# Patient Record
Sex: Male | Born: 1935 | Race: White | Hispanic: No | Smoking: Former smoker
Health system: Southern US, Community
[De-identification: ages and names within clinical notes are randomized; demographics above are authoritative.]

## PROBLEM LIST (undated history)

## (undated) DIAGNOSIS — K5733 Diverticulitis of large intestine without perforation or abscess with bleeding: Secondary | ICD-10-CM

## (undated) DIAGNOSIS — K922 Gastrointestinal hemorrhage, unspecified: Secondary | ICD-10-CM

## (undated) DIAGNOSIS — K219 Gastro-esophageal reflux disease without esophagitis: Secondary | ICD-10-CM

## (undated) DIAGNOSIS — N189 Chronic kidney disease, unspecified: Secondary | ICD-10-CM

## (undated) DIAGNOSIS — N183 Chronic kidney disease, stage 3 unspecified: Secondary | ICD-10-CM

## (undated) DIAGNOSIS — I2510 Atherosclerotic heart disease of native coronary artery without angina pectoris: Secondary | ICD-10-CM

## (undated) DIAGNOSIS — N400 Enlarged prostate without lower urinary tract symptoms: Secondary | ICD-10-CM

## (undated) DIAGNOSIS — J690 Pneumonitis due to inhalation of food and vomit: Secondary | ICD-10-CM

## (undated) DIAGNOSIS — N184 Chronic kidney disease, stage 4 (severe): Secondary | ICD-10-CM

## (undated) DIAGNOSIS — E785 Hyperlipidemia, unspecified: Secondary | ICD-10-CM

## (undated) DIAGNOSIS — J449 Chronic obstructive pulmonary disease, unspecified: Secondary | ICD-10-CM

## (undated) DIAGNOSIS — I219 Acute myocardial infarction, unspecified: Secondary | ICD-10-CM

## (undated) DIAGNOSIS — I2699 Other pulmonary embolism without acute cor pulmonale: Secondary | ICD-10-CM

## (undated) DIAGNOSIS — J8410 Pulmonary fibrosis, unspecified: Secondary | ICD-10-CM

## (undated) DIAGNOSIS — R1312 Dysphagia, oropharyngeal phase: Secondary | ICD-10-CM

## (undated) DIAGNOSIS — Z905 Acquired absence of kidney: Secondary | ICD-10-CM

## (undated) DIAGNOSIS — N179 Acute kidney failure, unspecified: Secondary | ICD-10-CM

## (undated) DIAGNOSIS — F329 Major depressive disorder, single episode, unspecified: Secondary | ICD-10-CM

## (undated) DIAGNOSIS — F32A Depression, unspecified: Secondary | ICD-10-CM

## (undated) DIAGNOSIS — D62 Acute posthemorrhagic anemia: Secondary | ICD-10-CM

## (undated) DIAGNOSIS — M75101 Unspecified rotator cuff tear or rupture of right shoulder, not specified as traumatic: Secondary | ICD-10-CM

## (undated) HISTORY — DX: Chronic kidney disease, stage 4 (severe): N18.4

## (undated) HISTORY — PX: TONSILLECTOMY AND ADENOIDECTOMY: SHX28

## (undated) HISTORY — DX: Acquired absence of kidney: Z90.5

## (undated) HISTORY — PX: SHOULDER SURGERY: SHX246

## (undated) HISTORY — PX: APPENDECTOMY: SHX54

## (undated) HISTORY — PX: NEPHRECTOMY: SHX65

## (undated) HISTORY — PX: CIRCUMCISION: SUR203

## (undated) HISTORY — PX: KNEE SURGERY: SHX244

## (undated) HISTORY — PX: INGUINAL HERNIA REPAIR: SUR1180

## (undated) HISTORY — DX: Gastrointestinal hemorrhage, unspecified: K92.2

## (undated) HISTORY — DX: Pulmonary fibrosis, unspecified: J84.10

## (undated) HISTORY — DX: Diverticulitis of large intestine without perforation or abscess with bleeding: K57.33

## (undated) HISTORY — PX: COLONOSCOPY: SHX174

## (undated) HISTORY — DX: Dysphagia, oropharyngeal phase: R13.12

## (undated) HISTORY — DX: Acute kidney failure, unspecified: N17.9

## (undated) HISTORY — DX: Acute posthemorrhagic anemia: D62

## (undated) HISTORY — PX: OTHER SURGICAL HISTORY: SHX169

## (undated) HISTORY — DX: Unspecified rotator cuff tear or rupture of right shoulder, not specified as traumatic: M75.101

## (undated) HISTORY — PX: CHOLECYSTECTOMY: SHX55

## (undated) HISTORY — DX: Chronic kidney disease, stage 3 unspecified: N18.30

## (undated) HISTORY — DX: Acute myocardial infarction, unspecified: I21.9

## (undated) HISTORY — DX: Chronic kidney disease, stage 3 (moderate): N18.3

---

## 2015-06-10 ENCOUNTER — Inpatient Hospital Stay (HOSPITAL_COMMUNITY)
Admission: EM | Admit: 2015-06-10 | Discharge: 2015-06-12 | DRG: 392 | Disposition: A | Discharge status: Home Health | Payer: Medicare Other | Attending: Internal Medicine | Admitting: Internal Medicine

## 2015-06-10 ENCOUNTER — Encounter (HOSPITAL_COMMUNITY): Payer: Self-pay | Admitting: Emergency Medicine

## 2015-06-10 ENCOUNTER — Emergency Department (HOSPITAL_COMMUNITY): Payer: Medicare Other

## 2015-06-10 DIAGNOSIS — F329 Major depressive disorder, single episode, unspecified: Secondary | ICD-10-CM | POA: Diagnosis present

## 2015-06-10 DIAGNOSIS — N183 Chronic kidney disease, stage 3 unspecified: Secondary | ICD-10-CM | POA: Diagnosis present

## 2015-06-10 DIAGNOSIS — Z8701 Personal history of pneumonia (recurrent): Secondary | ICD-10-CM

## 2015-06-10 DIAGNOSIS — N189 Chronic kidney disease, unspecified: Secondary | ICD-10-CM

## 2015-06-10 DIAGNOSIS — I714 Abdominal aortic aneurysm, without rupture: Secondary | ICD-10-CM | POA: Diagnosis present

## 2015-06-10 DIAGNOSIS — K219 Gastro-esophageal reflux disease without esophagitis: Secondary | ICD-10-CM | POA: Diagnosis present

## 2015-06-10 DIAGNOSIS — Z86711 Personal history of pulmonary embolism: Secondary | ICD-10-CM

## 2015-06-10 DIAGNOSIS — E785 Hyperlipidemia, unspecified: Secondary | ICD-10-CM | POA: Diagnosis present

## 2015-06-10 DIAGNOSIS — N179 Acute kidney failure, unspecified: Secondary | ICD-10-CM

## 2015-06-10 DIAGNOSIS — Z7902 Long term (current) use of antithrombotics/antiplatelets: Secondary | ICD-10-CM

## 2015-06-10 DIAGNOSIS — E2740 Unspecified adrenocortical insufficiency: Secondary | ICD-10-CM | POA: Diagnosis present

## 2015-06-10 DIAGNOSIS — R109 Unspecified abdominal pain: Principal | ICD-10-CM | POA: Diagnosis present

## 2015-06-10 DIAGNOSIS — F32A Depression, unspecified: Secondary | ICD-10-CM | POA: Diagnosis present

## 2015-06-10 DIAGNOSIS — E872 Acidosis: Secondary | ICD-10-CM | POA: Diagnosis present

## 2015-06-10 DIAGNOSIS — N184 Chronic kidney disease, stage 4 (severe): Secondary | ICD-10-CM | POA: Diagnosis present

## 2015-06-10 DIAGNOSIS — R112 Nausea with vomiting, unspecified: Secondary | ICD-10-CM | POA: Diagnosis not present

## 2015-06-10 DIAGNOSIS — Z7901 Long term (current) use of anticoagulants: Secondary | ICD-10-CM

## 2015-06-10 DIAGNOSIS — N400 Enlarged prostate without lower urinary tract symptoms: Secondary | ICD-10-CM | POA: Diagnosis present

## 2015-06-10 DIAGNOSIS — J449 Chronic obstructive pulmonary disease, unspecified: Secondary | ICD-10-CM | POA: Diagnosis present

## 2015-06-10 DIAGNOSIS — I2782 Chronic pulmonary embolism: Secondary | ICD-10-CM | POA: Diagnosis present

## 2015-06-10 DIAGNOSIS — I2699 Other pulmonary embolism without acute cor pulmonale: Secondary | ICD-10-CM

## 2015-06-10 DIAGNOSIS — I951 Orthostatic hypotension: Secondary | ICD-10-CM | POA: Diagnosis present

## 2015-06-10 DIAGNOSIS — I2510 Atherosclerotic heart disease of native coronary artery without angina pectoris: Secondary | ICD-10-CM | POA: Diagnosis present

## 2015-06-10 DIAGNOSIS — Z955 Presence of coronary angioplasty implant and graft: Secondary | ICD-10-CM

## 2015-06-10 HISTORY — DX: Hyperlipidemia, unspecified: E78.5

## 2015-06-10 HISTORY — DX: Benign prostatic hyperplasia without lower urinary tract symptoms: N40.0

## 2015-06-10 HISTORY — DX: Chronic kidney disease, unspecified: N18.9

## 2015-06-10 HISTORY — DX: Pneumonitis due to inhalation of food and vomit: J69.0

## 2015-06-10 HISTORY — DX: Other pulmonary embolism without acute cor pulmonale: I26.99

## 2015-06-10 HISTORY — DX: Major depressive disorder, single episode, unspecified: F32.9

## 2015-06-10 HISTORY — DX: Chronic obstructive pulmonary disease, unspecified: J44.9

## 2015-06-10 HISTORY — DX: Atherosclerotic heart disease of native coronary artery without angina pectoris: I25.10

## 2015-06-10 HISTORY — DX: Gastro-esophageal reflux disease without esophagitis: K21.9

## 2015-06-10 HISTORY — DX: Depression, unspecified: F32.A

## 2015-06-10 MED ORDER — SODIUM CHLORIDE 0.9 % IV BOLUS (SEPSIS)
500.0000 mL | Freq: Once | INTRAVENOUS | Status: AC
  Administered 2015-06-11: 500 mL via INTRAVENOUS

## 2015-06-10 NOTE — ED Provider Notes (Addendum)
CSN: [id number]     Arrival date & time 06/10/15  2240 History  By signing my name below, I, Leonard Shepard, attest that this documentation has been prepared under the direction and in the presence of [name], MD . Electronically Signed: Rowan Shepard, Scribe. 06/10/2015. 11:28 PM.   Chief Complaint  Patient presents with  . Nausea  . Emesis   The history is provided by the patient and a relative. No language interpreter was used.   HPI Comments:  Leonard Shepard is a 82 y.o. male with PMHx of COPD, CAD and aspiration pneumonia who presents to the Emergency Department via EMS complaining of sudden onset episodes of nausea and vomiting this afternoon while watching TV. Pt reports associated mild abdominal pain with palpation by EMS, light-headedness. No chest tightness. Family reports sudden onset diaphoresis and chills. He notes multiple similar episodes in the past year in which he has aspirated vomit resulting in PNA. Family also reports one sudden onset hypotensive episode with near syncope in the past year. Pt had a stent placed 4 years ago with blockage of 30%. He currently takes Warfarin. Pt denies room-spinning dizziness, chest pain, current nausea, current abdominal pain or diarrhea.  Past Medical History  Diagnosis Date  . COPD (chronic obstructive pulmonary disease) ([place])   . Aspiration pneumonia ([place])    History reviewed. No pertinent past surgical history. No family history on file. Social History  Substance Use Topics  . Smoking status: Never Smoker   . Smokeless tobacco: None  . Alcohol Use: No    Review of Systems  Constitutional: Positive for chills and diaphoresis.  Respiratory: Negative for chest tightness.   Cardiovascular: Negative for chest pain.  Gastrointestinal: Positive for nausea and vomiting. Negative for abdominal pain and diarrhea.  Neurological: Positive for light-headedness. Negative for dizziness.  All other systems reviewed and are  negative.  Allergies  Aclidinium bromide  Home Medications   Prior to Admission medications   Medication Sig Start Date End Date Taking? Authorizing Provider  clopidogrel (PLAVIX) 75 MG tablet Take 75 mg by mouth daily.   Yes Historical Provider, MD  colesevelam (WELCHOL) 625 MG tablet Take by mouth 2 (two) times daily with a meal.   Yes Historical Provider, MD  fenofibrate 160 MG tablet Take 160 mg by mouth daily.   Yes Historical Provider, MD  finasteride (PROSCAR) 5 MG tablet Take 5 mg by mouth daily.   Yes Historical Provider, MD  fludrocortisone (FLORINEF) 0.1 MG tablet Take 0.1 mg by mouth daily.   Yes Historical Provider, MD  Fluticasone-Salmeterol (ADVAIR) 250-50 MCG/DOSE AEPB Inhale 1 puff into the lungs 2 (two) times daily.   Yes Historical Provider, MD  metoprolol tartrate (LOPRESSOR) 25 MG tablet Take 12.5 mg by mouth 2 (two) times daily.    Yes Historical Provider, MD  nizatidine (AXID) 150 MG capsule Take 300 mg by mouth at bedtime.   Yes Historical Provider, MD  pantoprazole (PROTONIX) 40 MG tablet Take 40 mg by mouth daily.   Yes Historical Provider, MD  sertraline (ZOLOFT) 50 MG tablet Take 50 mg by mouth daily.   Yes Historical Provider, MD  simvastatin (ZOCOR) 40 MG tablet Take 40 mg by mouth daily.   Yes Historical Provider, MD  warfarin (COUMADIN) 4 MG tablet Take 4 mg by mouth daily. 4 mg on Monday & Friday 2 mg on all other days   Yes Historical Provider, MD   BP 118/72 mmHg  Pulse 87  Temp(Src) 97.9 F (  36.6 C)  Resp 23  SpO2 92% Physical Exam  Constitutional: He is oriented to person, place, and time. He appears well-developed and well-nourished.  Elderly, no acute distress  HENT:  Head: Normocephalic and atraumatic.  Mouth/Throat: Oropharynx is clear and moist.  Eyes: EOM are normal. Pupils are equal, round, and reactive to light.  Cardiovascular: Normal rate, regular rhythm and normal heart sounds.   No murmur heard. Pulmonary/Chest: Effort normal and  breath sounds normal. No respiratory distress. He has no wheezes. He has no rales.  Abdominal: Soft. Bowel sounds are normal. There is no tenderness. There is no rebound and no guarding.  Musculoskeletal:  Trace bilateral lower extremity edema  Neurological: He is alert and oriented to person, place, and time.  Cranial nerves II through XII intact, 5 out of 5 strength in all 4 extremities, no dysmetria to finger-nose-finger, no drift  Skin: Skin is warm and dry.  Psychiatric: He has a normal mood and affect.  Nursing note and vitals reviewed.   ED Course  Procedures  DIAGNOSTIC STUDIES:  Oxygen Saturation is 92% on RA, adequate by my interpretation.    COORDINATION OF CARE:  11:12 PM Will administer fluids and order chest x-ray, blood work, and UA. Discussed treatment plan with pt at bedside and pt agreed to plan.  Labs Review Labs Reviewed  CBC WITH DIFFERENTIAL/PLATELET - Abnormal; Notable for the following:    WBC 15.4 (*)    RBC 4.12 (*)    Hemoglobin 11.8 (*)    HCT 35.7 (*)    RDW 16.0 (*)    Neutro Abs 13.1 (*)    All other components within normal limits  COMPREHENSIVE METABOLIC PANEL - Abnormal; Notable for the following:    Glucose, Bld 119 (*)    BUN 34 (*)    Creatinine, Ser 2.34 (*)    GFR calc non Af Amer 23 (*)    GFR calc Af Amer 26 (*)    All other components within normal limits  BRAIN NATRIURETIC PEPTIDE - Abnormal; Notable for the following:    B Natriuretic Peptide 225.1 (*)    All other components within normal limits  PROTIME-INR - Abnormal; Notable for the following:    Prothrombin Time 24.9 (*)    INR 2.37 (*)    All other components within normal limits  LIPASE, BLOOD  URINALYSIS, ROUTINE W REFLEX MICROSCOPIC (NOT AT [facility])  I-STAT TROPOININ, ED    Imaging Review Ct Abdomen Pelvis Wo Contrast  06/11/2015  CLINICAL DATA:  Abdominal pain and nausea.  Leukocytosis. EXAM: CT ABDOMEN AND PELVIS WITHOUT CONTRAST TECHNIQUE: Multidetector CT  imaging of the abdomen and pelvis was performed following the standard protocol without IV contrast. COMPARISON:  None. FINDINGS: There are unremarkable unenhanced appearances of the liver, spleen, pancreas and adrenals. There is cholecystectomy. The bile ducts are unremarkable. There is right nephrectomy. The left kidney is grossly unremarkable, with several presumed parapelvic cysts. There is no urinary calculus. Left ureter is unremarkable. Urinary bladder is remarkable only for a few small diverticula. Bowel is unremarkable.  Mild colonic diverticulosis noted. There is a 3.5 cm infrarenal abdominal aortic aneurysm. No acute inflammatory changes are evident in the abdomen or pelvis. There is no adenopathy. There is no ascites. There is no acute finding in the lower chest. There is severe fibrosis and honeycombing in the basilar periphery. No significant skeletal lesion is evident. There is moderately severe degenerative lumbar disc disease. IMPRESSION: 1. No acute findings are evident in  the abdomen or pelvis. 2. Diverticulosis 3. 3.5 cm infrarenal abdominal aortic aneurysm 4. Severe fibrosis and honeycombing in the lung bases Electronically Signed   By: Andreas Newport M.D.   On: 06/11/2015 03:14   Dg Chest 2 View  06/10/2015  CLINICAL DATA:  Nausea and vomiting.  Worsening dyspnea. EXAM: CHEST  2 VIEW COMPARISON:  None. FINDINGS: There is moderate hyperinflation. Emphysematous changes are present in the upper lobes. Fibrotic appearing interstitial coarsening is present in the bases. No alveolar consolidation. No effusions. Normal pulmonary vasculature. IMPRESSION: Emphysematous and fibrotic appearing changes. No consolidation or effusion. Electronically Signed   By: Andreas Newport M.D.   On: 06/10/2015 23:09   I have personally reviewed and evaluated these images and lab results as part of my medical decision-making.   EKG Interpretation   Date/Time:  Sunday June 10 2015 23:52:40 EDT Ventricular  Rate:  90 PR Interval:  181 QRS Duration: 100 QT Interval:  381 QTC Calculation: 466 R Axis:   -28 Text Interpretation:  Sinus rhythm Borderline left axis deviation  Confirmed by HORTON  MD, COURTNEY ([id number]) on 06/11/2015 12:09:19 AM      MDM   Final diagnoses:  Non-intractable vomiting with nausea, vomiting of unspecified type  Orthostasis    Patient presents with vomiting and dizziness. Nontoxic on exam. He has never been seen in our system before. Initial vital signs notable for blood pressure 97/64. Was orthostatic. EKG is nonischemic. Basic labwork obtained and notable for leukocytosis to 15.4 and creatinine 2.34. Unknown baseline.  Son states that they noted that his creatinine is elevated almost 3. He was given 500 mL of fluid. CT scan of the abdomen and pelvis is largely reassuring. He does have an infrarenal abdominal aortic aneurysm.  Urinalysis pending. Does not appear septic at this time; however, does have a leukocytosis that is nonspecific. While the etiology of his vomiting is unknown at this time, given his orthostasis and hypotension, will admit for observation and further evaluation.  After history, exam, and medical workup I feel the patient has been appropriately medically screened and is safe for discharge home. Pertinent diagnoses were discussed with the patient. Patient was given return precautions.  I personally performed the services described in this documentation, which was scribed in my presence. The recorded information has been reviewed and is accurate.   [name], MD 06/11/15 [place], MD 06/11/15 [phone]

## 2015-06-10 NOTE — ED Notes (Signed)
Bed: WA06 Expected date:  Expected time:  Means of arrival:  Comments: EMS 78M N/V

## 2015-06-10 NOTE — ED Notes (Signed)
Patient presents from home via EMS for N/V x2 hours. Family reports wanting patient worked up for increased SOB with exertion. History of COPD, aspiration pneumonia. A&O.  Last VS: 128/68, 90hr, 22 resp, 94%ra, cbg 139.

## 2015-06-10 NOTE — ED Notes (Signed)
NT made two unsuccessful attempts to draw blood. informed the nurse.

## 2015-06-11 ENCOUNTER — Emergency Department (HOSPITAL_COMMUNITY): Payer: Medicare Other

## 2015-06-11 ENCOUNTER — Inpatient Hospital Stay (HOSPITAL_COMMUNITY): Payer: Medicare Other

## 2015-06-11 ENCOUNTER — Encounter (HOSPITAL_COMMUNITY): Payer: Self-pay | Admitting: Internal Medicine

## 2015-06-11 DIAGNOSIS — J449 Chronic obstructive pulmonary disease, unspecified: Secondary | ICD-10-CM | POA: Diagnosis present

## 2015-06-11 DIAGNOSIS — I2510 Atherosclerotic heart disease of native coronary artery without angina pectoris: Secondary | ICD-10-CM | POA: Diagnosis present

## 2015-06-11 DIAGNOSIS — N183 Chronic kidney disease, stage 3 unspecified: Secondary | ICD-10-CM | POA: Diagnosis present

## 2015-06-11 DIAGNOSIS — F329 Major depressive disorder, single episode, unspecified: Secondary | ICD-10-CM | POA: Diagnosis present

## 2015-06-11 DIAGNOSIS — A419 Sepsis, unspecified organism: Secondary | ICD-10-CM

## 2015-06-11 DIAGNOSIS — K219 Gastro-esophageal reflux disease without esophagitis: Secondary | ICD-10-CM | POA: Diagnosis present

## 2015-06-11 DIAGNOSIS — N189 Chronic kidney disease, unspecified: Secondary | ICD-10-CM

## 2015-06-11 DIAGNOSIS — R112 Nausea with vomiting, unspecified: Secondary | ICD-10-CM | POA: Diagnosis present

## 2015-06-11 DIAGNOSIS — I2782 Chronic pulmonary embolism: Secondary | ICD-10-CM | POA: Diagnosis present

## 2015-06-11 DIAGNOSIS — R1084 Generalized abdominal pain: Secondary | ICD-10-CM | POA: Diagnosis not present

## 2015-06-11 DIAGNOSIS — Z955 Presence of coronary angioplasty implant and graft: Secondary | ICD-10-CM | POA: Diagnosis not present

## 2015-06-11 DIAGNOSIS — R109 Unspecified abdominal pain: Secondary | ICD-10-CM | POA: Diagnosis present

## 2015-06-11 DIAGNOSIS — I951 Orthostatic hypotension: Secondary | ICD-10-CM | POA: Diagnosis present

## 2015-06-11 DIAGNOSIS — Z8701 Personal history of pneumonia (recurrent): Secondary | ICD-10-CM | POA: Diagnosis not present

## 2015-06-11 DIAGNOSIS — N184 Chronic kidney disease, stage 4 (severe): Secondary | ICD-10-CM

## 2015-06-11 DIAGNOSIS — E785 Hyperlipidemia, unspecified: Secondary | ICD-10-CM | POA: Diagnosis present

## 2015-06-11 DIAGNOSIS — N400 Enlarged prostate without lower urinary tract symptoms: Secondary | ICD-10-CM

## 2015-06-11 DIAGNOSIS — Z7902 Long term (current) use of antithrombotics/antiplatelets: Secondary | ICD-10-CM | POA: Diagnosis not present

## 2015-06-11 DIAGNOSIS — Z7901 Long term (current) use of anticoagulants: Secondary | ICD-10-CM | POA: Diagnosis not present

## 2015-06-11 DIAGNOSIS — Z86711 Personal history of pulmonary embolism: Secondary | ICD-10-CM | POA: Diagnosis not present

## 2015-06-11 DIAGNOSIS — F32A Depression, unspecified: Secondary | ICD-10-CM | POA: Diagnosis present

## 2015-06-11 DIAGNOSIS — E2740 Unspecified adrenocortical insufficiency: Secondary | ICD-10-CM | POA: Diagnosis present

## 2015-06-11 DIAGNOSIS — I2699 Other pulmonary embolism without acute cor pulmonale: Secondary | ICD-10-CM

## 2015-06-11 DIAGNOSIS — E872 Acidosis: Secondary | ICD-10-CM | POA: Diagnosis present

## 2015-06-11 DIAGNOSIS — I714 Abdominal aortic aneurysm, without rupture: Secondary | ICD-10-CM | POA: Diagnosis present

## 2015-06-11 LAB — CBC WITH DIFFERENTIAL/PLATELET
BASOS PCT: 0 %
Basophils Absolute: 0 10*3/uL (ref 0.0–0.1)
EOS PCT: 0 %
Eosinophils Absolute: 0 10*3/uL (ref 0.0–0.7)
HEMATOCRIT: 35.7 % — AB (ref 39.0–52.0)
HEMOGLOBIN: 11.8 g/dL — AB (ref 13.0–17.0)
LYMPHS ABS: 1.5 10*3/uL (ref 0.7–4.0)
Lymphocytes Relative: 10 %
MCH: 28.6 pg (ref 26.0–34.0)
MCHC: 33.1 g/dL (ref 30.0–36.0)
MCV: 86.7 fL (ref 78.0–100.0)
MONO ABS: 0.8 10*3/uL (ref 0.1–1.0)
Monocytes Relative: 5 %
NEUTROS ABS: 13.1 10*3/uL — AB (ref 1.7–7.7)
Neutrophils Relative %: 85 %
Platelets: 279 10*3/uL (ref 150–400)
RBC: 4.12 MIL/uL — ABNORMAL LOW (ref 4.22–5.81)
RDW: 16 % — AB (ref 11.5–15.5)
WBC Morphology: INCREASED
WBC: 15.4 10*3/uL — ABNORMAL HIGH (ref 4.0–10.5)

## 2015-06-11 LAB — COMPREHENSIVE METABOLIC PANEL
ALBUMIN: 3.5 g/dL (ref 3.5–5.0)
ALK PHOS: 46 U/L (ref 38–126)
ALT: 16 U/L — AB (ref 17–63)
ALT: 17 U/L (ref 17–63)
ANION GAP: 10 (ref 5–15)
AST: 31 U/L (ref 15–41)
AST: 33 U/L (ref 15–41)
Albumin: 3.5 g/dL (ref 3.5–5.0)
Alkaline Phosphatase: 42 U/L (ref 38–126)
Anion gap: 9 (ref 5–15)
BILIRUBIN TOTAL: 1 mg/dL (ref 0.3–1.2)
BUN: 35 mg/dL — AB (ref 6–20)
BUN: 34 mg/dL — AB (ref 6–20)
CALCIUM: 9.2 mg/dL (ref 8.9–10.3)
CHLORIDE: 109 mmol/L (ref 101–111)
CO2: 22 mmol/L (ref 22–32)
CO2: 23 mmol/L (ref 22–32)
CREATININE: 2.4 mg/dL — AB (ref 0.61–1.24)
Calcium: 8.8 mg/dL — ABNORMAL LOW (ref 8.9–10.3)
Chloride: 111 mmol/L (ref 101–111)
Creatinine, Ser: 2.34 mg/dL — ABNORMAL HIGH (ref 0.61–1.24)
GFR calc Af Amer: 26 mL/min — ABNORMAL LOW (ref >60)
GFR calc non Af Amer: 22 mL/min — ABNORMAL LOW (ref >60)
GFR, EST AFRICAN AMERICAN: 26 mL/min — AB (ref >60)
GFR, EST NON AFRICAN AMERICAN: 23 mL/min — AB (ref >60)
GLUCOSE: 111 mg/dL — AB (ref 65–99)
Glucose, Bld: 119 mg/dL — ABNORMAL HIGH (ref 65–99)
POTASSIUM: 4.2 mmol/L (ref 3.5–5.1)
Potassium: 4.2 mmol/L (ref 3.5–5.1)
SODIUM: 141 mmol/L (ref 135–145)
Sodium: 143 mmol/L (ref 135–145)
TOTAL PROTEIN: 6.5 g/dL (ref 6.5–8.1)
Total Bilirubin: 1.2 mg/dL (ref 0.3–1.2)
Total Protein: 6.2 g/dL — ABNORMAL LOW (ref 6.5–8.1)

## 2015-06-11 LAB — CBC
HCT: 34.9 % — ABNORMAL LOW (ref 39.0–52.0)
Hemoglobin: 11.4 g/dL — ABNORMAL LOW (ref 13.0–17.0)
MCH: 29.1 pg (ref 26.0–34.0)
MCHC: 32.7 g/dL (ref 30.0–36.0)
MCV: 89 fL (ref 78.0–100.0)
PLATELETS: 285 10*3/uL (ref 150–400)
RBC: 3.92 MIL/uL — AB (ref 4.22–5.81)
RDW: 16.4 % — ABNORMAL HIGH (ref 11.5–15.5)
WBC: 13.9 10*3/uL — ABNORMAL HIGH (ref 4.0–10.5)

## 2015-06-11 LAB — SODIUM, URINE, RANDOM: SODIUM UR: 96 mmol/L

## 2015-06-11 LAB — PROCALCITONIN: PROCALCITONIN: 0.76 ng/mL

## 2015-06-11 LAB — URINALYSIS, ROUTINE W REFLEX MICROSCOPIC
BILIRUBIN URINE: NEGATIVE
Glucose, UA: NEGATIVE mg/dL
Hgb urine dipstick: NEGATIVE
Ketones, ur: NEGATIVE mg/dL
Leukocytes, UA: NEGATIVE
NITRITE: NEGATIVE
PH: 5.5 (ref 5.0–8.0)
Protein, ur: NEGATIVE mg/dL
SPECIFIC GRAVITY, URINE: 1.015 (ref 1.005–1.030)

## 2015-06-11 LAB — GLUCOSE, CAPILLARY: Glucose-Capillary: 108 mg/dL — ABNORMAL HIGH (ref 65–99)

## 2015-06-11 LAB — LIPASE, BLOOD: LIPASE: 36 U/L (ref 11–51)

## 2015-06-11 LAB — LACTIC ACID, PLASMA: Lactic Acid, Venous: 1.2 mmol/L (ref 0.5–2.0)

## 2015-06-11 LAB — I-STAT TROPONIN, ED: TROPONIN I, POC: 0.02 ng/mL (ref 0.00–0.08)

## 2015-06-11 LAB — CREATININE, URINE, RANDOM: Creatinine, Urine: 129.93 mg/dL

## 2015-06-11 LAB — CORTISOL-AM, BLOOD: CORTISOL - AM: 19.6 ug/dL (ref 6.7–22.6)

## 2015-06-11 LAB — PROTIME-INR
INR: 2.23 — ABNORMAL HIGH (ref 0.00–1.49)
INR: 2.37 — ABNORMAL HIGH (ref 0.00–1.49)
Prothrombin Time: 23.7 seconds — ABNORMAL HIGH (ref 11.6–15.2)
Prothrombin Time: 24.9 seconds — ABNORMAL HIGH (ref 11.6–15.2)

## 2015-06-11 LAB — BRAIN NATRIURETIC PEPTIDE: B NATRIURETIC PEPTIDE 5: 225.1 pg/mL — AB (ref 0.0–100.0)

## 2015-06-11 LAB — APTT: aPTT: 42 seconds — ABNORMAL HIGH (ref 24–37)

## 2015-06-11 MED ORDER — SODIUM CHLORIDE 0.9 % IV BOLUS (SEPSIS): 500.0000 mL | Freq: Once | INTRAVENOUS | Status: DC

## 2015-06-11 MED ORDER — IOHEXOL 300 MG/ML  SOLN
50.0000 mL | Freq: Once | INTRAMUSCULAR | Status: AC | PRN
  Administered 2015-06-11: 50 mL via ORAL

## 2015-06-11 MED ORDER — ONDANSETRON HCL 4 MG PO TABS: 4.0000 mg | ORAL_TABLET | Freq: Four times a day (QID) | ORAL | Status: DC | PRN

## 2015-06-11 MED ORDER — PREDNISONE 5 MG PO TABS
5.0000 mg | ORAL_TABLET | Freq: Two times a day (BID) | ORAL | Status: DC
  Administered 2015-06-11 – 2015-06-12 (×2): 5 mg via ORAL
  Filled 2015-06-11 (×2): qty 1

## 2015-06-11 MED ORDER — ZOLPIDEM TARTRATE 5 MG PO TABS
5.0000 mg | ORAL_TABLET | Freq: Once | ORAL | Status: AC
  Administered 2015-06-11: 5 mg via ORAL
  Filled 2015-06-11: qty 1

## 2015-06-11 MED ORDER — HYDROCORTISONE NA SUCCINATE PF 100 MG IJ SOLR
50.0000 mg | Freq: Three times a day (TID) | INTRAMUSCULAR | Status: DC
  Administered 2015-06-11: 50 mg via INTRAVENOUS
  Filled 2015-06-11: qty 2

## 2015-06-11 MED ORDER — SODIUM CHLORIDE 0.9 % IV BOLUS (SEPSIS): 2500.0000 mL | Freq: Once | INTRAVENOUS | Status: DC

## 2015-06-11 MED ORDER — FINASTERIDE 5 MG PO TABS
5.0000 mg | ORAL_TABLET | Freq: Every day | ORAL | Status: DC
  Administered 2015-06-11 – 2015-06-12 (×2): 5 mg via ORAL
  Filled 2015-06-11 (×2): qty 1

## 2015-06-11 MED ORDER — GUAIFENESIN ER 600 MG PO TB12: 600.0000 mg | ORAL_TABLET | Freq: Two times a day (BID) | ORAL | Status: DC | PRN

## 2015-06-11 MED ORDER — WARFARIN - PHARMACIST DOSING INPATIENT: Freq: Every day | Status: DC

## 2015-06-11 MED ORDER — CLOPIDOGREL BISULFATE 75 MG PO TABS
75.0000 mg | ORAL_TABLET | Freq: Every day | ORAL | Status: DC
  Administered 2015-06-11 – 2015-06-12 (×2): 75 mg via ORAL
  Filled 2015-06-11 (×2): qty 1

## 2015-06-11 MED ORDER — MOMETASONE FURO-FORMOTEROL FUM 200-5 MCG/ACT IN AERO
2.0000 | INHALATION_SPRAY | Freq: Two times a day (BID) | RESPIRATORY_TRACT | Status: DC
  Administered 2015-06-11 (×2): 2 via RESPIRATORY_TRACT
  Filled 2015-06-11 (×2): qty 8.8

## 2015-06-11 MED ORDER — WARFARIN SODIUM 4 MG PO TABS
4.0000 mg | ORAL_TABLET | Freq: Once | ORAL | Status: AC
  Administered 2015-06-11: 4 mg via ORAL
  Filled 2015-06-11: qty 1

## 2015-06-11 MED ORDER — SODIUM CHLORIDE 0.9% FLUSH
3.0000 mL | Freq: Two times a day (BID) | INTRAVENOUS | Status: DC
  Administered 2015-06-11: 3 mL via INTRAVENOUS

## 2015-06-11 MED ORDER — FENOFIBRATE 160 MG PO TABS
160.0000 mg | ORAL_TABLET | Freq: Every day | ORAL | Status: DC
  Administered 2015-06-11 – 2015-06-12 (×2): 160 mg via ORAL
  Filled 2015-06-11 (×2): qty 1

## 2015-06-11 MED ORDER — ONDANSETRON HCL 4 MG/2ML IJ SOLN: 4.0000 mg | Freq: Four times a day (QID) | INTRAMUSCULAR | Status: DC | PRN

## 2015-06-11 MED ORDER — SIMVASTATIN 40 MG PO TABS
40.0000 mg | ORAL_TABLET | Freq: Every day | ORAL | Status: DC
  Administered 2015-06-11 – 2015-06-12 (×2): 40 mg via ORAL
  Filled 2015-06-11 (×2): qty 1

## 2015-06-11 MED ORDER — CETYLPYRIDINIUM CHLORIDE 0.05 % MT LIQD
7.0000 mL | Freq: Two times a day (BID) | OROMUCOSAL | Status: DC
  Administered 2015-06-11 – 2015-06-12 (×3): 7 mL via OROMUCOSAL

## 2015-06-11 MED ORDER — ACETAMINOPHEN 650 MG RE SUPP: 650.0000 mg | Freq: Four times a day (QID) | RECTAL | Status: DC | PRN

## 2015-06-11 MED ORDER — SODIUM CHLORIDE 0.9 % IV SOLN
INTRAVENOUS | Status: DC
  Administered 2015-06-11: 06:00:00 via INTRAVENOUS

## 2015-06-11 MED ORDER — METOPROLOL TARTRATE 25 MG PO TABS
12.5000 mg | ORAL_TABLET | Freq: Two times a day (BID) | ORAL | Status: DC
  Administered 2015-06-11 – 2015-06-12 (×3): 12.5 mg via ORAL
  Filled 2015-06-11 (×3): qty 1

## 2015-06-11 MED ORDER — PANTOPRAZOLE SODIUM 40 MG PO TBEC
40.0000 mg | DELAYED_RELEASE_TABLET | Freq: Every day | ORAL | Status: DC
  Administered 2015-06-11 – 2015-06-12 (×2): 40 mg via ORAL
  Filled 2015-06-11 (×2): qty 1

## 2015-06-11 MED ORDER — SERTRALINE HCL 50 MG PO TABS
50.0000 mg | ORAL_TABLET | Freq: Every day | ORAL | Status: DC
  Administered 2015-06-11 – 2015-06-12 (×2): 50 mg via ORAL
  Filled 2015-06-11 (×2): qty 1

## 2015-06-11 MED ORDER — COLESEVELAM HCL 625 MG PO TABS
625.0000 mg | ORAL_TABLET | Freq: Two times a day (BID) | ORAL | Status: DC
  Administered 2015-06-11 – 2015-06-12 (×3): 625 mg via ORAL
  Filled 2015-06-11 (×5): qty 1

## 2015-06-11 MED ORDER — PREDNISONE 5 MG PO TABS: 10.0000 mg | ORAL_TABLET | Freq: Every day | ORAL | Status: DC

## 2015-06-11 MED ORDER — FAMOTIDINE 20 MG PO TABS
20.0000 mg | ORAL_TABLET | Freq: Every day | ORAL | Status: DC
Start: 2015-06-11 — End: 2015-06-12
  Administered 2015-06-11 – 2015-06-12 (×2): 20 mg via ORAL
  Filled 2015-06-11 (×2): qty 1

## 2015-06-11 MED ORDER — ACETAMINOPHEN 325 MG PO TABS
650.0000 mg | ORAL_TABLET | Freq: Four times a day (QID) | ORAL | Status: DC | PRN
  Administered 2015-06-11: 650 mg via ORAL
  Filled 2015-06-11: qty 2

## 2015-06-11 MED ORDER — MORPHINE SULFATE (PF) 2 MG/ML IV SOLN: 1.0000 mg | INTRAVENOUS | Status: DC | PRN

## 2015-06-11 MED ORDER — ALBUTEROL SULFATE (2.5 MG/3ML) 0.083% IN NEBU: 2.5000 mg | INHALATION_SOLUTION | RESPIRATORY_TRACT | Status: DC | PRN

## 2015-06-11 MED ORDER — FLUDROCORTISONE ACETATE 0.1 MG PO TABS
0.1000 mg | ORAL_TABLET | Freq: Every day | ORAL | Status: DC
  Administered 2015-06-11 – 2015-06-12 (×2): 0.1 mg via ORAL
  Filled 2015-06-11 (×2): qty 1

## 2015-06-11 NOTE — Progress Notes (Signed)
ANTICOAGULATION CONSULT NOTE - Follow Up Consult  Pharmacy Consult for coumadin Indication: hx PE  Allergies  Allergen Reactions  . Aclidinium Bromide Itching and Other (See Comments)    Throat irritation    Patient Measurements: Height: 6' (182.9 cm) Weight: 195 lb 8 oz (88.678 kg) IBW/kg (Calculated) : 77.6 Heparin Dosing Weight:   Vital Signs: Temp: 98.2 F (36.8 C) (04/10 0454) Temp Source: Oral (04/10 0454) BP: 145/77 mmHg (04/10 0454) Pulse Rate: 65 (04/10 1005)  Labs:  Recent Labs  06/10/15 2358 06/11/15 0546 06/11/15 0548  HGB 11.8* 11.4*  --   HCT 35.7* 34.9*  --   PLT 279 285  --   APTT  --   --  42*  LABPROT 24.9* 23.7*  --   INR 2.37* 2.23*  --   CREATININE 2.34* 2.40*  --     Estimated Creatinine Clearance: 22 mL/min (by C-G formula based on Cr of 2.4).   Medications:  Home coumadin regimen: 2 mg daily except 4 mg on Mondays and Fridays  Assessment: Patient's a 82 y.o M on coumadin PTA for hx PE presented to the ED on 4/9 with c/o n/v and abdominal pain.  Abd CT with no acute findings.  INR was therapeutic on admission.  Coumadin resumed for hx PE.  Today, 06/11/2015: - INR remains therapeutic at 2.23 - cbc stable - no bleeding documented - drug-drug intxns: home welchol resumed - diet: NPO  Goal of Therapy:  INR 2-3 Monitor platelets by anticoagulation protocol: Yes   Plan:  - coumadin 4 mg PO x1 this morning given at 0930 - daily INR - monitor for s/s bleeding  Saagar Tortorella P 06/11/2015,11:29 AM

## 2015-06-11 NOTE — H&P (Addendum)
[facility] History and Physical  Leonard Shepard [id number] DOB: April 25, 1922 DOA: 06/10/2015  Referring physician: ED physician PCP: Pcp Not In System  Specialists:   Chief Complaint: Nausea, vomiting, abdominal pain  HPI: Leonard Shepard is a 82 y.o. male with PMH of COPD, hyperlipidemia, GERD, depression, COPD, aspiration pneumonia, CAD, S/P stent placement, pulmonary embolism on Coumadin, BPH, left solitary kidney, CKD (unsure which stage), possible adrenal insufficiency (patient is on fludrocortisone, but he cannot tell why), who presents with nausea, vomiting, abdominal pain.  Patient states that he has recently moved from [place] to live with his son's family here. Pt reports that he suddenly started having nausea, vomiting and abdominal pain while watching TV in this PM. His abdominal pain is mild and diffuse. Patient also has light headedness, diaphoresis and chills. Pt denies room-spinning dizziness. Patient has mild cough and mild SOB due to COPD. No fever, chills, chest pain or diarrhea. No unilateral weakness. Patient has mild urinary frequency, no dysuria or burning on urination. Per EDP, family mentioned that pt had multiple similar episodes in the past year in which he has aspirated vomitus resulting in PNA. Family also reports one sudden onset hypotensive episode with near syncope in the past year.   In ED, patient was found to have blood pressure 97/64, INR 2.37, negative urinalysis, negative troponin, BNP 225.1, WBC 15.4, temperature normal, oxygen saturation 92% on room air, mildly tachypnea, no tachycardia, creatinine 2.34, positive orthostatic vital signs per EDP. Chest x-ray showed emphysema, but no infiltration. CT-abd/pelvis showed no acute findings are evident in the abdomen or pelvis; diverticulosis; 3.5 cm infrarenal abdominal aortic aneurysm; severe fibrosis and honeycombing in the lung bases. Patient is admitted to inpatient for further interventional  treatment.  EKG: Independently reviewed. QTC 466, no ischemic change  Where does patient live?   At home  Can patient participate in ADLs? Barely  Review of Systems:   General: no fevers, chills, no changes in body weight, has poor appetite, has fatigue HEENT: no blurry vision, hearing changes or sore throat Pulm: has dyspnea, coughing, no wheezing CV: no chest pain, no palpitations Abd: has nausea, vomiting, abdominal pain, no diarrhea, constipation GU: no dysuria, burning on urination, has increased urinary frequency, no hematuria  Ext: no leg edema Neuro: no unilateral weakness, numbness, or tingling, no vision change or hearing loss Skin: no rash MSK: No muscle spasm, no deformity, no limitation of range of movement in spin Heme: No easy bruising.  Travel history: No recent long distant travel.  Allergy:  Allergies  Allergen Reactions  . Aclidinium Bromide Itching and Other (See Comments)    Throat irritation    Past Medical History  Diagnosis Date  . COPD (chronic obstructive pulmonary disease) ([place])   . Aspiration pneumonia ([place])   . HLD (hyperlipidemia)   . GERD (gastroesophageal reflux disease)   . CAD (coronary artery disease)     s/p stent  . PE (pulmonary embolism)   . BPH (benign prostatic hyperplasia)   . CKD (chronic kidney disease)   . Depression     Past Surgical History  Procedure Laterality Date  . Nephrectomy Right   . Appendectomy      Social History:  reports that he has never smoked. He does not have any smokeless tobacco history on file. He reports that he does not drink alcohol or use illicit drugs.  Family History:  Family History  Problem Relation Age of Onset  . Lung disease Father   . Leukemia Brother  Prior to Admission medications   Medication Sig Start Date End Date Taking? Authorizing Provider  clopidogrel (PLAVIX) 75 MG tablet Take 75 mg by mouth daily.   Yes Historical Provider, MD  colesevelam (WELCHOL) 625 MG tablet  Take by mouth 2 (two) times daily with a meal.   Yes Historical Provider, MD  fenofibrate 160 MG tablet Take 160 mg by mouth daily.   Yes Historical Provider, MD  finasteride (PROSCAR) 5 MG tablet Take 5 mg by mouth daily.   Yes Historical Provider, MD  fludrocortisone (FLORINEF) 0.1 MG tablet Take 0.1 mg by mouth daily.   Yes Historical Provider, MD  Fluticasone-Salmeterol (ADVAIR) 250-50 MCG/DOSE AEPB Inhale 1 puff into the lungs 2 (two) times daily.   Yes Historical Provider, MD  metoprolol tartrate (LOPRESSOR) 25 MG tablet Take 12.5 mg by mouth 2 (two) times daily.    Yes Historical Provider, MD  nizatidine (AXID) 150 MG capsule Take 300 mg by mouth at bedtime.   Yes Historical Provider, MD  pantoprazole (PROTONIX) 40 MG tablet Take 40 mg by mouth daily.   Yes Historical Provider, MD  sertraline (ZOLOFT) 50 MG tablet Take 50 mg by mouth daily.   Yes Historical Provider, MD  simvastatin (ZOCOR) 40 MG tablet Take 40 mg by mouth daily.   Yes Historical Provider, MD  warfarin (COUMADIN) 4 MG tablet Take 4 mg by mouth daily. 4 mg on Monday & Friday 2 mg on all other days   Yes Historical Provider, MD    Physical Exam: Filed Vitals:   06/11/15 0106 06/11/15 0200 06/11/15 0400 06/11/15 0454  BP: 121/64 118/72 126/74 145/77  Pulse: 84 87 77 77  Temp:    98.2 F (36.8 C)  TempSrc:    Oral  Resp: 25 23 12    Height:    6' (1.829 m)  Weight:    88.678 kg (195 lb 8 oz)  SpO2: 97% 92% 97% 98%   General: Not in acute distress HEENT:       Eyes: PERRL, EOMI, no scleral icterus.       ENT: No discharge from the ears and nose, no pharynx injection, no tonsillar enlargement.        Neck: No JVD, no bruit, no mass felt. Heme: No neck lymph node enlargement. Cardiac: S1/S2, RRR, No murmurs, No gallops or rubs. Pulm: No rales, wheezing, rhonchi or rubs. Abd: Soft, nondistended, mild tenderness diffusely, no rebound pain, no organomegaly, BS present. Ext: No pitting leg edema bilaterally. 2+DP/PT  pulse bilaterally. Musculoskeletal: No joint deformities, No joint redness or warmth, no limitation of ROM in spin. Skin: No rashes.  Neuro: Alert, oriented X3, cranial nerves II-XII grossly intact, moves all extremities normally. Psych: Patient is not psychotic, no suicidal or hemocidal ideation.  Labs on Admission:  Basic Metabolic Panel:  Recent Labs Lab 06/10/15 2358  NA 143  K 4.2  CL 111  CO2 22  GLUCOSE 119*  BUN 34*  CREATININE 2.34*  CALCIUM 9.2   Liver Function Tests:  Recent Labs Lab 06/10/15 2358  AST 33  ALT 17  ALKPHOS 46  BILITOT 1.0  PROT 6.5  ALBUMIN 3.5    Recent Labs Lab 06/10/15 2358  LIPASE 36   No results for input(s): AMMONIA in the last 168 hours. CBC:  Recent Labs Lab 06/10/15 2358  WBC 15.4*  NEUTROABS 13.1*  HGB 11.8*  HCT 35.7*  MCV 86.7  PLT 279   Cardiac Enzymes: No results for input(s): CKTOTAL, CKMB, CKMBINDEX,  TROPONINI in the last 168 hours.  BNP (last 3 results)  Recent Labs  06/10/15 2358  BNP 225.1*    ProBNP (last 3 results) No results for input(s): PROBNP in the last 8760 hours.  CBG: No results for input(s): GLUCAP in the last 168 hours.  Radiological Exams on Admission: Ct Abdomen Pelvis Wo Contrast  06/11/2015  CLINICAL DATA:  Abdominal pain and nausea.  Leukocytosis. EXAM: CT ABDOMEN AND PELVIS WITHOUT CONTRAST TECHNIQUE: Multidetector CT imaging of the abdomen and pelvis was performed following the standard protocol without IV contrast. COMPARISON:  None. FINDINGS: There are unremarkable unenhanced appearances of the liver, spleen, pancreas and adrenals. There is cholecystectomy. The bile ducts are unremarkable. There is right nephrectomy. The left kidney is grossly unremarkable, with several presumed parapelvic cysts. There is no urinary calculus. Left ureter is unremarkable. Urinary bladder is remarkable only for a few small diverticula. Bowel is unremarkable.  Mild colonic diverticulosis noted. There  is a 3.5 cm infrarenal abdominal aortic aneurysm. No acute inflammatory changes are evident in the abdomen or pelvis. There is no adenopathy. There is no ascites. There is no acute finding in the lower chest. There is severe fibrosis and honeycombing in the basilar periphery. No significant skeletal lesion is evident. There is moderately severe degenerative lumbar disc disease. IMPRESSION: 1. No acute findings are evident in the abdomen or pelvis. 2. Diverticulosis 3. 3.5 cm infrarenal abdominal aortic aneurysm 4. Severe fibrosis and honeycombing in the lung bases Electronically Signed   By: Andreas Newport M.D.   On: 06/11/2015 03:14   Dg Chest 2 View  06/10/2015  CLINICAL DATA:  Nausea and vomiting.  Worsening dyspnea. EXAM: CHEST  2 VIEW COMPARISON:  None. FINDINGS: There is moderate hyperinflation. Emphysematous changes are present in the upper lobes. Fibrotic appearing interstitial coarsening is present in the bases. No alveolar consolidation. No effusions. Normal pulmonary vasculature. IMPRESSION: Emphysematous and fibrotic appearing changes. No consolidation or effusion. Electronically Signed   By: Andreas Newport M.D.   On: 06/10/2015 23:09   US Renal  06/11/2015  CLINICAL DATA:  Acute kidney injury. History RIGHT nephrectomy, chronic kidney disease, hyperlipidemia, aspiration pneumonia. EXAM: RENAL / URINARY TRACT ULTRASOUND COMPLETE COMPARISON:  CT abdomen and pelvis June 11, 2015 at 0301 hours FINDINGS: Right Kidney: Surgically absent. Left Kidney: Length: 14.2 cm. Mildly lobulated contour without discrete mass. Pelviectasis without frank hydronephrosis. Bladder: Appears normal for degree of bladder distention. LEFT ureteral jet present. IMPRESSION: Status post RIGHT nephrectomy with compensatory LEFT nephromegaly. Mild pelviectasis without obstructive uropathy. Electronically Signed   By: Elon Alas M.D.   On: 06/11/2015 04:47    Assessment/Plan Principal Problem:   Nausea &  vomiting Active Problems:   Orthostasis   Sepsis (HCC)   HLD (hyperlipidemia)   GERD (gastroesophageal reflux disease)   CAD (coronary artery disease)   PE (pulmonary embolism)   BPH (benign prostatic hyperplasia)   CKD (chronic kidney disease)   Abdominal pain   COPD (chronic obstructive pulmonary disease) (HCC)   Depression  Nausea, vomiting, abdominal pain: Etiology is not clear. Lipase normal. CT abdomen/pelvis did not show acute findings. Differential diagnoses include viral gastritis and acute adrenal insufficiency.   -Will admit to tele bed -IVF: 3L NS and the 75 cc/h -Solu-Cortef: 50 mg 3 times a day -Check cortisol level -PRN zofran for nausea and morphine -request Medical record  Questionable sepsis: Patient has leukocytosis and tachypnea, meets criteria for sepsis, but no source of infection identified except for possible viral  gastritis. Patient does not have fever. Pending lactate level. Currently hemodynamically stable.  -Hold off antibiotics -Follow-up blood culture and a urine culture  Possible history of adrenal insufficiency: Patient is on fludrocortisone at home, but cannot tell why taking his medications. -Continue fludrocortisone -Started the stress dose of Solu-Cortef -Follow-up cortisol level as above  HLD: Last LDL was not on record -Continue home medications: Welchl, zocor and fenofibrate -Check FLP  GERD: -Protonix and Pepcid  CAD: s/p stent. No CP. Troponin negative -Continue Zocor, metoprolol, and plavix  Hx of PE (pulmonary embolism): on coumadin, INR=2.37. -Continue coumadin per pharm  BPH: stable - Continue Proscar  CKD (chronic kidney disease): unclear stage. Cre is 2.34. -f/u renal fx by BMP -check renal US (pt has left solitary kidney)  COPD: Lungs clear to auscultation -When necessary albuterol nebulizer -Dulera inhaler -When necessary Mucinex for cough -SLP given hx of aspiration pneumonia  Depression: -Zoloft   DVT  ppx: on coumadin  Code Status: Full code Family Communication: None at bed side. Disposition Plan: Admit to inpatient   Date of Service 06/11/2015    Ivor Costa [facility] Pager [phone]  If 7PM-7AM, please contact night-coverage [email or web address] Password [email or web address] 06/11/2015, 5:11 AM

## 2015-06-11 NOTE — Progress Notes (Signed)
Occupational Therapy Evaluation Patient Details Name: Zenon Ruppe MRN: [id number] DOB: 1922/07/30 Today's Date: 06/11/2015    History of Present Illness 82 y.o. male with PMH of COPD, hyperlipidemia, GERD, depression, COPD, aspiration pneumonia, CAD, S/P stent placement, pulmonary embolism on Coumadin, BPH, left solitary kidney, CKD (unsure which stage), possible adrenal insufficiency (patient is on fludrocortisone, but he cannot tell why), who presents with nausea, vomiting, abdominal pain.   Clinical Impression   Patient presents to OT with decreased ADL independence and safety due to the deficits listed below. He will benefit from skilled OT to maximize function and to facilitate a safe discharge. OT will follow.    Follow Up Recommendations  SNF;Supervision/Assistance - 24 hour    Equipment Recommendations  Other (comment) (tbd at next venue of care)    Recommendations for Other Services       Precautions / Restrictions Precautions Precautions: Fall Precaution Comments: reports a fall at home in January/February Restrictions Weight Bearing Restrictions: No      Mobility Bed Mobility Overal bed mobility: Needs Assistance Bed Mobility: Supine to Sit;Sit to Supine     Supine to sit: Min guard;Min assist Sit to supine: Min guard;Min assist   General bed mobility comments: use of bed rails, increased effort  Transfers Overall transfer level: Needs assistance Equipment used: Rolling walker (2 wheeled) Transfers: Sit to/from Stand Sit to Stand: Min assist              Balance                                            ADL Overall ADL's : Needs assistance/impaired Eating/Feeding: Independent;Sitting   Grooming: Set up;Sitting   Upper Body Bathing: Set up;Sitting   Lower Body Bathing: Moderate assistance;Sit to/from stand   Upper Body Dressing : Set up;Sitting   Lower Body Dressing: Moderate assistance;Sit to/from stand   Toilet  Transfer: Minimal assistance           Functional mobility during ADLs: Minimal assistance;Rolling walker General ADL Comments: Patient agreeable to OT/PT evaluation. See orthostatic BPs taken. Unsteady with mobility, even with RW. Patient also had decreased balance as he fatigued with walking. Patient reports ambulation to bathroom earlier today with nursing. Able to don/doff sock with min A. No nausea/vomiting during session.     Vision     Perception     Praxis      Pertinent Vitals/Pain Pain Assessment: No/denies pain     Hand Dominance Right   Extremity/Trunk Assessment Upper Extremity Assessment Upper Extremity Assessment: Overall WFL for tasks assessed   Lower Extremity Assessment Lower Extremity Assessment: Defer to PT evaluation       Communication Communication Communication: No difficulties   Cognition Arousal/Alertness: Awake/alert Behavior During Therapy: WFL for tasks assessed/performed Overall Cognitive Status: Within Functional Limits for tasks assessed                     General Comments       Exercises       Shoulder Instructions      Home Living Family/patient expects to be discharged to:: Private residence Living Arrangements: Children;Other relatives Available Help at Discharge: Family Type of Home: House Home Access: Stairs to enter [employer] of Steps: 10-garage Entrance Stairs-Rails: Left Home Layout: Two level;Able to live on main level with bedroom/bathroom     Bathroom Shower/Tub:  Walk-in shower   Bathroom Toilet: Standard     Home Equipment: Cane - single point;Shower seat - built in;Grab bars - tub/shower   Additional Comments: family stays with patient      Prior Functioning/Environment Level of Independence: Independent with assistive device(s)        Comments: uses cane    OT Diagnosis: Generalized weakness   OT Problem List: Decreased strength;Decreased activity tolerance;Impaired  balance (sitting and/or standing);Decreased safety awareness;Cardiopulmonary status limiting activity   OT Treatment/Interventions: Self-care/ADL training;DME and/or AE instruction;Therapeutic activities;Patient/family education    OT Goals(Current goals can be found in the care plan section) Acute Rehab OT Goals Patient Stated Goal: none stated OT Goal Formulation: With patient Time For Goal Achievement: 06/25/15 Potential to Achieve Goals: Good ADL Goals Pt Will Perform Upper Body Bathing: with modified independence Pt Will Perform Lower Body Bathing: with modified independence Pt Will Perform Upper Body Dressing: with modified independence Pt Will Perform Lower Body Dressing: with modified independence Pt Will Transfer to Toilet: with modified independence Pt Will Perform Toileting - Clothing Manipulation and hygiene: with modified independence  OT Frequency: Min 2X/week   Barriers to D/C:            Co-evaluation PT/OT/SLP Co-Evaluation/Treatment: Yes Reason for Co-Treatment: For patient/therapist safety PT goals addressed during session: Mobility/safety with mobility OT goals addressed during session: ADL's and self-care      End of Session Equipment Utilized During Treatment: Gait belt;Rolling walker Nurse Communication: Mobility status  Activity Tolerance: Patient tolerated treatment well Patient left: in bed;with call bell/phone within reach;with bed alarm set   Time: 1427-1450 OT Time Calculation (min): 23 min Charges:  OT General Charges $OT Visit: 1 Procedure OT Evaluation $OT Eval Moderate Complexity: 1 Procedure G-Codes:    Khamani Daniely A 06/29/2015, 3:17 PM

## 2015-06-11 NOTE — Progress Notes (Addendum)
[facility] PROGRESS NOTE    Progress Note   Haim Kilpela [id number] DOB: 1922-05-23 DOA: 06/10/2015 PCP: Pcp Not In System   Brief Narrative:   Joel Debusk is an 82 y.o. male past medical history of COPD, aspiration pneumonia pulmonary embolism on Coumadin less solitary kidney that comes in for nausea vomiting and abdominal pain.  Assessment/Plan:   Abdominal pain, Nausea & vomiting/leukocytosis/SIRS: Likely due to relative adrenal insufficiency, CT scan of the abdomen and pelvis showed no acute findings, lipase is normal as well as LFTs have come down. We'll KVO IV fluids change his steroids back to oral. Continue hold antibiotics cultures are negative till date he has remained afebrile, leukocytosis improving.  Possible history of adrenal insufficiency: Patient is on Florinef at home but he cannot tell me why he is taking this medication. Code obtain records from PCP, TC steroids IV changing to oral. Orthostatics were positive on admission, he had these episodes before he can get up in the afternoon. And usually during the morning he feels great. He relates dizziness upon standing on occasions at home, that he only happens in the afternoon. He relates that every time he was on steroids for short course he felt great, with a lot of energy and able to do multiple things. Not any fevers, chills nausea vomiting since he was started on the steroids here in the hospital. He has several boluses of ED and was started on Solu-Cortef. And was continue on Florinef.  Hyperlipidemia: No changes were made to his medication.  History of PE: INR therapeutic continue Coumadin per pharmacy.  CAD: Status post stenting patient denies any chest pain or shortness of breath continues Zocor, metoprolol and Plavix.  Chronic kidney disease stage 3-4: He has a solitary kidney, renal ultrasound showed left enlarged kidney, no obstruction.  COPD: Lungs are clear, check a swallowing  evaluation.  Depression: Continue Zoloft.  BPH: Continue Proscar.  DVT Prophylaxis - Lovenox ordered.  Family Communication: son Disposition Plan: Home in am Code Status:     Code Status Orders        Start     Ordered   06/11/15 0413  Full code   Continuous     06/11/15 0414    Code Status History    Date Active Date Inactive Code Status Order ID Comments User Context   This patient has a current code status but no historical code status.        IV Access:    Peripheral IV   Procedures and diagnostic studies:   Ct Abdomen Pelvis Wo Contrast  06/11/2015  CLINICAL DATA:  Abdominal pain and nausea.  Leukocytosis. EXAM: CT ABDOMEN AND PELVIS WITHOUT CONTRAST TECHNIQUE: Multidetector CT imaging of the abdomen and pelvis was performed following the standard protocol without IV contrast. COMPARISON:  None. FINDINGS: There are unremarkable unenhanced appearances of the liver, spleen, pancreas and adrenals. There is cholecystectomy. The bile ducts are unremarkable. There is right nephrectomy. The left kidney is grossly unremarkable, with several presumed parapelvic cysts. There is no urinary calculus. Left ureter is unremarkable. Urinary bladder is remarkable only for a few small diverticula. Bowel is unremarkable.  Mild colonic diverticulosis noted. There is a 3.5 cm infrarenal abdominal aortic aneurysm. No acute inflammatory changes are evident in the abdomen or pelvis. There is no adenopathy. There is no ascites. There is no acute finding in the lower chest. There is severe fibrosis and honeycombing in the basilar periphery. No significant skeletal lesion is evident. There is  moderately severe degenerative lumbar disc disease. IMPRESSION: 1. No acute findings are evident in the abdomen or pelvis. 2. Diverticulosis 3. 3.5 cm infrarenal abdominal aortic aneurysm 4. Severe fibrosis and honeycombing in the lung bases Electronically Signed   By: Andreas Newport M.D.   On: 06/11/2015  03:14   Dg Chest 2 View  06/10/2015  CLINICAL DATA:  Nausea and vomiting.  Worsening dyspnea. EXAM: CHEST  2 VIEW COMPARISON:  None. FINDINGS: There is moderate hyperinflation. Emphysematous changes are present in the upper lobes. Fibrotic appearing interstitial coarsening is present in the bases. No alveolar consolidation. No effusions. Normal pulmonary vasculature. IMPRESSION: Emphysematous and fibrotic appearing changes. No consolidation or effusion. Electronically Signed   By: Andreas Newport M.D.   On: 06/10/2015 23:09   US Renal  06/11/2015  CLINICAL DATA:  Acute kidney injury. History RIGHT nephrectomy, chronic kidney disease, hyperlipidemia, aspiration pneumonia. EXAM: RENAL / URINARY TRACT ULTRASOUND COMPLETE COMPARISON:  CT abdomen and pelvis June 11, 2015 at 0301 hours FINDINGS: Right Kidney: Surgically absent. Left Kidney: Length: 14.2 cm. Mildly lobulated contour without discrete mass. Pelviectasis without frank hydronephrosis. Bladder: Appears normal for degree of bladder distention. LEFT ureteral jet present. IMPRESSION: Status post RIGHT nephrectomy with compensatory LEFT nephromegaly. Mild pelviectasis without obstructive uropathy. Electronically Signed   By: Elon Alas M.D.   On: 06/11/2015 04:47     Medical Consultants:    None.  Anti-Infectives:   Anti-infectives    None      Subjective:    Kelly Splinter he relates he is hungry he is symptom free.  Objective:    Filed Vitals:   06/11/15 0200 06/11/15 0400 06/11/15 0454 06/11/15 1005  BP: 118/72 126/74 145/77   Pulse: 87 77 77 65  Temp:   98.2 F (36.8 C)   TempSrc:   Oral   Resp: 23 12  18   Height:   6' (1.829 m)   Weight:   88.678 kg (195 lb 8 oz)   SpO2: 92% 97% 98% 96%    Intake/Output Summary (Last 24 hours) at 06/11/15 1141 Last data filed at 06/11/15 0416  Gross per 24 hour  Intake      0 ml  Output    200 ml  Net   -200 ml   Filed Weights   06/11/15 0454  Weight: 88.678 kg (195  lb 8 oz)    Exam: Gen:  NAD Cardiovascular:  RRR. Chest and lungs:   CTAB Abdomen:  Abdomen soft, NT/ND, + BS Extremities:  No edema   Data Reviewed:    Labs: Basic Metabolic Panel:  Recent Labs Lab 06/10/15 2358 06/11/15 0546  NA 143 141  K 4.2 4.2  CL 111 109  CO2 22 23  GLUCOSE 119* 111*  BUN 34* 35*  CREATININE 2.34* 2.40*  CALCIUM 9.2 8.8*   GFR Estimated Creatinine Clearance: 22 mL/min (by C-G formula based on Cr of 2.4). Liver Function Tests:  Recent Labs Lab 06/10/15 2358 06/11/15 0546  AST 33 31  ALT 17 16*  ALKPHOS 46 42  BILITOT 1.0 1.2  PROT 6.5 6.2*  ALBUMIN 3.5 3.5    Recent Labs Lab 06/10/15 2358  LIPASE 36   No results for input(s): AMMONIA in the last 168 hours. Coagulation profile  Recent Labs Lab 06/10/15 2358 06/11/15 0546  INR 2.37* 2.23*    CBC:  Recent Labs Lab 06/10/15 2358 06/11/15 0546  WBC 15.4* 13.9*  NEUTROABS 13.1*  --   HGB 11.8* 11.4*  HCT 35.7* 34.9*  MCV 86.7 89.0  PLT 279 285   Cardiac Enzymes: No results for input(s): CKTOTAL, CKMB, CKMBINDEX, TROPONINI in the last 168 hours. BNP (last 3 results) No results for input(s): PROBNP in the last 8760 hours. CBG:  Recent Labs Lab 06/11/15 0741  GLUCAP 108*   D-Dimer: No results for input(s): DDIMER in the last 72 hours. Hgb A1c: No results for input(s): HGBA1C in the last 72 hours. Lipid Profile: No results for input(s): CHOL, HDL, LDLCALC, TRIG, CHOLHDL, LDLDIRECT in the last 72 hours. Thyroid function studies: No results for input(s): TSH, T4TOTAL, T3FREE, THYROIDAB in the last 72 hours.  Invalid input(s): FREET3 Anemia work up: No results for input(s): VITAMINB12, FOLATE, FERRITIN, TIBC, IRON, RETICCTPCT in the last 72 hours. Sepsis Labs:  Recent Labs Lab 06/10/15 2358 06/11/15 0546 06/11/15 0548  PROCALCITON  --   --  0.76  WBC 15.4* 13.9*  --   LATICACIDVEN  --   --  1.2   Microbiology No results found for this or any  previous visit (from the past 240 hour(s)).   Medications:   . antiseptic oral rinse  7 mL Mouth Rinse BID  . clopidogrel  75 mg Oral Q breakfast  . colesevelam  625 mg Oral BID WC  . famotidine  20 mg Oral Daily  . fenofibrate  160 mg Oral Daily  . finasteride  5 mg Oral Daily  . fludrocortisone  0.1 mg Oral Daily  . hydrocortisone sod succinate (SOLU-CORTEF) inj  50 mg Intravenous 3 times per day  . metoprolol tartrate  12.5 mg Oral BID  . mometasone-formoterol  2 puff Inhalation BID  . pantoprazole  40 mg Oral Daily  . sertraline  50 mg Oral Daily  . simvastatin  40 mg Oral Daily  . sodium chloride  2,500 mL Intravenous Once  . sodium chloride  500 mL Intravenous Once  . sodium chloride flush  3 mL Intravenous Q12H  . Warfarin - Pharmacist Dosing Inpatient   Does not apply q1800   Continuous Infusions: . sodium chloride 75 mL/hr at 06/11/15 0557    Time spent: 25 min   LOS: 0 days   Charlynne Cousins  [facility] Pager [phone]  *Please refer to [email or web address], password [email or web address] to get updated schedule on who will round on this patient, as hospitalists switch teams weekly. If 7PM-7AM, please contact night-coverage at [email or web address], password [email or web address] for any overnight needs.  06/11/2015, 11:41 AM

## 2015-06-11 NOTE — Evaluation (Signed)
Clinical/Bedside Swallow Evaluation Patient Details  Name: Leonard Shepard MRN: [id number] Date of Birth: 1923/01/09  Today's Date: 06/11/2015 Time: SLP Start Time (ACUTE ONLY): 1137 SLP Stop Time (ACUTE ONLY): 1200 SLP Time Calculation (min) (ACUTE ONLY): 23 min  Past Medical History:  Past Medical History  Diagnosis Date  . COPD (chronic obstructive pulmonary disease) ([place])   . Aspiration pneumonia ([place])   . HLD (hyperlipidemia)   . GERD (gastroesophageal reflux disease)   . CAD (coronary artery disease)     s/p stent  . PE (pulmonary embolism)   . BPH (benign prostatic hyperplasia)   . CKD (chronic kidney disease)   . Depression    Past Surgical History:  Past Surgical History  Procedure Laterality Date  . Nephrectomy Right   . Appendectomy     HPI:  82 yo male adm to [facility] with N/V.  Pt denies becoming choked on his emesis.  States he has acquired pna from aspirating emesis during previously.  Pt with PMH + for N/V, COPD, diverticulosis, Ultrasound renal - negative- right kidney absent, GERD, COPD, BPH, depression, PE on chronic medication/blood thinner.  Pt was on a PPI PTA.  Pt reports issues with n/v x 3 times within 6 months.  Pt recently relocated from [place] to [facility].  Concern for aspiration present and Md ordered swallow evaluation.  CXR   Emphysematous and fibrotic appearing changes. No consolidation.     Assessment / Plan / Recommendation Clinical Impression  Pt presents with functional oropharyngeal swallow based on clinical swallow evaluation.  Negative CN exam noted and pt's voice was clear throughout intake.  Observed pt self feeding cracker, fruit, coffee, water and applesauce.  Timely swallow noted with clear voice.  Pt reports h/o aspiration pna is due to his nausea/vomiting.    Recommend he consume a regular/thin diet with general aspiration/reflux precautions.  As he does report occasional issues with swallowing pills, reviewed alternative ways to take medications  including with solids.  No SLP follow up indicated.      Aspiration Risk  Mild aspiration risk    Diet Recommendation Regular;Thin liquid   Liquid Administration via: Cup;Straw Medication Administration: Whole meds with liquid Supervision: Patient able to self feed;Intermittent supervision to cue for compensatory strategies Compensations: Slow rate;Small sips/bites    Other  Recommendations   n/a  Follow up Recommendations  None    Frequency and Duration            Prognosis   good     Swallow Study   General Date of Onset: 06/11/15 HPI: 82 yo male adm to [facility] with N/V.  Pt denies becoming choked on his emesis.  States he has acquired pna from aspirating emesis during previously.  Pt with PMH + for N/V, COPD, diverticulosis, Ultrasound renal - negative- right kidney absent, GERD, COPD, BPH, depression, PE on chronic medication/blood thinner.  Pt was on a PPI PTA.  Pt reports issues with n/v x 3 times within 6 months.  Pt recently relocated from [place] to [facility].  Concern for aspiration present and Md ordered swallow evaluation.  CXR   Emphysematous and fibrotic appearing changes. No consolidation.   Type of Study: Bedside Swallow Evaluation Diet Prior to this Study: NPO Temperature Spikes Noted: No Respiratory Status: Room air History of Recent Intubation: No Behavior/Cognition: Alert;Cooperative;Pleasant mood Oral Care Completed by SLP: No Oral Cavity - Dentition: Adequate natural dentition Vision: Functional for self-feeding Self-Feeding Abilities: Able to feed self Patient Positioning: Upright in bed Baseline Vocal  Quality: Normal Volitional Cough: Strong Volitional Swallow: Able to elicit    Oral/Motor/Sensory Function Overall Oral Motor/Sensory Function: Generalized oral weakness (generalized weakness)   Ice Chips Ice chips: Not tested   Thin Liquid Thin Liquid: Within functional limits Presentation: Self Fed;Cup    Nectar Thick Nectar Thick Liquid: Not tested    Honey Thick Honey Thick Liquid: Not tested   Puree Puree: Within functional limits Presentation: Self Fed;Spoon   Solid   GO   Solid: Within functional limits Presentation: Self Fed;Spoon        Claudie Fisherman, [facility] SLP [phone]

## 2015-06-11 NOTE — Evaluation (Signed)
Physical Therapy Evaluation Patient Details Name: Leonard Shepard MRN: [id number] DOB: 11/18/1922 Today's Date: 06/11/2015   History of Present Illness  82 y.o. male with PMH of COPD, hyperlipidemia, GERD, depression, COPD, aspiration pneumonia, CAD, S/P stent placement, pulmonary embolism on Coumadin, BPH, left solitary kidney, CKD (unsure which stage), possible adrenal insufficiency (patient is on fludrocortisone, but he cannot tell why), who presents with nausea, vomiting, abdominal pain.  Clinical Impression  The patient is pleasant, generalized weakness and requires +1 for safe ambulation. BP's did not drop(see doc flow sheets). No family present to discuss DC needs. Pt admitted with above diagnosis. Pt currently with functional limitations due to the deficits listed below (see PT Problem List).  Pt will benefit from skilled PT to increase their independence and safety with mobility to allow discharge to the venue listed below.       Follow Up Recommendations SNF;Supervision/Assistance - 24 hour    Equipment Recommendations  None recommended by PT    Recommendations for Other Services       Precautions / Restrictions Precautions Precautions: Fall Precaution Comments: reports a fall at home in January/February Restrictions Weight Bearing Restrictions: No      Mobility  Bed Mobility Overal bed mobility: Needs Assistance Bed Mobility: Supine to Sit;Sit to Supine     Supine to sit: Min guard;Min assist Sit to supine: Min guard;Min assist   General bed mobility comments: use of bed rails, increased effort  Transfers Overall transfer level: Needs assistance Equipment used: Rolling walker (2 wheeled) Transfers: Sit to/from Stand Sit to Stand: Min assist            Ambulation/Gait Ambulation/Gait assistance: Min assist Ambulation Distance (Feet): 90 Feet Assistive device: Rolling walker (2 wheeled) Gait Pattern/deviations: Step-to pattern;Step-through  pattern;Decreased stride length;Drifts right/left   Gait velocity interpretation: Below normal speed for age/gender General Gait Details: cuesfor safety and for guiding RW  Stairs            Wheelchair Mobility    Modified Rankin (Stroke Patients Only)       Balance Overall balance assessment: History of Falls;Needs assistance         Standing balance support: During functional activity;Bilateral upper extremity supported Standing balance-Leahy Scale: Fair                               Pertinent Vitals/Pain Pain Assessment: No/denies pain    Home Living Family/patient expects to be discharged to:: Private residence Living Arrangements: Children;Other relatives Available Help at Discharge: Family Type of Home: House Home Access: Stairs to enter Entrance Stairs-Rails: Left Entrance Stairs-Number of Steps: 10-garage Home Layout: Two level;Able to live on main level with bedroom/bathroom Home Equipment: Kasandra Knudsen - single point;Shower seat - built in;Grab bars - tub/shower Additional Comments: family stays with patient    Prior Function Level of Independence: Independent with assistive device(s)         Comments: uses cane     Hand Dominance   Dominant Hand: Right    Extremity/Trunk Assessment   Upper Extremity Assessment: Defer to OT evaluation           Lower Extremity Assessment: Generalized weakness      Cervical / Trunk Assessment: Kyphotic  Communication   Communication: No difficulties  Cognition Arousal/Alertness: Awake/alert Behavior During Therapy: WFL for tasks assessed/performed Overall Cognitive Status: Within Functional Limits for tasks assessed  General Comments      Exercises        Assessment/Plan    PT Assessment Patient needs continued PT services  PT Diagnosis Difficulty walking;Generalized weakness   PT Problem List Decreased strength;Decreased activity tolerance;Decreased  mobility;Decreased knowledge of use of DME;Decreased safety awareness;Decreased knowledge of precautions  PT Treatment Interventions DME instruction;Gait training;Stair training;Functional mobility training;Therapeutic activities;Patient/family education   PT Goals (Current goals can be found in the Care Plan section) Acute Rehab PT Goals Patient Stated Goal: none stated PT Goal Formulation: With patient Time For Goal Achievement: 06/25/15 Potential to Achieve Goals: Good    Frequency Min 3X/week   Barriers to discharge Decreased caregiver support      Co-evaluation PT/OT/SLP Co-Evaluation/Treatment: Yes Reason for Co-Treatment: For patient/therapist safety PT goals addressed during session: Mobility/safety with mobility OT goals addressed during session: ADL's and self-care       End of Session Equipment Utilized During Treatment: Gait belt   Patient left: in bed;with SCD's reapplied;with bed alarm set Nurse Communication: Mobility status         Time: 1426-1450 PT Time Calculation (min) (ACUTE ONLY): 24 min   Charges:   PT Evaluation $PT Eval Low Complexity: 1 Procedure     PT G CodesClaretha Cooper 06/11/2015, 3:47 PM Tresa Endo PT [phone]

## 2015-06-11 NOTE — Progress Notes (Signed)
ANTICOAGULATION CONSULT NOTE - Initial Consult  Pharmacy Consult for Warfarin Indication: pulmonary embolus  Allergies  Allergen Reactions  . Aclidinium Bromide Itching and Other (See Comments)    Throat irritation    Patient Measurements:     Vital Signs: Temp: 97.9 F (36.6 C) (04/09 2243) BP: 126/74 mmHg (04/10 0400) Pulse Rate: 77 (04/10 0400)  Labs:  Recent Labs  06/10/15 2358  HGB 11.8*  HCT 35.7*  PLT 279  LABPROT 24.9*  INR 2.37*  CREATININE 2.34*    CrCl cannot be calculated (Unknown ideal weight.).   Medical History: Past Medical History  Diagnosis Date  . COPD (chronic obstructive pulmonary disease) ([place])   . Aspiration pneumonia ([place])   . HLD (hyperlipidemia)   . GERD (gastroesophageal reflux disease)   . CAD (coronary artery disease)     s/p stent  . PE (pulmonary embolism)   . BPH (benign prostatic hyperplasia)   . CKD (chronic kidney disease)     Medications:  Scheduled:  . clopidogrel  75 mg Oral Q breakfast  . colesevelam  625 mg Oral BID WC  . famotidine  20 mg Oral Daily  . fenofibrate  160 mg Oral Daily  . finasteride  5 mg Oral Daily  . fludrocortisone  0.1 mg Oral Daily  . hydrocortisone sod succinate (SOLU-CORTEF) inj  50 mg Intravenous 3 times per day  . metoprolol tartrate  12.5 mg Oral BID  . mometasone-formoterol  2 puff Inhalation BID  . pantoprazole  40 mg Oral Daily  . sertraline  50 mg Oral Daily  . simvastatin  40 mg Oral Daily  . sodium chloride flush  3 mL Intravenous Q12H  . Warfarin - Pharmacist Dosing Inpatient   Does not apply q1800   Infusions:  . sodium chloride    . sodium chloride    . sodium chloride      Assessment:  82 yr male with PMH including CAD and COPD presents with nausea and vomiting.  Patient on warfarin PTA for h/o PE  PTA warfarin dose = 2mg  daily except for 4mg  on Mon/Fri - last dose reported as taken on 06/09/15  Goal of Therapy:  INR 2-3    Plan: Warfarin 4mg  po x 1 dose (will  give dose this AM since did not take dose on 4/9) Check daily INR.  Evon Dejarnett, Toribio Harbour, PharmD 06/11/2015,4:25 AM

## 2015-06-12 DIAGNOSIS — I951 Orthostatic hypotension: Secondary | ICD-10-CM

## 2015-06-12 DIAGNOSIS — J449 Chronic obstructive pulmonary disease, unspecified: Secondary | ICD-10-CM

## 2015-06-12 DIAGNOSIS — R1084 Generalized abdominal pain: Secondary | ICD-10-CM

## 2015-06-12 LAB — GLUCOSE, CAPILLARY: Glucose-Capillary: 97 mg/dL (ref 65–99)

## 2015-06-12 LAB — URINE CULTURE: CULTURE: NO GROWTH

## 2015-06-12 LAB — LIPID PANEL
Cholesterol: 86 mg/dL (ref 0–200)
HDL: 31 mg/dL — AB (ref >40)
LDL CALC: 37 mg/dL (ref 0–99)
TRIGLYCERIDES: 91 mg/dL (ref <150)
Total CHOL/HDL Ratio: 2.8 RATIO
VLDL: 18 mg/dL (ref 0–40)

## 2015-06-12 LAB — PROTIME-INR
INR: 2.51 — ABNORMAL HIGH (ref 0.00–1.49)
PROTHROMBIN TIME: 26 s — AB (ref 11.6–15.2)

## 2015-06-12 MED ORDER — HYDROCORTISONE 10 MG PO TABS: ORAL_TABLET | ORAL | Status: DC

## 2015-06-12 NOTE — Discharge Summary (Signed)
Physician Discharge Summary  Leonard Shepard [id number] DOB: 07/21/1922 DOA: 06/10/2015  PCP: Pcp Not In System  Admit date: 06/10/2015 Discharge date: 06/12/2015  Time spent: 35  minutes  Recommendations for Outpatient Follow-up:  1. Follow-up with EGD as an outpatient in 2-4 weeks.   Discharge Diagnoses:  Principal Problem:   Nausea & vomiting Active Problems:   Orthostasis   HLD (hyperlipidemia)   GERD (gastroesophageal reflux disease)   CAD (coronary artery disease)   PE (pulmonary embolism)   BPH (benign prostatic hyperplasia)   CKD (chronic kidney disease)   Abdominal pain   COPD (chronic obstructive pulmonary disease) (HCC)   Depression   Discharge Condition: stable  Diet recommendation: regular  Filed Weights   06/11/15 0454  Weight: 88.678 kg (195 lb 8 oz)    History of present illness:  82 year old with past medical history of COPD aspiration pneumonia CAD on Coumadin for PE possible adrenal insufficiency on Florinef the comes in for nausea vomiting and abdominal pain and dizziness upon standing.  Hospital Course:  Abdominal pain nausea vomiting/leukocytosis and orthostatic hypotension: CT scan of the abdomen and pelvis show no acute findings, lipase was normal he was started on aggressive IV fluid hydration his lactic acidosis was resolved, and sepsis was ruled out. He was not start empiric antibiotics he remained afebrile cytosis resolved. He was started on IV steroids and his orthostasis nausea and vomiting resolved. He will follow-up with his PCP as an outpatient he was discharged on hydrocortisone as an outpatient.  Possible history of adrenal insufficiency: Patient is on Florinef at home, he was started on empiric steroids as his orthostatics were positive on admission. He related he has been having dizziness upon standing at home that only happens in the afternoon. Patient relates that every time he would get on steroids at home for his COPD he would feel  great with a lot of energy a good appetite and no symptoms. As soon as he was calm office steroids he started feeling weak especially in the afternoons.  Hyperlipidemia: Continue statins.  History of PE: INR therapeutic no changes were made.  Chronic kidney disease stage III at baseline he has a solitary kidney renal ultrasound was done that showed a left large kidney with no obstructions.  Procedures: CT abd and pelvis Abd [place]  Consultations:  none  Discharge Exam: Filed Vitals:   06/11/15 2206 06/12/15 0617  BP: 131/59 129/63  Pulse: 71 56  Temp: 98.8 F (37.1 C) 98 F (36.7 C)  Resp: 18 18    General: A&O x3 Cardiovascular: RRR Respiratory: good air movement CTA B/L  Discharge Instructions   Discharge Instructions    Diet - low sodium heart healthy    Complete by:  As directed      Increase activity slowly    Complete by:  As directed           Current Discharge Medication List    START taking these medications   Details  hydrocortisone (CORTEF) 10 MG tablet Take 1 tablet in the morning and half a tablet at night. Qty: 45 tablet, Refills: 2      CONTINUE these medications which have NOT CHANGED   Details  clopidogrel (PLAVIX) 75 MG tablet Take 75 mg by mouth daily.    colesevelam (WELCHOL) 625 MG tablet Take by mouth 2 (two) times daily with a meal.    fenofibrate 160 MG tablet Take 160 mg by mouth daily.    finasteride (PROSCAR) 5 MG tablet  Take 5 mg by mouth daily.    fludrocortisone (FLORINEF) 0.1 MG tablet Take 0.1 mg by mouth daily.    Fluticasone-Salmeterol (ADVAIR) 250-50 MCG/DOSE AEPB Inhale 1 puff into the lungs 2 (two) times daily.    metoprolol tartrate (LOPRESSOR) 25 MG tablet Take 12.5 mg by mouth 2 (two) times daily.     nizatidine (AXID) 150 MG capsule Take 300 mg by mouth at bedtime.    pantoprazole (PROTONIX) 40 MG tablet Take 40 mg by mouth daily.    sertraline (ZOLOFT) 50 MG tablet Take 50 mg by mouth daily.    simvastatin  (ZOCOR) 40 MG tablet Take 40 mg by mouth daily.    warfarin (COUMADIN) 4 MG tablet Take 2-4 mg by mouth daily. 4 mg on Monday & Friday 2 mg on all other days       Allergies  Allergen Reactions  . Aclidinium Bromide Itching and Other (See Comments)    Throat irritation      The results of significant diagnostics from this hospitalization (including imaging, microbiology, ancillary and laboratory) are listed below for reference.    Significant Diagnostic Studies: Ct Abdomen Pelvis Wo Contrast  06/11/2015  CLINICAL DATA:  Abdominal pain and nausea.  Leukocytosis. EXAM: CT ABDOMEN AND PELVIS WITHOUT CONTRAST TECHNIQUE: Multidetector CT imaging of the abdomen and pelvis was performed following the standard protocol without IV contrast. COMPARISON:  None. FINDINGS: There are unremarkable unenhanced appearances of the liver, spleen, pancreas and adrenals. There is cholecystectomy. The bile ducts are unremarkable. There is right nephrectomy. The left kidney is grossly unremarkable, with several presumed parapelvic cysts. There is no urinary calculus. Left ureter is unremarkable. Urinary bladder is remarkable only for a few small diverticula. Bowel is unremarkable.  Mild colonic diverticulosis noted. There is a 3.5 cm infrarenal abdominal aortic aneurysm. No acute inflammatory changes are evident in the abdomen or pelvis. There is no adenopathy. There is no ascites. There is no acute finding in the lower chest. There is severe fibrosis and honeycombing in the basilar periphery. No significant skeletal lesion is evident. There is moderately severe degenerative lumbar disc disease. IMPRESSION: 1. No acute findings are evident in the abdomen or pelvis. 2. Diverticulosis 3. 3.5 cm infrarenal abdominal aortic aneurysm 4. Severe fibrosis and honeycombing in the lung bases Electronically Signed   By: Andreas Newport M.D.   On: 06/11/2015 03:14   Dg Chest 2 View  06/10/2015  CLINICAL DATA:  Nausea and  vomiting.  Worsening dyspnea. EXAM: CHEST  2 VIEW COMPARISON:  None. FINDINGS: There is moderate hyperinflation. Emphysematous changes are present in the upper lobes. Fibrotic appearing interstitial coarsening is present in the bases. No alveolar consolidation. No effusions. Normal pulmonary vasculature. IMPRESSION: Emphysematous and fibrotic appearing changes. No consolidation or effusion. Electronically Signed   By: Andreas Newport M.D.   On: 06/10/2015 23:09   US Renal  06/11/2015  CLINICAL DATA:  Acute kidney injury. History RIGHT nephrectomy, chronic kidney disease, hyperlipidemia, aspiration pneumonia. EXAM: RENAL / URINARY TRACT ULTRASOUND COMPLETE COMPARISON:  CT abdomen and pelvis June 11, 2015 at 0301 hours FINDINGS: Right Kidney: Surgically absent. Left Kidney: Length: 14.2 cm. Mildly lobulated contour without discrete mass. Pelviectasis without frank hydronephrosis. Bladder: Appears normal for degree of bladder distention. LEFT ureteral jet present. IMPRESSION: Status post RIGHT nephrectomy with compensatory LEFT nephromegaly. Mild pelviectasis without obstructive uropathy. Electronically Signed   By: Elon Alas M.D.   On: 06/11/2015 04:47    Microbiology: No results found for this or  any previous visit (from the past 240 hour(s)).   Labs: Basic Metabolic Panel:  Recent Labs Lab 06/10/15 2358 06/11/15 0546  NA 143 141  K 4.2 4.2  CL 111 109  CO2 22 23  GLUCOSE 119* 111*  BUN 34* 35*  CREATININE 2.34* 2.40*  CALCIUM 9.2 8.8*   Liver Function Tests:  Recent Labs Lab 06/10/15 2358 06/11/15 0546  AST 33 31  ALT 17 16*  ALKPHOS 46 42  BILITOT 1.0 1.2  PROT 6.5 6.2*  ALBUMIN 3.5 3.5    Recent Labs Lab 06/10/15 2358  LIPASE 36   No results for input(s): AMMONIA in the last 168 hours. CBC:  Recent Labs Lab 06/10/15 2358 06/11/15 0546  WBC 15.4* 13.9*  NEUTROABS 13.1*  --   HGB 11.8* 11.4*  HCT 35.7* 34.9*  MCV 86.7 89.0  PLT 279 285   Cardiac  Enzymes: No results for input(s): CKTOTAL, CKMB, CKMBINDEX, TROPONINI in the last 168 hours. BNP: BNP (last 3 results)  Recent Labs  06/10/15 2358  BNP 225.1*    ProBNP (last 3 results) No results for input(s): PROBNP in the last 8760 hours.  CBG:  Recent Labs Lab 06/11/15 0741 06/12/15 0759  GLUCAP 108* 97       Signed:  Charlynne Cousins MD.  [facility] 06/12/2015, 8:36 AM

## 2015-06-12 NOTE — Progress Notes (Signed)
Spoke to pt and son concerning HH, [facility] was selected. Referral given to in house rep.

## 2015-06-12 NOTE — Progress Notes (Signed)
Note pt's PCP is Dr. Anastasia Pall, [address], [address]  [place], [place]. [phone].

## 2015-06-12 NOTE — Progress Notes (Signed)
ANTICOAGULATION CONSULT NOTE - Follow Up Consult  Pharmacy Consult for coumadin Indication: hx PE  Allergies  Allergen Reactions  . Aclidinium Bromide Itching and Other (See Comments)    Throat irritation    Patient Measurements: Height: 6' (182.9 cm) Weight: 195 lb 8 oz (88.678 kg) IBW/kg (Calculated) : 77.6 Heparin Dosing Weight:   Vital Signs: Temp: 98 F (36.7 C) (04/11 0617) Temp Source: Oral (04/11 0617) BP: 129/63 mmHg (04/11 0617) Pulse Rate: 56 (04/11 0617)  Labs:  Recent Labs  06/10/15 2358 06/11/15 0546 06/11/15 0548 06/12/15 0444  HGB 11.8* 11.4*  --   --   HCT 35.7* 34.9*  --   --   PLT 279 285  --   --   APTT  --   --  42*  --   LABPROT 24.9* 23.7*  --  26.0*  INR 2.37* 2.23*  --  2.51*  CREATININE 2.34* 2.40*  --   --     Estimated Creatinine Clearance: 22 mL/min (by C-G formula based on Cr of 2.4).   Medications:  Home coumadin regimen: 2 mg daily except 4 mg on Mondays and Fridays  Assessment: Patient's a 82 y.o M on coumadin PTA for hx PE presented to the ED on 4/9 with c/o n/v and abdominal pain.  Abd CT with no acute findings.  INR was therapeutic on admission.  Coumadin resumed for hx PE.  Today, 06/12/2015: - INR remains therapeutic at 2.23 - cbc stable - no bleeding documented - drug-drug intxns: home welchol resumed - diet: NPO  Goal of Therapy:  INR 2-3 Monitor platelets by anticoagulation protocol: Yes   Plan:  Discharge home today 4/11, no War[facility]in at [facility], to resume Warfarin at home, patient aware. - daily INR - monitor for s/s bleeding  Nyoka Cowden, Abrar Bilton L 06/12/2015,1:04 PM

## 2015-06-16 LAB — CULTURE, BLOOD (ROUTINE X 2)
CULTURE: NO GROWTH
Culture: NO GROWTH

## 2016-11-08 ENCOUNTER — Encounter (HOSPITAL_COMMUNITY): Payer: Self-pay

## 2016-11-08 ENCOUNTER — Inpatient Hospital Stay (HOSPITAL_COMMUNITY)
Admission: EM | Admit: 2016-11-08 | Discharge: 2016-11-12 | DRG: 871 | Disposition: A | Discharge status: Skilled Nursing Facility | Payer: Medicare Other | Attending: Internal Medicine | Admitting: Internal Medicine

## 2016-11-08 ENCOUNTER — Emergency Department (HOSPITAL_COMMUNITY): Payer: Medicare Other

## 2016-11-08 DIAGNOSIS — J90 Pleural effusion, not elsewhere classified: Secondary | ICD-10-CM | POA: Diagnosis present

## 2016-11-08 DIAGNOSIS — J181 Lobar pneumonia, unspecified organism: Secondary | ICD-10-CM

## 2016-11-08 DIAGNOSIS — E2740 Unspecified adrenocortical insufficiency: Secondary | ICD-10-CM | POA: Diagnosis present

## 2016-11-08 DIAGNOSIS — Z8701 Personal history of pneumonia (recurrent): Secondary | ICD-10-CM | POA: Diagnosis not present

## 2016-11-08 DIAGNOSIS — A419 Sepsis, unspecified organism: Secondary | ICD-10-CM | POA: Diagnosis present

## 2016-11-08 DIAGNOSIS — E039 Hypothyroidism, unspecified: Secondary | ICD-10-CM | POA: Diagnosis present

## 2016-11-08 DIAGNOSIS — R34 Anuria and oliguria: Secondary | ICD-10-CM

## 2016-11-08 DIAGNOSIS — J811 Chronic pulmonary edema: Secondary | ICD-10-CM

## 2016-11-08 DIAGNOSIS — J8410 Pulmonary fibrosis, unspecified: Secondary | ICD-10-CM | POA: Diagnosis not present

## 2016-11-08 DIAGNOSIS — I714 Abdominal aortic aneurysm, without rupture: Secondary | ICD-10-CM | POA: Diagnosis present

## 2016-11-08 DIAGNOSIS — R451 Restlessness and agitation: Secondary | ICD-10-CM | POA: Diagnosis not present

## 2016-11-08 DIAGNOSIS — I700 Atherosclerosis of aorta: Secondary | ICD-10-CM | POA: Diagnosis not present

## 2016-11-08 DIAGNOSIS — R1312 Dysphagia, oropharyngeal phase: Secondary | ICD-10-CM | POA: Diagnosis present

## 2016-11-08 DIAGNOSIS — Z8639 Personal history of other endocrine, nutritional and metabolic disease: Secondary | ICD-10-CM | POA: Diagnosis not present

## 2016-11-08 DIAGNOSIS — R159 Full incontinence of feces: Secondary | ICD-10-CM | POA: Diagnosis not present

## 2016-11-08 DIAGNOSIS — Z905 Acquired absence of kidney: Secondary | ICD-10-CM

## 2016-11-08 DIAGNOSIS — R0602 Shortness of breath: Secondary | ICD-10-CM

## 2016-11-08 DIAGNOSIS — N184 Chronic kidney disease, stage 4 (severe): Secondary | ICD-10-CM | POA: Diagnosis present

## 2016-11-08 DIAGNOSIS — E785 Hyperlipidemia, unspecified: Secondary | ICD-10-CM | POA: Diagnosis present

## 2016-11-08 DIAGNOSIS — J918 Pleural effusion in other conditions classified elsewhere: Secondary | ICD-10-CM | POA: Diagnosis not present

## 2016-11-08 DIAGNOSIS — Z79899 Other long term (current) drug therapy: Secondary | ICD-10-CM | POA: Diagnosis not present

## 2016-11-08 DIAGNOSIS — N179 Acute kidney failure, unspecified: Secondary | ICD-10-CM | POA: Diagnosis present

## 2016-11-08 DIAGNOSIS — Z86711 Personal history of pulmonary embolism: Secondary | ICD-10-CM | POA: Diagnosis not present

## 2016-11-08 DIAGNOSIS — N400 Enlarged prostate without lower urinary tract symptoms: Secondary | ICD-10-CM | POA: Diagnosis present

## 2016-11-08 DIAGNOSIS — Z7901 Long term (current) use of anticoagulants: Secondary | ICD-10-CM

## 2016-11-08 DIAGNOSIS — J690 Pneumonitis due to inhalation of food and vomit: Secondary | ICD-10-CM | POA: Diagnosis present

## 2016-11-08 DIAGNOSIS — N183 Chronic kidney disease, stage 3 (moderate): Secondary | ICD-10-CM | POA: Diagnosis not present

## 2016-11-08 DIAGNOSIS — R042 Hemoptysis: Secondary | ICD-10-CM | POA: Diagnosis present

## 2016-11-08 DIAGNOSIS — K219 Gastro-esophageal reflux disease without esophagitis: Secondary | ICD-10-CM | POA: Diagnosis present

## 2016-11-08 DIAGNOSIS — J449 Chronic obstructive pulmonary disease, unspecified: Secondary | ICD-10-CM | POA: Diagnosis present

## 2016-11-08 DIAGNOSIS — K5900 Constipation, unspecified: Secondary | ICD-10-CM | POA: Diagnosis present

## 2016-11-08 DIAGNOSIS — J189 Pneumonia, unspecified organism: Secondary | ICD-10-CM

## 2016-11-08 DIAGNOSIS — Z955 Presence of coronary angioplasty implant and graft: Secondary | ICD-10-CM

## 2016-11-08 DIAGNOSIS — I129 Hypertensive chronic kidney disease with stage 1 through stage 4 chronic kidney disease, or unspecified chronic kidney disease: Secondary | ICD-10-CM | POA: Diagnosis not present

## 2016-11-08 DIAGNOSIS — I2510 Atherosclerotic heart disease of native coronary artery without angina pectoris: Secondary | ICD-10-CM | POA: Diagnosis present

## 2016-11-08 DIAGNOSIS — R0902 Hypoxemia: Secondary | ICD-10-CM | POA: Diagnosis present

## 2016-11-08 DIAGNOSIS — F329 Major depressive disorder, single episode, unspecified: Secondary | ICD-10-CM | POA: Diagnosis present

## 2016-11-08 DIAGNOSIS — J439 Emphysema, unspecified: Secondary | ICD-10-CM | POA: Diagnosis not present

## 2016-11-08 DIAGNOSIS — Z7952 Long term (current) use of systemic steroids: Secondary | ICD-10-CM | POA: Diagnosis not present

## 2016-11-08 LAB — URINALYSIS, ROUTINE W REFLEX MICROSCOPIC
Bilirubin Urine: NEGATIVE
Glucose, UA: NEGATIVE mg/dL
Hgb urine dipstick: NEGATIVE
Ketones, ur: NEGATIVE mg/dL
Leukocytes, UA: NEGATIVE
Nitrite: NEGATIVE
Protein, ur: NEGATIVE mg/dL
Specific Gravity, Urine: 1.014 (ref 1.005–1.030)
pH: 5 (ref 5.0–8.0)

## 2016-11-08 LAB — CBC WITH DIFFERENTIAL/PLATELET
Basophils Absolute: 0 10*3/uL (ref 0.0–0.1)
Basophils Relative: 0 %
Eosinophils Absolute: 0.1 10*3/uL (ref 0.0–0.7)
Eosinophils Relative: 1 %
HCT: 39.6 % (ref 39.0–52.0)
Hemoglobin: 12.6 g/dL — ABNORMAL LOW (ref 13.0–17.0)
Lymphocytes Relative: 20 %
Lymphs Abs: 2.1 10*3/uL (ref 0.7–4.0)
MCH: 29.2 pg (ref 26.0–34.0)
MCHC: 31.8 g/dL (ref 30.0–36.0)
MCV: 91.7 fL (ref 78.0–100.0)
Monocytes Absolute: 0.2 10*3/uL (ref 0.1–1.0)
Monocytes Relative: 2 %
Neutro Abs: 8 10*3/uL — ABNORMAL HIGH (ref 1.7–7.7)
Neutrophils Relative %: 77 %
Platelets: 256 10*3/uL (ref 150–400)
RBC: 4.32 MIL/uL (ref 4.22–5.81)
RDW: 15.4 % (ref 11.5–15.5)
WBC: 10.4 10*3/uL (ref 4.0–10.5)

## 2016-11-08 LAB — COMPREHENSIVE METABOLIC PANEL
ALT: 17 U/L (ref 17–63)
AST: 35 U/L (ref 15–41)
Albumin: 3.3 g/dL — ABNORMAL LOW (ref 3.5–5.0)
Alkaline Phosphatase: 56 U/L (ref 38–126)
Anion gap: 11 (ref 5–15)
BUN: 42 mg/dL — ABNORMAL HIGH (ref 6–20)
CO2: 20 mmol/L — ABNORMAL LOW (ref 22–32)
Calcium: 8.7 mg/dL — ABNORMAL LOW (ref 8.9–10.3)
Chloride: 108 mmol/L (ref 101–111)
Creatinine, Ser: 2.6 mg/dL — ABNORMAL HIGH (ref 0.61–1.24)
GFR calc Af Amer: 23 mL/min — ABNORMAL LOW (ref >60)
GFR calc non Af Amer: 20 mL/min — ABNORMAL LOW (ref >60)
Glucose, Bld: 100 mg/dL — ABNORMAL HIGH (ref 65–99)
Potassium: 3.6 mmol/L (ref 3.5–5.1)
Sodium: 139 mmol/L (ref 135–145)
Total Bilirubin: 0.9 mg/dL (ref 0.3–1.2)
Total Protein: 6.1 g/dL — ABNORMAL LOW (ref 6.5–8.1)

## 2016-11-08 LAB — TYPE AND SCREEN
ABO/RH(D): A POS
Antibody Screen: NEGATIVE

## 2016-11-08 LAB — PROTIME-INR
INR: 1.83
Prothrombin Time: 21 seconds — ABNORMAL HIGH (ref 11.4–15.2)

## 2016-11-08 LAB — STREP PNEUMONIAE URINARY ANTIGEN: Strep Pneumo Urinary Antigen: NEGATIVE

## 2016-11-08 LAB — LACTIC ACID, PLASMA
LACTIC ACID, VENOUS: 1.9 mmol/L (ref 0.5–1.9)
LACTIC ACID, VENOUS: 1.9 mmol/L (ref 0.5–1.9)
Lactic Acid, Venous: 2.2 mmol/L (ref 0.5–1.9)
Lactic Acid, Venous: 2.1 mmol/L (ref 0.5–1.9)

## 2016-11-08 LAB — PROCALCITONIN: Procalcitonin: 13.29 ng/mL

## 2016-11-08 LAB — MRSA PCR SCREENING: MRSA by PCR: NEGATIVE

## 2016-11-08 LAB — LIPASE, BLOOD: Lipase: 56 U/L — ABNORMAL HIGH (ref 11–51)

## 2016-11-08 LAB — ABO/RH: ABO/RH(D): A POS

## 2016-11-08 MED ORDER — TECHNETIUM TC 99M DIETHYLENETRIAME-PENTAACETIC ACID
29.6000 | Freq: Once | INTRAVENOUS | Status: AC
  Administered 2016-11-08: 29.6 via RESPIRATORY_TRACT

## 2016-11-08 MED ORDER — FLUDROCORTISONE ACETATE 0.1 MG PO TABS
0.1000 mg | ORAL_TABLET | Freq: Every day | ORAL | Status: DC
  Administered 2016-11-08 – 2016-11-12 (×5): 0.1 mg via ORAL
  Filled 2016-11-08 (×6): qty 1

## 2016-11-08 MED ORDER — DEXTROSE 5 % IV SOLN
1.0000 g | INTRAVENOUS | Status: DC
  Filled 2016-11-08: qty 10

## 2016-11-08 MED ORDER — ONDANSETRON HCL 4 MG PO TABS: 4.0000 mg | ORAL_TABLET | Freq: Four times a day (QID) | ORAL | Status: DC | PRN

## 2016-11-08 MED ORDER — SODIUM CHLORIDE 0.9% FLUSH: 3.0000 mL | Freq: Two times a day (BID) | INTRAVENOUS | Status: DC

## 2016-11-08 MED ORDER — DEXTROSE 5 % IV SOLN
500.0000 mg | Freq: Once | INTRAVENOUS | Status: AC
  Administered 2016-11-08: 500 mg via INTRAVENOUS
  Filled 2016-11-08: qty 500

## 2016-11-08 MED ORDER — IOPAMIDOL (ISOVUE-300) INJECTION 61%
INTRAVENOUS | Status: AC
  Filled 2016-11-08: qty 30

## 2016-11-08 MED ORDER — SODIUM CHLORIDE 0.9 % IV SOLN
INTRAVENOUS | Status: DC
  Administered 2016-11-08 – 2016-11-09 (×2): via INTRAVENOUS

## 2016-11-08 MED ORDER — SODIUM CHLORIDE 0.9 % IV SOLN: 250.0000 mL | INTRAVENOUS | Status: DC | PRN

## 2016-11-08 MED ORDER — DEXTROSE 5 % IV SOLN: 500.0000 mg | Freq: Once | INTRAVENOUS | Status: DC

## 2016-11-08 MED ORDER — HYDROCORTISONE 5 MG PO TABS
5.0000 mg | ORAL_TABLET | Freq: Every day | ORAL | Status: DC
  Administered 2016-11-08 – 2016-11-11 (×4): 5 mg via ORAL
  Filled 2016-11-08 (×6): qty 1

## 2016-11-08 MED ORDER — ONDANSETRON HCL 4 MG/2ML IJ SOLN
4.0000 mg | Freq: Once | INTRAMUSCULAR | Status: AC
  Administered 2016-11-08: 4 mg via INTRAVENOUS
  Filled 2016-11-08: qty 2

## 2016-11-08 MED ORDER — POLYETHYLENE GLYCOL 3350 17 G PO PACK: 17.0000 g | PACK | Freq: Every day | ORAL | Status: DC | PRN

## 2016-11-08 MED ORDER — PANTOPRAZOLE SODIUM 40 MG IV SOLR
40.0000 mg | Freq: Two times a day (BID) | INTRAVENOUS | Status: DC
  Administered 2016-11-08 – 2016-11-10 (×5): 40 mg via INTRAVENOUS
  Filled 2016-11-08 (×6): qty 40

## 2016-11-08 MED ORDER — SODIUM CHLORIDE 0.9 % IV SOLN
80.0000 mg | Freq: Once | INTRAVENOUS | Status: AC
  Administered 2016-11-08: 80 mg via INTRAVENOUS
  Filled 2016-11-08: qty 80

## 2016-11-08 MED ORDER — SERTRALINE HCL 50 MG PO TABS
50.0000 mg | ORAL_TABLET | Freq: Every day | ORAL | Status: DC
  Administered 2016-11-09 – 2016-11-12 (×4): 50 mg via ORAL
  Filled 2016-11-08 (×5): qty 1

## 2016-11-08 MED ORDER — PRAVASTATIN SODIUM 20 MG PO TABS
20.0000 mg | ORAL_TABLET | Freq: Every day | ORAL | Status: DC
  Administered 2016-11-09 – 2016-11-12 (×4): 20 mg via ORAL
  Filled 2016-11-08 (×4): qty 1

## 2016-11-08 MED ORDER — ACETAMINOPHEN 325 MG PO TABS
650.0000 mg | ORAL_TABLET | Freq: Four times a day (QID) | ORAL | Status: DC | PRN
  Administered 2016-11-08: 650 mg via ORAL
  Filled 2016-11-08: qty 2

## 2016-11-08 MED ORDER — SENNA 8.6 MG PO TABS
1.0000 | ORAL_TABLET | Freq: Every day | ORAL | Status: DC
  Administered 2016-11-09 – 2016-11-11 (×3): 8.6 mg via ORAL
  Filled 2016-11-08 (×2): qty 1

## 2016-11-08 MED ORDER — POLYETHYLENE GLYCOL 3350 17 G PO PACK
17.0000 g | PACK | Freq: Every day | ORAL | Status: DC
  Administered 2016-11-09 – 2016-11-10 (×2): 17 g via ORAL
  Filled 2016-11-08 (×2): qty 1

## 2016-11-08 MED ORDER — HYDROCORTISONE 10 MG PO TABS
10.0000 mg | ORAL_TABLET | Freq: Every day | ORAL | Status: DC
  Administered 2016-11-09 – 2016-11-12 (×4): 10 mg via ORAL
  Filled 2016-11-08 (×6): qty 1

## 2016-11-08 MED ORDER — TECHNETIUM TO 99M ALBUMIN AGGREGATED
4.1000 | Freq: Once | INTRAVENOUS | Status: AC | PRN
  Administered 2016-11-08: 4.1 via INTRAVENOUS

## 2016-11-08 MED ORDER — ONDANSETRON HCL 4 MG/2ML IJ SOLN: 4.0000 mg | Freq: Four times a day (QID) | INTRAMUSCULAR | Status: DC | PRN

## 2016-11-08 MED ORDER — ACETAMINOPHEN 650 MG RE SUPP
650.0000 mg | Freq: Four times a day (QID) | RECTAL | Status: DC | PRN
  Administered 2016-11-08: 650 mg via RECTAL
  Filled 2016-11-08: qty 1

## 2016-11-08 MED ORDER — SODIUM CHLORIDE 0.9 % IV BOLUS (SEPSIS)
1000.0000 mL | Freq: Once | INTRAVENOUS | Status: AC
  Administered 2016-11-08: 1000 mL via INTRAVENOUS

## 2016-11-08 MED ORDER — DEXTROSE 5 % IV SOLN
1.0000 g | Freq: Once | INTRAVENOUS | Status: AC
  Administered 2016-11-08: 1 g via INTRAVENOUS
  Filled 2016-11-08: qty 10

## 2016-11-08 MED ORDER — DEXTROSE 5 % IV SOLN: 1.0000 g | Freq: Once | INTRAVENOUS | Status: DC

## 2016-11-08 MED ORDER — DEXTROSE 5 % IV SOLN
500.0000 mg | INTRAVENOUS | Status: DC
  Filled 2016-11-08: qty 500

## 2016-11-08 MED ORDER — LEVOTHYROXINE SODIUM 25 MCG PO TABS
25.0000 ug | ORAL_TABLET | Freq: Every day | ORAL | Status: DC
  Administered 2016-11-09 – 2016-11-12 (×4): 25 ug via ORAL
  Filled 2016-11-08 (×4): qty 1

## 2016-11-08 MED ORDER — SODIUM CHLORIDE 0.9 % IV SOLN: INTRAVENOUS | Status: DC

## 2016-11-08 MED ORDER — SODIUM CHLORIDE 0.9 % IV BOLUS (SEPSIS)
1000.0000 mL | Freq: Once | INTRAVENOUS | Status: AC
Start: 2016-11-08 — End: 2016-11-08
  Administered 2016-11-08: 1000 mL via INTRAVENOUS

## 2016-11-08 MED ORDER — FINASTERIDE 5 MG PO TABS
5.0000 mg | ORAL_TABLET | Freq: Every day | ORAL | Status: DC
  Administered 2016-11-09 – 2016-11-12 (×4): 5 mg via ORAL
  Filled 2016-11-08 (×5): qty 1

## 2016-11-08 MED ORDER — LACTATED RINGERS IV BOLUS (SEPSIS)
1000.0000 mL | Freq: Once | INTRAVENOUS | Status: AC
  Administered 2016-11-08: 1000 mL via INTRAVENOUS

## 2016-11-08 MED ORDER — SODIUM CHLORIDE 0.9% FLUSH
3.0000 mL | INTRAVENOUS | Status: DC | PRN
Start: 2016-11-08 — End: 2016-11-08

## 2016-11-08 NOTE — Progress Notes (Signed)
S: Paged by nursing at 10:09 PM that the patient was complaining of back pain, and was restless. Went to bedside to evaluate the patient and his back pain had improved with application of hot compresses.  Paged again by nursing at 11:10 PM because the patient was complaining of difficulty breathing. Went to bed side to evaluate the patient. He stated he was having difficulty breathing and wanted to drink water. Nursing also stated he hadn't made any urine since ~3PM. Bladder scan was performed while in the room and showed 120 ml of fluid. O: Vitals:   11/08/16 2000 11/08/16 2343  BP: (!) 101/54 (!) 91/48  Pulse: 88 84  Resp:  (!) 30  Temp:  98.7 F (37.1 C)  SpO2: 94% 96%   General: Laying in bed, increased work of breathing HEENT: [place]/AT, EOMI, no scleral icterus, PERRL Cardiac: RRR, No R/M/G appreciated Pulm: increased work of breathing, Bibasilar crackles bilaterally, decreased breath sounds on right Abd: soft, non tender, non distended, BS normal Ext: extremities well perfused, no peripheral edema Neuro: alert and oriented X3, cranial nerves II-XII grossly intact  A/P Vital signs unchanged, sating high 90s on 4L of nasal canula. Not acutely worsening at this time, but may have some pulmonary edema due to aggressive IVF resuscitation.  -Decrease fluid rate to 100 cc/hr -Repeat CXR -monitor vital signs closely -continue w/ ceftriaxone and azithro

## 2016-11-08 NOTE — ED Notes (Signed)
Dr. Wilson Singer made aware of BP 77/49. New order for 1L LR bolus received.

## 2016-11-08 NOTE — ED Provider Notes (Signed)
[facility] DEPT Provider Note   CSN: [id number] Arrival date & time: 11/08/16  0757     History   Chief Complaint Chief Complaint  Patient presents with  . Hematemesis    HPI Leonard Shepard is a 83 y.o. male.  HPI   89yM with respiratory distress. Began coughing in the early morning hours. I initially thought he may have aspirated but family reports he was coughing and having frankly bloody sputum prior to the vomiting. Vomitus in ED looks bilious. Son reports temperature of 101 this morning. Yesterday he seemed to be in his usual state of health other than he didn't want to go out to eat with in the evening because he felt very tired.    On warfarin for hx of PE. INR on Tuesday was 4 something. Advised to hold coumadin a day and dose adjusted. He denies acute pain. Has chills. Feels SOB when coughing. No unusual leg pain or swelling. No orthopnea.   On EMS arrival he was coughing forcefully and vomiting. Not clear to them if hemoptysis or hematemesis. He was tachypneic and diaphoretic. Tried placing him on BiPAP but had to discontinue because began retching again   Past Medical History:  Diagnosis Date  . Aspiration pneumonia ([place])   . BPH (benign prostatic hyperplasia)   . CAD (coronary artery disease)    s/p stent  . CKD (chronic kidney disease)   . COPD (chronic obstructive pulmonary disease) ([place])   . Depression   . GERD (gastroesophageal reflux disease)   . HLD (hyperlipidemia)   . PE (pulmonary embolism)     Patient Active Problem List   Diagnosis Date Noted  . Orthostasis 06/11/2015  . Sepsis ([place]) 06/11/2015  . Nausea & vomiting 06/11/2015  . Abdominal pain 06/11/2015  . HLD (hyperlipidemia)   . GERD (gastroesophageal reflux disease)   . CAD (coronary artery disease)   . PE (pulmonary embolism)   . BPH (benign prostatic hyperplasia)   . CKD (chronic kidney disease)   . COPD (chronic obstructive pulmonary disease) ([place])   . Depression     Past  Surgical History:  Procedure Laterality Date  . APPENDECTOMY    . NEPHRECTOMY Right        Home Medications    Prior to Admission medications   Medication Sig Start Date End Date Taking? Authorizing Provider  clopidogrel (PLAVIX) 75 MG tablet Take 75 mg by mouth daily.    [provider]  colesevelam (WELCHOL) 625 MG tablet Take by mouth 2 (two) times daily with a meal.    [provider]  fenofibrate 160 MG tablet Take 160 mg by mouth daily.    [provider]  finasteride (PROSCAR) 5 MG tablet Take 5 mg by mouth daily.    [provider]  fludrocortisone (FLORINEF) 0.1 MG tablet Take 0.1 mg by mouth daily.    [provider]  Fluticasone-Salmeterol (ADVAIR) 250-50 MCG/DOSE AEPB Inhale 1 puff into the lungs 2 (two) times daily.    [provider]  hydrocortisone (CORTEF) 10 MG tablet Take 1 tablet in the morning and half a tablet at night. 06/12/15   Charlynne Cousins, MD  metoprolol tartrate (LOPRESSOR) 25 MG tablet Take 12.5 mg by mouth 2 (two) times daily.     [provider]  nizatidine (AXID) 150 MG capsule Take 300 mg by mouth at bedtime.    [provider]  pantoprazole (PROTONIX) 40 MG tablet Take 40 mg by mouth daily.  [provider]  sertraline (ZOLOFT) 50 MG tablet Take 50 mg by mouth daily.    [provider]  simvastatin (ZOCOR) 40 MG tablet Take 40 mg by mouth daily.    [provider]  warfarin (COUMADIN) 4 MG tablet Take 2-4 mg by mouth daily. 4 mg on Monday & Friday 2 mg on all other days    [provider]    Family History Family History  Problem Relation Age of Onset  . Lung disease Father   . Leukemia Brother     Social History Social History  Substance Use Topics  . Smoking status: Never Smoker  . Smokeless tobacco: Not on file  . Alcohol use No     Allergies   Aclidinium bromide   Review of Systems Review of Systems  All systems  reviewed and negative, other than as noted in HPI.   Physical Exam Updated Vital Signs BP 132/65   Pulse 83   Resp 19   Ht 6' (1.829 m)   Wt 88.5 kg (195 lb)   SpO2 100%   BMI 26.45 kg/m   Physical Exam  Constitutional: He appears well-developed and well-nourished. No distress.  HENT:  Head: Normocephalic and atraumatic.  Eyes: Conjunctivae are normal. Right eye exhibits no discharge. Left eye exhibits no discharge.  Neck: Neck supple.  Cardiovascular: Normal rate, regular rhythm and normal heart sounds.  Exam reveals no gallop and no friction rub.   No murmur heard. Pulmonary/Chest: No respiratory distress.  Mild tachypnea. coarse breath sounds b/l.   Abdominal: Soft. He exhibits no distension. There is no tenderness.  Musculoskeletal: He exhibits no edema or tenderness.  Neurological: He is alert.  Skin: Skin is warm and dry.  Psychiatric: He has a normal mood and affect. His behavior is normal. Thought content normal.  Nursing note and vitals reviewed.    ED Treatments / Results  Labs (all labs ordered are listed, but only abnormal results are displayed) Labs Reviewed  PROTIME-INR - Abnormal; Notable for the following:       Result Value   Prothrombin Time 21.0 (*)    All other components within normal limits  CBC WITH DIFFERENTIAL/PLATELET - Abnormal; Notable for the following:    Hemoglobin 12.6 (*)    Neutro Abs 8.0 (*)    All other components within normal limits  COMPREHENSIVE METABOLIC PANEL  LIPASE, BLOOD  TYPE AND SCREEN    EKG  EKG Interpretation None       Radiology Dg Chest Portable 1 View  Result Date: 11/08/2016 CLINICAL DATA:  Vomiting blood and bile. History of aspiration pneumonia. EXAM: PORTABLE CHEST 1 VIEW COMPARISON:  June 10, 2015 FINDINGS: There is new infiltrate in the right mid and lower lung. Mild opacity in left base is similar in the interval. No pneumothorax. No other interval changes. IMPRESSION: 1. New infiltrate in the right  mid and lower lung could represent pneumonia or aspiration. Aspiration should be considered given history. 2. Mild opacity in left base is similar in the interval, possibly scar or atelectasis. Electronically Signed   By: Dorise Bullion III M.D   On: 11/08/2016 08:38    Procedures Procedures (including critical care time)  Medications Ordered in ED Medications  sodium chloride 0.9 % bolus 1,000 mL (not administered)  pantoprazole (PROTONIX) 80 mg in sodium chloride 0.9 % 100 mL IVPB (not administered)  sodium chloride 0.9 % bolus 1,000 mL (not administered)  cefTRIAXone (ROCEPHIN) 1 g in dextrose 5 %  50 mL IVPB (not administered)  azithromycin (ZITHROMAX) 500 mg in dextrose 5 % 250 mL IVPB (not administered)  sodium chloride 0.9 % bolus 1,000 mL (1,000 mLs Intravenous New Bag/Given 11/08/16 0805)  ondansetron (ZOFRAN) injection 4 mg (4 mg Intravenous Given 11/08/16 9753)     Initial Impression / Assessment and Plan / ED Course  I have reviewed the triage vital signs and the nursing notes.  Pertinent labs & imaging results that were available during my care of the patient were reviewed by me and considered in my medical decision making (see chart for details).     No completely clear if hemoptysis or hematemesis. Denies hx of significant GIB. Reported by family that seemed to be hemoptysis prior to vomiting and also reportedly febrile to 101. Will cover for possible CAP, particularly with hypotension. Changes on CXR could be from possible aspiration. With his history and the possible hemoptysis, this could be a PE. INR supratherapeutic earlier in week though and still 1.8. I think PE less likely, but will get a VQ scan.  BP improving with VF.  Check rectal temp.    Final Clinical Impressions(s) / ED Diagnoses   Final diagnoses:  Pneumonia  Oliguria and anuria    New Prescriptions New Prescriptions   No medications on file     Virgel Manifold, MD 11/25/16 [id number]

## 2016-11-08 NOTE — ED Notes (Signed)
ED Provider at bedside. 

## 2016-11-08 NOTE — ED Triage Notes (Addendum)
Pt called EMS for vomiting blood and bile.  On arrival pt in bed lying on side vomiting blood/bile. EMS reported rales, very diaphoretic,   Reports pt placed on cpap machine due to o2 of 88 with good wave form. O2 increased to 94 on CPAP. Upon arrival to ed Pt BP is 75/55. Pt taken off CPAP O2 < to 86 on 3L nasal canula. Grunting noted. Pt placed on 12 L non rebreather.

## 2016-11-08 NOTE — Progress Notes (Signed)
SLP Cancellation Note  Patient Details Name: Leonard Shepard MRN: [id number] DOB: 12/28/1922   Cancelled treatment:       Reason Eval/Treat Not Completed: Patient at procedure or test/unavailable; pt remains in ED; concerns for aspiration per CXR indicating new infiltrate in right mid-lower lung on 11/08/16.   Elvina Sidle, M.S., CCC-SLP 11/08/2016, 4:31 PM

## 2016-11-08 NOTE — H&P (Signed)
Date: 11/08/2016               Patient Name:  Leonard Shepard MRN: [id number]  DOB: [birth year] Age / Sex: 83 y.o., male   PCP: System, Pcp Not In         Medical Service: Internal Medicine Teaching Service         Attending Physician: Dr. Beryle Beams, Alyson Locket, MD    First Contact: Dr. Johny Chess Pager: [phone]  Second Contact: Dr. Reesa Chew Pager: [phone]       After Hours (After 5p/  First Contact Pager: [phone]  weekends / holidays): Second Contact Pager: [phone]   Chief Complaint: Fever  History of Present Illness: Leonard Shepard is a 83 yo M with a past medical history of CAD, CKD IV (hx of nephrectomy x 70 yrs), pulmonary fibrosis, PE (on warfarin), adrenal insufficiency who presented to the ED with complaints of fever, hemoptysis/hematemesis. The details of the history were provided by his son who he currently lives with.   This morning the patient woke up and felt cold with chills and was generally weak with increased work of breathing. His son took his temperature with a thermometer which was 101. He began coughing and spitting up bright red blood. He also had foul-smelling vomit, also with blood. The patient was in his usual state of health yesterday, last night noted to feel queasy and did not go out to dinner with family. No other recent complaints of increased cough, nausea vomiting or diarrhea, or other symptoms. Of note, he was admitted at an outside hospital on 3/30 for pneumonia. His son states he also had hemoptysis, fever at that time. His family denies any overt signs of aspiration in the patient.    Prior to arrival, EMS reports desaturation and hypotension. In the ED, T 100.2, HR 79, BP 94/57, RR 26, 94% on 6 L . Labs remarkable for Cr 2.6, WBC 10.4, INR 1.83. LA 2.2, blood cultures drawn. CXR revealed RML and RLL infiltrate. CT Abd/Pelvis without acute changes, large stool burden. V/Q scan negative for PE. He received a total of 4 L IVF for hypotension. He was started on  Ceftriaxone, Azithromycin, received IV PPI. He was admitted for further management.      Meds:  Current Meds  Medication Sig  . amLODipine (NORVASC) 2.5 MG tablet Take 2.5 mg by mouth daily.  . colesevelam (WELCHOL) 625 MG tablet Take 1,875 mg by mouth 2 (two) times daily with a meal.   . diphenhydramine-acetaminophen (TYLENOL PM) 25-500 MG TABS tablet Take 2 tablets by mouth at bedtime.  . fenofibrate 160 MG tablet Take 160 mg by mouth daily.  . finasteride (PROSCAR) 5 MG tablet Take 5 mg by mouth daily.  . fludrocortisone (FLORINEF) 0.1 MG tablet Take 0.1 mg by mouth daily.  . fluticasone (FLONASE) 50 MCG/ACT nasal spray Place 1 spray into both nostrils daily.  . furosemide (LASIX) 20 MG tablet Take 20 mg by mouth daily as needed for fluid.  Marland Kitchen guaifenesin (ROBITUSSIN) 100 MG/5ML syrup Take 200 mg by mouth every 6 (six) hours as needed for cough.  . hydrocortisone (CORTEF) 10 MG tablet Take 1 tablet in the morning and half a tablet at night. (Patient taking differently: Take 5-10 mg by mouth See admin instructions. Take 10 mg by mouth in the morning and take 5 mg by mouth at bedtime)  . hydrocortisone 2.5 % cream Apply 1 application topically 2 (two) times daily.  . isosorbide mononitrate (IMDUR)  60 MG 24 hr tablet Take 60 mg by mouth daily.  Marland Kitchen levothyroxine (SYNTHROID, LEVOTHROID) 25 MCG tablet Take 25 mcg by mouth daily before breakfast.  . Magnesium Cl-Calcium Carbonate (SLOW-MAG PO) Take 1 tablet by mouth daily.  . metoprolol succinate (TOPROL-XL) 25 MG 24 hr tablet Take 25 mg by mouth daily.  . nizatidine (AXID) 300 MG capsule Take 300 mg by mouth at bedtime.  . ondansetron (ZOFRAN) 4 MG tablet Take 4 mg by mouth every 8 (eight) hours as needed for nausea or vomiting.  . pantoprazole (PROTONIX) 40 MG tablet Take 40 mg by mouth daily.  . pravastatin (PRAVACHOL) 20 MG tablet Take 20 mg by mouth daily.  . sertraline (ZOLOFT) 50 MG tablet Take 50 mg by mouth daily.  Marland Kitchen warfarin (COUMADIN) 4  MG tablet Take 2-4 mg by mouth See admin instructions. Take 4 mg by mouth daily on Monday and Friday. Take 2 mg by mouth daily on all other days     Allergies: Allergies as of 11/08/2016 - Review Complete 11/08/2016  Allergen Reaction Noted  . Aclidinium bromide Itching and Other (See Comments) 06/11/2015   Past Medical History:  Diagnosis Date  . Aspiration pneumonia ([place])   . BPH (benign prostatic hyperplasia)   . CAD (coronary artery disease)    s/p stent  . CKD (chronic kidney disease)   . COPD (chronic obstructive pulmonary disease) ([place])   . Depression   . GERD (gastroesophageal reflux disease)   . HLD (hyperlipidemia)   . PE (pulmonary embolism)     Family History:  Family History  Problem Relation Age of Onset  . Lung disease Father   . Leukemia Brother      Social History:  Social History  Substance Use Topics  . Smoking status: Never Smoker  . Smokeless tobacco: Not on file  . Alcohol use No     Review of Systems: A complete ROS was negative except as per HPI.   Physical Exam: Blood pressure 123/63, pulse 90, temperature 100.2 F (37.9 C), temperature source Rectal, resp. rate (!) 31, height 6' (1.829 m), weight 195 lb (88.5 kg), SpO2 96 %. Physical Exam  Constitutional: He is oriented to person, place, and time.  Elderly, ill appearing gentleman resting in bed   HENT:  Head: Normocephalic and atraumatic.  Eyes: Pupils are equal, round, and reactive to light.  Cardiovascular: Normal rate and regular rhythm.   Pulmonary/Chest: No stridor.  Increased work of breathing, diffuse coarse breath sounds, worse on R, R sided crackles   Abdominal: Soft. He exhibits no distension. There is no tenderness.  Musculoskeletal: He exhibits no deformity.  Trace edema to LE  Neurological: He is alert and oriented to person, place, and time.  Skin: Skin is warm and dry.     EKG: personally reviewed my interpretation is sinus rhythm with identifiable P waves in  various leads and regular R-R intervals. Similar to prior.   CXR: personally reviewed my interpretation is opacities to right middle and right lower lung fields.   Assessment & Plan by Problem:  RML and RLL Pneumonia, Hemoptysis Hypoxia Pt presenting with fever, hemoptysis, increased oxygen requirement with imaging evidence of pneumonia to R lobes. Also, presenting with hypotension, increased LA which has responded to IV fluids. Sx are similar to his prior presentation of pneumonia in March in the RUL, treated with cefdinir, doxycycline x 7 days after broad spectrum antibiotics. Though family denies overt signs of aspiration during eating, the location is concerning  for aspiration as an etiology of his pneumonia. He was started on Ceftriaxone and Azithromycin.  --Monitor vital signs --Continue Ceftriaxone and Azithromycin --Sputum gram stain + culture, Urine Strep pneumo ag --Procalcitonin   --Trend Lactic Acid  --F/u Blood cultures  --NPO, SLP Swallow eval --Supplement O2 prn, wean as tolerated --CBC    Emesis, Constipation There was also reportedly vomiting, unclear if this was true hematemesis, or mixing with residual blood in oropharynx with more reliable account of hemoptysis. He received initial dose of IV Pantoprazole 80 mg. His vomiting may be related to post-tussive emesis, or nausea with constipation on imaging- large stool burden on CT Abd. His hemoglobin is stable at 12.6.    --Continue IV PPI 40 mg BID  --Zofran 4 mg q6hr prn   Acute on Chronic Kidney Injury (CKD IV) Pt is s/p remote R nephrectomy (d/t to trauma), baseline Cr appears to be 1.9-2.2. Cr on admission elevated above baseline to 2.6, likely d/t to hypotension.  --BMP, Avoid nephrotoxic agents   Adrenal Insufficiency Pt with reported adrenal insufficiency on home hydrocortisone and fludrocortisone. Unable to find supporting lab data or more detailed history on initial chart review.  --Continue home cortef,  florinef    History of PE Pt has a history of PE, currently on warfarin. V/Q scan in the ED negative for acute PE. Pt recently had supra-therapeutic INR on 9/5 at 4.9, doses held and adjusted. On presentation INR 1.83, will hold anti-coagulation in the setting of hemoptysis. --Hold anti-coagulation    Hypothyroidism History of hypothyroidism, last TSH checked in March wnl.  --Continue home synthroid   Abdominal Aortic Aneurysm Imaged on CT Abd/P, 3.8 cm, slightly increased from prior. Recommended follow up ultrasound in 2 years per ACR guidelines. No further workup as inpatient.    Dispo: Admit patient to Inpatient with expected length of stay greater than 2 midnights.  Signed: Tawny Asal, MD 11/08/2016, 1:20 PM  Pager: [phone]

## 2016-11-08 NOTE — ED Notes (Addendum)
Admitting MD at bedside.

## 2016-11-09 ENCOUNTER — Inpatient Hospital Stay (HOSPITAL_COMMUNITY): Payer: Medicare Other

## 2016-11-09 DIAGNOSIS — N183 Chronic kidney disease, stage 3 (moderate): Secondary | ICD-10-CM

## 2016-11-09 DIAGNOSIS — J181 Lobar pneumonia, unspecified organism: Secondary | ICD-10-CM

## 2016-11-09 DIAGNOSIS — J439 Emphysema, unspecified: Secondary | ICD-10-CM

## 2016-11-09 DIAGNOSIS — J8410 Pulmonary fibrosis, unspecified: Secondary | ICD-10-CM

## 2016-11-09 DIAGNOSIS — Z86711 Personal history of pulmonary embolism: Secondary | ICD-10-CM

## 2016-11-09 DIAGNOSIS — Z7901 Long term (current) use of anticoagulants: Secondary | ICD-10-CM

## 2016-11-09 DIAGNOSIS — R042 Hemoptysis: Secondary | ICD-10-CM | POA: Diagnosis present

## 2016-11-09 DIAGNOSIS — E039 Hypothyroidism, unspecified: Secondary | ICD-10-CM | POA: Insufficient documentation

## 2016-11-09 DIAGNOSIS — Z955 Presence of coronary angioplasty implant and graft: Secondary | ICD-10-CM | POA: Insufficient documentation

## 2016-11-09 DIAGNOSIS — Z905 Acquired absence of kidney: Secondary | ICD-10-CM

## 2016-11-09 DIAGNOSIS — Z8639 Personal history of other endocrine, nutritional and metabolic disease: Secondary | ICD-10-CM

## 2016-11-09 LAB — BASIC METABOLIC PANEL
Anion gap: 9 (ref 5–15)
BUN: 43 mg/dL — ABNORMAL HIGH (ref 6–20)
CO2: 18 mmol/L — ABNORMAL LOW (ref 22–32)
Calcium: 7.8 mg/dL — ABNORMAL LOW (ref 8.9–10.3)
Chloride: 115 mmol/L — ABNORMAL HIGH (ref 101–111)
Creatinine, Ser: 3.13 mg/dL — ABNORMAL HIGH (ref 0.61–1.24)
GFR calc Af Amer: 18 mL/min — ABNORMAL LOW (ref >60)
GFR calc non Af Amer: 16 mL/min — ABNORMAL LOW (ref >60)
Glucose, Bld: 64 mg/dL — ABNORMAL LOW (ref 65–99)
Potassium: 4.4 mmol/L (ref 3.5–5.1)
Sodium: 142 mmol/L (ref 135–145)

## 2016-11-09 LAB — CBC
HCT: 32.3 % — ABNORMAL LOW (ref 39.0–52.0)
Hemoglobin: 10.3 g/dL — ABNORMAL LOW (ref 13.0–17.0)
MCH: 29.3 pg (ref 26.0–34.0)
MCHC: 31.9 g/dL (ref 30.0–36.0)
MCV: 92 fL (ref 78.0–100.0)
Platelets: 180 10*3/uL (ref 150–400)
RBC: 3.51 MIL/uL — ABNORMAL LOW (ref 4.22–5.81)
RDW: 16.3 % — ABNORMAL HIGH (ref 11.5–15.5)
WBC: 13.1 10*3/uL — ABNORMAL HIGH (ref 4.0–10.5)

## 2016-11-09 MED ORDER — ORAL CARE MOUTH RINSE
15.0000 mL | Freq: Two times a day (BID) | OROMUCOSAL | Status: DC
  Administered 2016-11-10 – 2016-11-11 (×2): 15 mL via OROMUCOSAL

## 2016-11-09 MED ORDER — RESOURCE THICKENUP CLEAR PO POWD
ORAL | Status: DC | PRN
  Filled 2016-11-09 (×2): qty 125

## 2016-11-09 MED ORDER — RAMELTEON 8 MG PO TABS
8.0000 mg | ORAL_TABLET | Freq: Every day | ORAL | Status: DC
  Administered 2016-11-09 – 2016-11-11 (×3): 8 mg via ORAL
  Filled 2016-11-09 (×4): qty 1

## 2016-11-09 MED ORDER — SODIUM CHLORIDE 0.9 % IV SOLN
3.0000 g | Freq: Two times a day (BID) | INTRAVENOUS | Status: DC
  Administered 2016-11-09 – 2016-11-10 (×3): 3 g via INTRAVENOUS
  Filled 2016-11-09 (×4): qty 3

## 2016-11-09 MED ORDER — METHYLPREDNISOLONE SODIUM SUCC 125 MG IJ SOLR
80.0000 mg | Freq: Every day | INTRAMUSCULAR | Status: AC
  Administered 2016-11-09 – 2016-11-10 (×2): 80 mg via INTRAVENOUS
  Filled 2016-11-09 (×2): qty 2

## 2016-11-09 NOTE — Evaluation (Signed)
Clinical/Bedside Swallow Evaluation Patient Details  Name: Leonard Shepard MRN: [id number] Date of Birth: Oct 24, 1922  Today's Date: 11/09/2016 Time: SLP Start Time (ACUTE ONLY): 0370 SLP Stop Time (ACUTE ONLY): 0844 SLP Time Calculation (min) (ACUTE ONLY): 18 min  Past Medical History:  Past Medical History:  Diagnosis Date  . Aspiration pneumonia ([place])   . BPH (benign prostatic hyperplasia)   . CAD (coronary artery disease)    s/p stent  . CKD (chronic kidney disease)   . COPD (chronic obstructive pulmonary disease) ([place])   . Depression   . GERD (gastroesophageal reflux disease)   . HLD (hyperlipidemia)   . PE (pulmonary embolism)    Past Surgical History:  Past Surgical History:  Procedure Laterality Date  . APPENDECTOMY    . NEPHRECTOMY Right    HPI:  Leonard Shepard is a 83 yo M with a past medical history of CAD, CKD IV (hx of nephrectomy x 70 yrs), pulmonary fibrosis, PE (on warfarin), adrenal insufficiency who presented to the ED with complaints of fever, hemoptysis/hematemesis. The details of the history were provided by his son who he currently lives with.   This morning the patient woke up and felt cold with chills and was generally weak with increased work of breathing. His son took his temperature with a thermometer which was 101. He began coughing and spitting up bright red blood. He also had foul-smelling vomit, also with blood. The patient was in his usual state of health yesterday, last night noted to feel queasy and did not go out to dinner with family. No other recent complaints of increased cough, nausea vomiting or diarrhea, or other symptoms. Of note, he was admitted at an outside hospital on 3/30 for pneumonia. His son states he also had hemoptysis, fever at that time. His family denies any overt signs of aspiration in the patient.    Assessment / Plan / Recommendation Clinical Impression  Clinical swallowing evaluation was completed using thin liquids, pureed material  and dry solids in setting of PNA.  Most recent chest xray is showing RIGHT mid lung and LLL consolidation concerning for PNA.  Patient reported a that he had a test that sounds like a FEES while in another state that showed some issues a few years ago.  He was unable to elaborate on those issues.  He does report issues with solids not wanting to go down and intermittent coughing with liquids.  Oral mechanism exam was completed and unremarkable.  Given PO's the patient was noted to have a possible pharyngeal dysphagia.  Swallow trigger appeared to be timely and hyo-laryngeal excursion was appreciated to palpation.  However, delayed and immediate cough was noted given thin liquids.  Given current concern for PNA and clinical presentation recommend that the patient remain NPO except meds in pureed material and MBS be completed to determine current swallowing physiology and least restrictive diet.     SLP Visit Diagnosis: Dysphagia, pharyngeal phase (R13.13)    Aspiration Risk    Moderate   Diet Recommendation   NPO except meds in purees pending MBS results.    Medication Administration: Whole meds with puree    Other  Recommendations Oral Care Recommendations: Oral care QID   Follow up Recommendations Other (comment) (TBD)             Prognosis Prognosis for Safe Diet Advancement: Good      Swallow Study   General Date of Onset: 11/08/16 HPI: Leonard Shepard is a 83 yo M with a  past medical history of CAD, CKD IV (hx of nephrectomy x 70 yrs), pulmonary fibrosis, PE (on warfarin), adrenal insufficiency who presented to the ED with complaints of fever, hemoptysis/hematemesis. The details of the history were provided by his son who he currently lives with.   This morning the patient woke up and felt cold with chills and was generally weak with increased work of breathing. His son took his temperature with a thermometer which was 101. He began coughing and spitting up bright red blood. He also had  foul-smelling vomit, also with blood. The patient was in his usual state of health yesterday, last night noted to feel queasy and did not go out to dinner with family. No other recent complaints of increased cough, nausea vomiting or diarrhea, or other symptoms. Of note, he was admitted at an outside hospital on 3/30 for pneumonia. His son states he also had hemoptysis, fever at that time. His family denies any overt signs of aspiration in the patient.  Type of Study: Bedside Swallow Evaluation Previous Swallow Assessment: 06/11/2015 with recommendation for a regular diet with thin liquids.  ST follow up was not recommended.   Diet Prior to this Study: NPO Temperature Spikes Noted: Yes Respiratory Status: Nasal cannula History of Recent Intubation: No Behavior/Cognition: Alert;Cooperative;Pleasant mood Oral Cavity Assessment: Dry Oral Care Completed by SLP: No Oral Cavity - Dentition: Adequate natural dentition Vision: Functional for self-feeding Self-Feeding Abilities: Able to feed self Patient Positioning: Upright in bed Baseline Vocal Quality: Hoarse Volitional Cough: Strong Volitional Swallow: Able to elicit    Oral/Motor/Sensory Function Overall Oral Motor/Sensory Function: Within functional limits   Ice Chips Ice chips: Not tested   Thin Liquid Thin Liquid: Impaired Presentation: Cup;Self Fed;Spoon;Straw Pharyngeal  Phase Impairments: Cough - Delayed;Cough - Immediate    Nectar Thick Nectar Thick Liquid: Not tested   Honey Thick Honey Thick Liquid: Not tested   Puree Puree: Within functional limits Presentation: Spoon   Solid   GO   Solid: Within functional limits Presentation: [place], [place], [place] [facility] SLP [phone] Lamar Sprinkles 11/09/2016,8:54 AM

## 2016-11-09 NOTE — Progress Notes (Signed)
  Speech Language Pathology Treatment: Dysphagia  Patient Details Name: Leonard Shepard MRN: [id number] DOB: 25-Jan-1923 Today's Date: 11/09/2016 Time: 8416-6063 SLP Time Calculation (min) (ACUTE ONLY): 23 min  Assessment / Plan / Recommendation Clinical Impression  ST follow up to review results of MBS with the patient and son, provide education and to ensure that he was able to perform chin tuck while in bed.  The patient's positioning in bed was poor despite attempts to reposition.  Using recommended strategies in a partially reclined position the patient was noted to cough with both thin and nectar thick liquids from a cup using a chin tuck.  The patient was repositioned into his bedside chair and was more upright.  He continued to demonstrate coughing with thin liquids with a chin tuck.  The cough was eliminated with nectar thick liquids from a cup using a chin tuck.  Recommend downgrade diet to Regular with nectar thick liquids using a chin tuck for the liquids.  The patient and son were extensively educated regarding the safe swallow strategies, results of MBS and on the use of aggressive oral care to mitigate the risks of aspiration.  ST will continue to follow during acute stay.    HPI HPI: Leonard Shepard is a 83 yo M with a past medical history of CAD, CKD IV (hx of nephrectomy x 70 yrs), pulmonary fibrosis, PE (on warfarin), adrenal insufficiency who presented to the ED with complaints of fever, hemoptysis/hematemesis. The details of the history were provided by his son who he currently lives with.   This morning the patient woke up and felt cold with chills and was generally weak with increased work of breathing. His son took his temperature with a thermometer which was 101. He began coughing and spitting up bright red blood. He also had foul-smelling vomit, also with blood. The patient was in his usual state of health yesterday, last night noted to feel queasy and did not go out to dinner with  family. No other recent complaints of increased cough, nausea vomiting or diarrhea, or other symptoms. Of note, he was admitted at an outside hospital on 3/30 for pneumonia. His son states he also had hemoptysis, fever at that time. His family denies any overt signs of aspiration in the patient.       SLP Plan  Continue with current plan of care       Recommendations  Diet recommendations: Regular;Nectar-thick liquid Liquids provided via: Cup;No straw Medication Administration: Crushed with puree Supervision: Full supervision/cueing for compensatory strategies Compensations: Slow rate;Small sips/bites;Chin tuck Postural Changes and/or Swallow Maneuvers: Out of bed for meals;Seated upright 90 degrees;Upright 30-60 min after meal;Chin tuck                Oral Care Recommendations: Oral care before and after PO;Staff/trained caregiver to provide oral care Follow up Recommendations: Home health SLP SLP Visit Diagnosis: Dysphagia, oropharyngeal phase (R13.12) Plan: Continue with current plan of care       Leonard Shepard, Leonard Shepard, Leonard Shepard Acute Rehab SLP [phone] Leonard Shepard 11/09/2016, 12:12 PM

## 2016-11-09 NOTE — Progress Notes (Signed)
Paged by nursing that the patient was continuing to complain of difficulty breathing. Went to bedside and patent was sleeping comfortably. He stated he had more difficulty breathing when he was lying flat. He was able to finish sentences without getting dyspneic. Continues to sat high 90s on room air. Portable CXR showed new left lower lung zone consolidation. PE unchanged from prior.   Plan: -Changed the patients abx from ctx and azithro to Unasyn for concern of aspiration pneumonia -stopped fluids, will monitor BP  Arvil Chaco, MD Internal Medicine PGY1 Pager # [phone]

## 2016-11-09 NOTE — Plan of Care (Signed)
Problem: Physical Regulation: Goal: Ability to maintain clinical measurements within normal limits will improve Outcome: Progressing Pt VSS and within normal limits. Pts work of breathing has improved from last night and oxygen saturations are maintained above 92% on 4LNC

## 2016-11-09 NOTE — Progress Notes (Signed)
   Subjective: Pt was evaluated overnight for increased work of breathing, maintained his saturations on 4 L [employer], his fluids were discontinued to avoid volume overload, repeat CXR similar to initial. There was also concern for decreased urine output, bladder scan showed 120 ml. His antibiotics were also broadened overnight.    This morning he reports a difficult night with discomfort with his overall status. Cough has continued without significant change, has not noticed further hemoptysis. He notes several small bowel movements, denies further emesis.   Objective:  Vital signs in last 24 hours: Vitals:   11/08/16 2000 11/08/16 2343 11/09/16 0403 11/09/16 0743  BP: (!) 101/54 (!) 91/48 128/61 (!) 151/68  Pulse: 88 84 80 87  Resp:  (!) 30 (!) 22 (!) 27  Temp:  98.7 F (37.1 C) 98.3 F (36.8 C) 99.7 F (37.6 C)  TempSrc:  Oral Oral Oral  SpO2: 94% 96% 100% 96%  Weight:      Height:       Physical Exam  Constitutional: He is oriented to person, place, and time.  Ill appearing gentleman resting in bed on [place]   Cardiovascular: Normal rate, regular rhythm and normal heart sounds.   Pulmonary/Chest:  Increased work of breathing, continued coarse breath sounds, worse on R. Saturating well on 4 L [place] and able to speak in full sentences    Abdominal: Soft. He exhibits no distension. There is no tenderness.  Neurological: He is alert and oriented to person, place, and time.  Skin: Capillary refill takes 2 to 3 seconds.     Assessment/Plan:  RML and RLL Pneumonia, Hemoptysis Hypoxia Pt presented with fever, hemoptysis, increased oxygen requirement with imaging evidence of pneumonia to R lobes and sepsis with hypotension, increased LA. Sx are similar to his prior presentation of pneumonia in March in the RUL. Procalcitonin elevated, Strep pneumo ag negative, blood cultures negative after 24 hrs. Though family denies overt signs of aspiration during eating, the location is concerning for  aspiration as an etiology of his pneumonia. He was started on Ceftriaxone and Azithromycin which was transitioned to Unasyn for anaerobic coverage.  --Monitor vital signs  --Continue Unasyn per pharmacy --Chest PT, Incentive spirometry   --NPO, SLP recommended MBS for better evaluation --Supplemental O2 prn  --CBC  Acute on Chronic Kidney Injury (CKD IV)  Pt is s/p remote R nephrectomy (d/t to trauma), baseline appears to 1.9-2.2. Cr on admission elevated to 2.6, has increased to 3.13. Concern for oliguria or retention overnight, renal US with no hydronephrosis. His AKI is likely related to hypotension, will continue to support BP and monitor.  --BMP, avoid nephrotoxic agents --Place Foley for I/O monitoring    Adrenal Insufficiency Pt is unable to provide full details of previous workup in [place], however states he was told his body did not have enough steroid and was placed on cortef, florinef ~18 months ago.  --Stress dose steroids- Solumedrol 80 mg x2  --Continue home cortef, florinef  Emesis, Constipation Imaging evidence of constipation, large stool burden, likely contributing to emesis. Less likely to be true hematemesis, can likely discontinue PPI if his emesis continues to improve. He is on a scheduled bowel regimen with small BMs.  --Continue bowel regimen  --Continue PPI for now --Zofran 4 mg q6hr prn        Dispo: Anticipated discharge in approximately 4-5 day(s).   Tawny Asal, MD 11/09/2016, 11:01 AM Pager: [phone]

## 2016-11-09 NOTE — Progress Notes (Signed)
Pharmacy Antibiotic Note  Reinhard Schack is a 83 y.o. male admitted on 11/08/2016 with pneumonia, now w/ concern for possible post-obstructive PNA/aspiration.  Pharmacy has been consulted to change Rocephin and azithro to Unasyn.  Plan: Unasyn 3g IV Q12H.  Height: 6' (182.9 cm) Weight: 195 lb (88.5 kg) IBW/kg (Calculated) : 77.6  Temp (24hrs), Avg:99.9 F (37.7 C), Min:98.3 F (36.8 C), Max:101.4 F (38.6 C)   Recent Labs Lab 11/08/16 0815 11/08/16 1035 11/08/16 1643 11/08/16 1938 11/08/16 2140 11/09/16 0205  WBC 10.4  --   --   --   --  13.1*  CREATININE 2.60*  --   --   --   --  3.13*  LATICACIDVEN  --  2.2* 2.1* 1.9 1.9  --     Estimated Creatinine Clearance: 16.5 mL/min (A) (by C-G formula based on SCr of 3.13 mg/dL (H)).    Allergies  Allergen Reactions  . Aclidinium Bromide Itching and Other (See Comments)    Throat irritation    Antimicrobials this admission: azithro 9/8 >> 9/9 CTX 9/8 >> 9/9 Unasyn 9/9 >>    Thank you for allowing pharmacy to be a part of this patient's care.  Wynona Neat, PharmD, BCPS  11/09/2016 4:26 AM

## 2016-11-10 ENCOUNTER — Inpatient Hospital Stay (HOSPITAL_COMMUNITY): Payer: Medicare Other

## 2016-11-10 ENCOUNTER — Encounter (HOSPITAL_COMMUNITY): Payer: Self-pay | Admitting: *Deleted

## 2016-11-10 DIAGNOSIS — E2740 Unspecified adrenocortical insufficiency: Secondary | ICD-10-CM

## 2016-11-10 DIAGNOSIS — J690 Pneumonitis due to inhalation of food and vomit: Secondary | ICD-10-CM

## 2016-11-10 DIAGNOSIS — N179 Acute kidney failure, unspecified: Secondary | ICD-10-CM

## 2016-11-10 DIAGNOSIS — R1312 Dysphagia, oropharyngeal phase: Secondary | ICD-10-CM | POA: Diagnosis present

## 2016-11-10 DIAGNOSIS — Z7952 Long term (current) use of systemic steroids: Secondary | ICD-10-CM

## 2016-11-10 DIAGNOSIS — N184 Chronic kidney disease, stage 4 (severe): Secondary | ICD-10-CM

## 2016-11-10 DIAGNOSIS — I700 Atherosclerosis of aorta: Secondary | ICD-10-CM

## 2016-11-10 DIAGNOSIS — J918 Pleural effusion in other conditions classified elsewhere: Secondary | ICD-10-CM

## 2016-11-10 HISTORY — DX: Dysphagia, oropharyngeal phase: R13.12

## 2016-11-10 LAB — GLUCOSE, CAPILLARY: Glucose-Capillary: 107 mg/dL — ABNORMAL HIGH (ref 65–99)

## 2016-11-10 LAB — CBC
HCT: 33.5 % — ABNORMAL LOW (ref 39.0–52.0)
Hemoglobin: 10.6 g/dL — ABNORMAL LOW (ref 13.0–17.0)
MCH: 29 pg (ref 26.0–34.0)
MCHC: 31.6 g/dL (ref 30.0–36.0)
MCV: 91.5 fL (ref 78.0–100.0)
PLATELETS: 214 10*3/uL (ref 150–400)
RBC: 3.66 MIL/uL — ABNORMAL LOW (ref 4.22–5.81)
RDW: 16.4 % — AB (ref 11.5–15.5)
WBC: 16.1 10*3/uL — ABNORMAL HIGH (ref 4.0–10.5)

## 2016-11-10 LAB — BASIC METABOLIC PANEL
Anion gap: 6 (ref 5–15)
BUN: 45 mg/dL — AB (ref 6–20)
CALCIUM: 8.7 mg/dL — AB (ref 8.9–10.3)
CHLORIDE: 114 mmol/L — AB (ref 101–111)
CO2: 21 mmol/L — AB (ref 22–32)
CREATININE: 2.87 mg/dL — AB (ref 0.61–1.24)
GFR calc Af Amer: 20 mL/min — ABNORMAL LOW (ref >60)
GFR calc non Af Amer: 18 mL/min — ABNORMAL LOW (ref >60)
Glucose, Bld: 118 mg/dL — ABNORMAL HIGH (ref 65–99)
Potassium: 4.4 mmol/L (ref 3.5–5.1)
SODIUM: 141 mmol/L (ref 135–145)

## 2016-11-10 LAB — PROTIME-INR
INR: 1.66
PROTHROMBIN TIME: 19.5 s — AB (ref 11.4–15.2)

## 2016-11-10 LAB — EXPECTORATED SPUTUM ASSESSMENT W GRAM STAIN, RFLX TO RESP C

## 2016-11-10 LAB — EXPECTORATED SPUTUM ASSESSMENT W REFEX TO RESP CULTURE

## 2016-11-10 MED ORDER — AMLODIPINE BESYLATE 2.5 MG PO TABS
2.5000 mg | ORAL_TABLET | Freq: Every day | ORAL | Status: DC
  Administered 2016-11-10 – 2016-11-12 (×3): 2.5 mg via ORAL
  Filled 2016-11-10 (×3): qty 1

## 2016-11-10 MED ORDER — METOPROLOL SUCCINATE ER 25 MG PO TB24
25.0000 mg | ORAL_TABLET | Freq: Every day | ORAL | Status: DC
  Administered 2016-11-10 – 2016-11-12 (×2): 25 mg via ORAL
  Filled 2016-11-10 (×3): qty 1

## 2016-11-10 MED ORDER — RESOURCE THICKENUP CLEAR PO POWD: ORAL | Status: DC | PRN

## 2016-11-10 MED ORDER — ISOSORBIDE MONONITRATE ER 60 MG PO TB24
60.0000 mg | ORAL_TABLET | Freq: Every day | ORAL | Status: DC
  Administered 2016-11-10 – 2016-11-12 (×3): 60 mg via ORAL
  Filled 2016-11-10 (×3): qty 1

## 2016-11-10 NOTE — Evaluation (Signed)
Physical Therapy Evaluation Patient Details Name: Leonard Shepard MRN: [id number] DOB: [birth year] Today's Date: 11/10/2016   History of Present Illness  Leonard Shepard is a 83 yo with a past medical history of CAD, CKD IV (hx of nephrectomy x 70 yrs), pulmonary fibrosis, PE (on warfarin), adrenal insufficiency who presented to the ED with complaints of fever, hemoptysis/hematemesiis, dx pneumonia.   Clinical Impression  Pt admitted with above diagnosis. Pt currently with functional limitations due to the deficits listed below (see PT Problem List). Pt is currently min guard for bed mobility, minAx1 for sit>stand and modAx2 for ambulation of 3 feet with HHA.  Pt will benefit from skilled PT to increase their independence and safety with mobility to allow discharge to the venue listed below.       Follow Up Recommendations SNF    Equipment Recommendations   (to be determined at next venue)    Recommendations for Other Services       Precautions / Restrictions Precautions Precautions: Fall Restrictions Weight Bearing Restrictions: No      Mobility  Bed Mobility Overal bed mobility: Needs Assistance Bed Mobility: Supine to Sit     Supine to sit: Min guard     General bed mobility comments: min guard for safety  Transfers Overall transfer level: Needs assistance Equipment used: 2 person hand held assist Transfers: Sit to/from Stand Sit to Stand: Min assist         General transfer comment: minA for powerup to RW, vc for anterior pelvic tilt to come all the way into standing  Ambulation/Gait Ambulation/Gait assistance: Mod assist;+2 physical assistance Ambulation Distance (Feet): 3 Feet Assistive device: 2 person hand held assist Gait Pattern/deviations: Decreased step length - right;Decreased step length - left;Shuffle;Step-to pattern;Narrow base of support;Trunk flexed Gait velocity: slowed Gait velocity interpretation: Below normal speed for age/gender General Gait  Details: modA for steadying, vc for anterior pelvic tilt and standing up tall, pt with difficulty keeping CoM in BoS       Balance Overall balance assessment: Needs assistance Sitting-balance support: No upper extremity supported;Feet supported Sitting balance-Leahy Scale: Fair     Standing balance support: Bilateral upper extremity supported Standing balance-Leahy Scale: Poor Standing balance comment: pt requires minA to maintain static standing                             Pertinent Vitals/Pain Pain Assessment: No/denies pain    Home Living Family/patient expects to be discharged to:: Private residence Living Arrangements: Children Available Help at Discharge: Family;Available 24 hours/day Type of Home: House Home Access: Stairs to enter   [employer] of Steps: 6 Home Layout: Two level;Able to live on main level with bedroom/bathroom Home Equipment: Gilford Rile - 2 wheels;Cane - single point      Prior Function Level of Independence: Independent with assistive device(s);Needs assistance   Gait / Transfers Assistance Needed: ambulates household distances with cane   ADL's / Homemaking Assistance Needed: requires assist with lower body dressing due to rotator cuff injury        Hand Dominance   Dominant Hand: Right    Extremity/Trunk Assessment   Upper Extremity Assessment Upper Extremity Assessment: Defer to OT evaluation    Lower Extremity Assessment Lower Extremity Assessment: Generalized weakness    Cervical / Trunk Assessment Cervical / Trunk Assessment: Kyphotic  Communication   Communication: No difficulties  Cognition Arousal/Alertness: Awake/alert Behavior During Therapy: WFL for tasks assessed/performed Overall Cognitive Status: Within  Functional Limits for tasks assessed                                        General Comments General comments (skin integrity, edema, etc.): Pt on 4 L O2 via nasal cannula, SaO2  >93%O2 throughout session.        Assessment/Plan    PT Assessment Patient needs continued PT services  PT Problem List Decreased strength;Decreased range of motion;Decreased balance;Decreased mobility;Cardiopulmonary status limiting activity       PT Treatment Interventions DME instruction;Gait training;Functional mobility training;Therapeutic activities;Therapeutic exercise;Balance training;Patient/family education    PT Goals (Current goals can be found in the Care Plan section)  Acute Rehab PT Goals Patient Stated Goal: go home PT Goal Formulation: With patient Time For Goal Achievement: 11/17/16 Potential to Achieve Goals: Fair    Frequency Min 3X/week        Co-evaluation PT/OT/SLP Co-Evaluation/Treatment: Yes Reason for Co-Treatment: Complexity of the patient's impairments (multi-system involvement) PT goals addressed during session: Mobility/safety with mobility;Balance         AM-PAC PT "6 Clicks" Daily Activity  Outcome Measure Difficulty turning over in bed (including adjusting bedclothes, sheets and blankets)?: A Little Difficulty moving from lying on back to sitting on the side of the bed? : A Little Difficulty sitting down on and standing up from a chair with arms (e.g., wheelchair, bedside commode, etc,.)?: Unable Help needed moving to and from a bed to chair (including a wheelchair)?: A Lot Help needed walking in hospital room?: A Lot Help needed climbing 3-5 steps with a railing? : Total 6 Click Score: 12    End of Session Equipment Utilized During Treatment: Gait belt;Oxygen Activity Tolerance: Patient tolerated treatment well Patient left: in chair;with call bell/phone within reach;with chair alarm set Nurse Communication: Mobility status PT Visit Diagnosis: Unsteadiness on feet (R26.81);Other abnormalities of gait and mobility (R26.89);Muscle weakness (generalized) (M62.81);Difficulty in walking, not elsewhere classified (R26.2)    Time:  0272-5366 PT Time Calculation (min) (ACUTE ONLY): 33 min   Charges:   PT Evaluation $PT Eval Moderate Complexity: 1 Mod     PT G Codes:        Leonard Shepard B. Leonard Shepard PT, DPT Acute Rehabilitation  [phone] Pager [phone]    Leonard Shepard 11/10/2016, 1:57 PM

## 2016-11-10 NOTE — Progress Notes (Signed)
Received transfer patient from [facility]. A & O settled into the room and call bell and phone within reach. Skin intact.

## 2016-11-10 NOTE — Evaluation (Signed)
Occupational Therapy Evaluation Patient Details Name: Leonard Shepard MRN: [id number] DOB: 1922/08/19 Today's Date: 11/10/2016    History of Present Illness Leonard Shepard is a 83 yo with a past medical history of CAD, CKD IV (hx of nephrectomy x 70 yrs), pulmonary fibrosis, PE (on warfarin), adrenal insufficiency who presented to the ED with complaints of fever, hemoptysis/hematemesiis, dx pneumonia.    Clinical Impression   PTA, pt was living with his son and daughter-in-law. He was ambulating with a cane and no assistance for household distances and utilizing RW for community mobility and required assistance only for LB dressing due to B shoulder pain per pt report. Pt currently requires mod assist +2 for toilet transfers taking 5-6 pivotal steps, max assist for LB ADL, and min assist for UB ADL. Pt presents with decreased activity tolerance for ADL and generalized weaknessimpacting his ability to participate in ADL safely at [facility]. He would benefit from continued OT services while admitted to improve independence and safety with ADL and functional mobility. Recommend SNF level rehabilitation post-acute D/C to maximize return to PLOF. Will continue to follow while admitted.      Follow Up Recommendations  SNF;Supervision/Assistance - 24 hour    Equipment Recommendations  Other (comment) (TBD at next venue of care)    Recommendations for Other Services       Precautions / Restrictions Precautions Precautions: Fall Restrictions Weight Bearing Restrictions: No      Mobility Bed Mobility Overal bed mobility: Needs Assistance Bed Mobility: Supine to Sit     Supine to sit: Min guard     General bed mobility comments: min guard for safety  Transfers Overall transfer level: Needs assistance Equipment used: 2 person hand held assist Transfers: Sit to/from Stand Sit to Stand: Min assist         General transfer comment: Min assist to power up. Once standing requiring mod assist +2   without RW (unavailable on unit at this time) for stability during mobility.     Balance Overall balance assessment: Needs assistance Sitting-balance support: No upper extremity supported;Feet supported Sitting balance-Leahy Scale: Fair     Standing balance support: Bilateral upper extremity supported Standing balance-Leahy Scale: Poor Standing balance comment: Reliant on external assistance.                            ADL either performed or assessed with clinical judgement   ADL Overall ADL's : Needs assistance/impaired Eating/Feeding: Supervision/ safety;Set up;Sitting Eating/Feeding Details (indicate cue type and reason): Supervision to adhere to safe swallow precautions.  Grooming: Supervision/safety;Set up;Sitting   Upper Body Bathing: Minimal assistance;Sitting   Lower Body Bathing: Maximal assistance;Sit to/from stand   Upper Body Dressing : Minimal assistance;Sitting   Lower Body Dressing: Maximal assistance;Sit to/from stand   Toilet Transfer: Moderate assistance;+2 for physical assistance;Ambulation Toilet Transfer Details (indicate cue type and reason): 2 person handheld assistance. Taking approximately 6 steps.  Toileting- Clothing Manipulation and Hygiene: +2 for physical assistance;Sit to/from stand;Moderate assistance       Functional mobility during ADLs: Moderate assistance;+2 for physical assistance General ADL Comments: Pt with significantly diminished activity tolerance for ADL impacting safety and independence with tasks. Very willing to participate and fatigues easily.      Vision Patient Visual Report: No change from baseline Vision Assessment?: No apparent visual deficits     Perception     Praxis      Pertinent Vitals/Pain Pain Assessment: Faces Faces Pain Scale:  Hurts little more Pain Location: B shoulders with AROM Pain Descriptors / Indicators: Grimacing Pain Intervention(s): Monitored during session     Hand Dominance  Right   Extremity/Trunk Assessment Upper Extremity Assessment Upper Extremity Assessment: RUE deficits/detail;LUE deficits/detail RUE Deficits / Details: Rotator cuff pathology at baseline per pt report. Diminished shoulder forward flexion AROM available with pt able to complete only ~0-5 degrees.  LUE Deficits / Details: History of shoulder operation. ~0-5 degrees shoulder forward flexion AROM available.    Lower Extremity Assessment Lower Extremity Assessment: Generalized weakness   Cervical / Trunk Assessment Cervical / Trunk Assessment: Kyphotic   Communication Communication Communication: No difficulties   Cognition Arousal/Alertness: Awake/alert Behavior During Therapy: WFL for tasks assessed/performed Overall Cognitive Status: Within Functional Limits for tasks assessed                                     General Comments  Pt on 4L O2 via [place] throughout session with SpO2 ranging from 88-96% throughout session.     Exercises     Shoulder Instructions      Home Living Family/patient expects to be discharged to:: Private residence Living Arrangements: Children Available Help at Discharge: Family;Available 24 hours/day Type of Home: House Home Access: Stairs to enter [employer] of Steps: 6   Home Layout: Two level;Able to live on main level with bedroom/bathroom     Bathroom Shower/Tub: Tub/shower unit     Bathroom Accessibility: Yes   Home Equipment: Walker - 2 wheels;Cane - single point          Prior Functioning/Environment Level of Independence: Independent with assistive device(s);Needs assistance  Gait / Transfers Assistance Needed: ambulates household distances with cane  ADL's / Homemaking Assistance Needed: requires assist with lower body dressing due to rotator cuff injury Communication / Swallowing Assistance Needed: currently on nectar thick liquids          OT Problem List: Decreased strength;Decreased range of  motion;Decreased activity tolerance;Impaired balance (sitting and/or standing);Decreased safety awareness;Decreased knowledge of use of DME or AE;Decreased knowledge of precautions;Pain      OT Treatment/Interventions: Self-care/ADL training;Therapeutic exercise;Neuromuscular education;Patient/family education;Visual/perceptual remediation/compensation;Therapeutic activities    OT Goals(Current goals can be found in the care plan section) Acute Rehab OT Goals Patient Stated Goal: go home OT Goal Formulation: With patient Time For Goal Achievement: 11/24/16 Potential to Achieve Goals: Good  OT Frequency: Min 2X/week   Barriers to D/C:            Co-evaluation PT/OT/SLP Co-Evaluation/Treatment: Yes Reason for Co-Treatment: Complexity of the patient's impairments (multi-system involvement) PT goals addressed during session: Mobility/safety with mobility;Balance OT goals addressed during session: ADL's and self-care      AM-PAC PT "6 Clicks" Daily Activity     Outcome Measure Help from another person eating meals?: A Little Help from another person taking care of personal grooming?: A Little Help from another person toileting, which includes using toliet, bedpan, or urinal?: A Lot Help from another person bathing (including washing, rinsing, drying)?: A Lot Help from another person to put on and taking off regular upper body clothing?: A Little Help from another person to put on and taking off regular lower body clothing?: A Lot 6 Click Score: 15   End of Session Equipment Utilized During Treatment: Gait belt;Oxygen (4L O2) Nurse Communication: Mobility status  Activity Tolerance: Patient tolerated treatment well Patient left: in chair;with call bell/phone within reach;with  chair alarm set  OT Visit Diagnosis: Muscle weakness (generalized) (M62.81);Unsteadiness on feet (R26.81);Pain Pain - Right/Left:  (bilateral) Pain - part of body: Shoulder                Time:  9753-0051 OT Time Calculation (min): 37 min Charges:  OT General Charges $OT Visit: 1 Visit OT Evaluation $OT Eval Moderate Complexity: 1 Mod G-Codes:     Norman Herrlich, MS OTR/L  Pager: [place] A Caron Tard[place] 11/10/2016, 3:52 PM

## 2016-11-10 NOTE — Progress Notes (Signed)
Internal Medicine Attending:   I saw and examined the patient. I reviewed the resident's note and I agree with the resident's findings and plan as documented in the resident's note. Patient feeling much better today, SOB improved, cough improved, no further hematemesis.  Main complaint is patient does not enjoy liquid thickener.  Son at beside, also feels father doing better. On exam: No distress, 98 o2 sat on 2L via [place], right base crackles otherwise clear, heart RRR, abd soft nontender, trace bialteral edema.  A/P   Aspiration pneumonia ([place]) - Agree with Unasyn, if continues to do well with change to Augmentin and plan for SNF discharge in 1-2 days, may transfer to [facility] and d/c tele.   Oropharyngeal dysphagia -appreciate SLP evaluation.    Abdominal aortic atherosclerosis ([place]) - Noted on CT

## 2016-11-10 NOTE — Progress Notes (Signed)
   Subjective:  Patient was feeling much better than seen this morning. He remained afebrile. His work of breathing has been improved. Oxygen requirement is decreasing. He did not had any more hemoptysis. He did not like the liquid thickener which was given to him because of oropharyngeal dysphagia and a concern for aspiration.  Objective:  Vital signs in last 24 hours: Vitals:   11/10/16 0859 11/10/16 1300 11/10/16 1428 11/10/16 1553  BP: (!) 164/87  (!) 168/82 (!) 170/76  Pulse: 69   65  Resp:   17 18  Temp: 97.6 F (36.4 C) 97.6 F (36.4 C)  97.6 F (36.4 C)  TempSrc: Oral Oral  Oral  SpO2:    98%  Weight:    217 lb 11.2 oz (98.7 kg)  Height:    6' (1.829 m)   Gen.well-developed, well-nourished elderly man, in no acute distress. Chest. Few rhonchi at right base. CV. Regular rate and rhythm. Abdomen.soft, non tender, bowel sounds positive. Extremities.trace pedal edema bilaterally.  Assessment/Plan:  RML and RLL aspiration Pneumonia. Patient seems improving. He was saturating in the high 90s on 2 L via nasal cannula. It seems like he might be having chronic aspiration causing recurrent right sided pneumonia. Repeat chest x-ray shows prominent interstitial markings with a small left-sided pleural effusion along with right middle and lower lobe opacities. Patient remained afebrile,there is an increase in his leukocytosis most likely because of stress dose of steroid. -transfer to MedSurg. -Continue Unasyn- will be discharged on Augmentin. -Continue chest PT and incentive spirometry.  Acute on Chronic Kidney Injury (CKD IV). There was mild improvement in his kidney functions. Creatinine was 2.87 today which increased his GFR from 16-18 today. -keep monitoring renal function.   Oropharyngeal dysphagia.most likely the cause of his recurrent pneumonia. We appreciate SLP recommendations.  Adrenal Insufficiency. Unable to obtain any documentation that patient was on Cortef and  Florinef for about 18 months. He already got 2 doses of Solu-Medrol 80 mg each. -Continue home dose of Cortef and Florinef.   Dispo: Anticipated discharge in approximately 1-2 day(s).   Lorella Nimrod, MD 11/10/2016, 4:50 PM Pager: [phone]

## 2016-11-10 NOTE — Progress Notes (Signed)
  Speech Language Pathology Treatment: Dysphagia  Patient Details Name: Quin Mathenia MRN: [id number] DOB: 1922-05-25 Today's Date: 11/10/2016 Time: 9924-2683 SLP Time Calculation (min) (ACUTE ONLY): 24 min  Assessment / Plan / Recommendation Clinical Impression  Pt recalled chin tuck strategy but required moderate multimodal assist to perform fading to mild-mod. Pt typically drinks a lot of water due to having one kidney. Educated pt/son/RN on water protocol where pt may have sips thin water only in between meals, not with meds and following oral care. Immediate cough following water and chin tuck as suspected; delayed throat clear intermittently with nectar and tuck. Appetite small and suspect will improve. No oral deficits. Will continue to follow.    HPI HPI: Mr. Sharps is a 83 yo M with a past medical history of CAD, CKD IV (hx of nephrectomy x 70 yrs), pulmonary fibrosis, PE (on warfarin), adrenal insufficiency who presented to the ED with complaints of fever, hemoptysis/hematemesis. The details of the history were provided by his son who he currently lives with.   This morning the patient woke up and felt cold with chills and was generally weak with increased work of breathing. His son took his temperature with a thermometer which was 101. He began coughing and spitting up bright red blood. He also had foul-smelling vomit, also with blood. The patient was in his usual state of health yesterday, last night noted to feel queasy and did not go out to dinner with family. No other recent complaints of increased cough, nausea vomiting or diarrhea, or other symptoms. Of note, he was admitted at an outside hospital on 3/30 for pneumonia. His son states he also had hemoptysis, fever at that time. His family denies any overt signs of aspiration in the patient.       SLP Plan  Continue with current plan of care       Recommendations  Diet recommendations: Regular;Nectar-thick liquid Liquids  provided via: Cup;No straw Medication Administration: Whole meds with puree Supervision: Full supervision/cueing for compensatory strategies Compensations: Slow rate;Small sips/bites;Chin tuck Postural Changes and/or Swallow Maneuvers: Seated upright 90 degrees;Upright 30-60 min after meal;Chin tuck                Oral Care Recommendations: Oral care before and after PO;Oral care BID Follow up Recommendations: Home health SLP SLP Visit Diagnosis: Dysphagia, oropharyngeal phase (R13.12) Plan: Continue with current plan of care       GO                Houston Siren 11/10/2016, 8:47 AM   Orbie Pyo Colvin Caroli.Ed [employer] [phone]

## 2016-11-11 ENCOUNTER — Encounter (HOSPITAL_COMMUNITY): Payer: Self-pay | Admitting: *Deleted

## 2016-11-11 DIAGNOSIS — K5900 Constipation, unspecified: Secondary | ICD-10-CM

## 2016-11-11 LAB — BASIC METABOLIC PANEL
ANION GAP: 7 (ref 5–15)
BUN: 52 mg/dL — ABNORMAL HIGH (ref 6–20)
CALCIUM: 8.9 mg/dL (ref 8.9–10.3)
CO2: 23 mmol/L (ref 22–32)
CREATININE: 2.53 mg/dL — AB (ref 0.61–1.24)
Chloride: 110 mmol/L (ref 101–111)
GFR, EST AFRICAN AMERICAN: 24 mL/min — AB (ref >60)
GFR, EST NON AFRICAN AMERICAN: 21 mL/min — AB (ref >60)
GLUCOSE: 120 mg/dL — AB (ref 65–99)
Potassium: 4.1 mmol/L (ref 3.5–5.1)
Sodium: 140 mmol/L (ref 135–145)

## 2016-11-11 LAB — CBC
HEMATOCRIT: 33.7 % — AB (ref 39.0–52.0)
Hemoglobin: 10.7 g/dL — ABNORMAL LOW (ref 13.0–17.0)
MCH: 28.9 pg (ref 26.0–34.0)
MCHC: 31.8 g/dL (ref 30.0–36.0)
MCV: 91.1 fL (ref 78.0–100.0)
PLATELETS: 230 10*3/uL (ref 150–400)
RBC: 3.7 MIL/uL — ABNORMAL LOW (ref 4.22–5.81)
RDW: 16.1 % — AB (ref 11.5–15.5)
WBC: 11.3 10*3/uL — AB (ref 4.0–10.5)

## 2016-11-11 LAB — PROTIME-INR
INR: 1.23
Prothrombin Time: 15.4 seconds — ABNORMAL HIGH (ref 11.4–15.2)

## 2016-11-11 MED ORDER — AMOXICILLIN-POT CLAVULANATE 500-125 MG PO TABS
1.0000 | ORAL_TABLET | Freq: Two times a day (BID) | ORAL | Status: DC
  Administered 2016-11-11 – 2016-11-12 (×2): 500 mg via ORAL
  Filled 2016-11-11 (×3): qty 1

## 2016-11-11 MED ORDER — WARFARIN SODIUM 4 MG PO TABS
4.0000 mg | ORAL_TABLET | Freq: Once | ORAL | Status: AC
  Filled 2016-11-11: qty 1

## 2016-11-11 MED ORDER — WARFARIN - PHARMACIST DOSING INPATIENT: Freq: Every day | Status: DC

## 2016-11-11 MED ORDER — AMPICILLIN-SULBACTAM SODIUM 3 (2-1) G IJ SOLR
3.0000 g | Freq: Once | INTRAMUSCULAR | Status: AC
  Administered 2016-11-11: 3 g via INTRAVENOUS
  Filled 2016-11-11: qty 3

## 2016-11-11 NOTE — Clinical Social Work Note (Signed)
Clinical Social Work Assessment  Patient Details  Name: Leonard Shepard MRN: [id number] Date of Birth: [birth year]  Date of referral:  11/11/16               Reason for consult:  Facility Placement                Permission sought to share information with:  Facility Sport and exercise psychologist, Family Supports Permission granted to share information::  Yes, Verbal Permission Granted  Name::     Venia Minks::  SNFs  Relationship::  Son  Contact Information:  [phone]  Housing/Transportation Living arrangements for the past 2 months:  [place] of Information:  Patient, Adult Children Patient Interpreter Needed:  None Criminal Activity/Legal Involvement Pertinent to Current Situation/Hospitalization:  No - Comment as needed Significant Relationships:  Adult Children Lives with:  Adult Children Do you feel safe going back to the place where you live?  No Need for family participation in patient care:  Yes (Comment)  Care giving concerns:  CSW received consult for possible SNF placement at time of discharge. CSW spoke with patient regarding PT recommendation of SNF placement at time of discharge. Patient reported that patient's son is currently unable to care for patient at their home given patient's current physical needs and fall risk. Patient expressed understanding of PT recommendation and is agreeable to SNF placement at time of discharge. He asked CSW to speak with his son regarding the facility. CSW to continue to follow and assist with discharge planning needs.   Social Worker assessment / plan:  CSW spoke with patient and his son concerning possibility of rehab at [facility] before returning home.  Employment status:  Retired Forensic scientist:  Medicare PT Recommendations:  [place] / Referral to community resources:  [place]  Patient/Family's Response to care:  Patient recognizes need for rehab before returning home  and is agreeable to a SNF in [place]. CSW provided SNF list to son.   Patient/Family's Understanding of and Emotional Response to Diagnosis, Current Treatment, and Prognosis:  Patient/family is realistic regarding therapy needs and expressed being hopeful for SNF placement. Patient expressed understanding of CSW role and discharge process as well as medical condition. Patient's son is appreciative of CSW help since he has never had experience with SNFs before. Patient lives with his son and their house is set up for him to be able to move around with ease but son would like patient to be able to get out of the bed on his own. No questions/concerns about plan or treatment.    Emotional Assessment Appearance:  Appears stated age Attitude/Demeanor/Rapport:  Other (Appropriate) Affect (typically observed):  Accepting, Appropriate Orientation:  Oriented to Self, Oriented to Place, Oriented to Situation, Oriented to  Time Alcohol / Substance use:  Not Applicable Psych involvement (Current and /or in the community):  No (Comment)  Discharge Needs  Concerns to be addressed:  Care Coordination Readmission within the last 30 days:  No Current discharge risk:  None Barriers to Discharge:  Continued Medical Work up   [employer], [place] 11/11/2016, 11:49 AM

## 2016-11-11 NOTE — Progress Notes (Signed)
Internal Medicine Attending:   I saw and examined the patient. I reviewed the resident's note and I agree with the resident's findings and plan as documented in the resident's note. Patient continues to have some generalized weakness as well as productive cough but improving.   On exam he remains on 4L of supplemental o2 via [place], pulm: right mid and base crackles, left base crackles aslo present, heart is RRR, trace lower extremity edema.  A/P Aspiration PNA - Improving will transition from Unasyn to Augmentin would plan for total course of 10 days. -Continue chest PT, add mucinex -Wean O2 as tolerated  Hx of VTE - Hemoptysis has resolved, Ok to resume warfarin, dosing per pharmacy  Dispo: d/c to SNF in 1 to 2 days, would like to wean oxygen more prior to discharge given he was not previously on oxygen.

## 2016-11-11 NOTE — Progress Notes (Signed)
   Subjective: No acute events overnight, transferred to floor from step down unit. He reports overall improvement but feels generally weak, still having productive cough with improved hemoptysis.    Objective:  Vital signs in last 24 hours: Vitals:   11/10/16 1428 11/10/16 1553 11/10/16 2144 11/10/16 2145  BP: (!) 168/82 (!) 170/76 135/73   Pulse:  65 72 71  Resp: 17 18 18 18   Temp:  97.6 F (36.4 C) 97.7 F (36.5 C)   TempSrc:  Oral Oral   SpO2:  98% 96% 96%  Weight:  217 lb 11.2 oz (98.7 kg)    Height:  6' (1.829 m)     Physical Exam  Constitutional: He is oriented to person, place, and time.  Elderly gentleman resting in bed comfortably on [place], no acute distress   HENT:  Head: Normocephalic and atraumatic.  Cardiovascular: Normal rate, regular rhythm and normal heart sounds.   Pulmonary/Chest:  R sided crackles, interval improvement in work of breathing, sating well on 4 L [place]   Abdominal: Soft. He exhibits no distension. There is no tenderness.  Neurological: He is alert and oriented to person, place, and time.    Assessment/Plan:  RML and RLL Pneumonia, Hypoxia, Oropharyngeal dysphagia Pt presented with fever, hemoptysis, increased oxygen requirement with imaging evidence of pneumonia to R lobes and sepsis with hypotension, increased LA. He has been treated with Unasyn and has clinically improved, afebrile. His pna location is concerning for aspiration, subsequent evaluation revealed oropharyngeal dysphagia, appreciate SLP recommendations. PT/OT recommend SNF placement   --Monitor vital signs  --Transition to PO Augmentin  --Chest PT, Incentive spirometry   --Diet per SLP  --Supplemental O2 prn, wean as tolerated   --CBC --SW  Acute on Chronic Kidney Injury (CKD IV)  Pt is s/p remote R nephrectomy (d/t to trauma), baseline appears to 1.9-2.2. Cr peaked at 3.13, AKI likely related to hypotension in the setting of sepsis as above. His Cr is now down-trending, currently  2.53.   --BMP, avoid nephrotoxic agents  Adrenal Insufficiency Pt is unable to provide full details of previous workup in [place], however states he was told his body did not have enough steroid and was placed on cortef, florinef ~18 months ago. He is s/p stress dose steroids x2 doses.  --Continue home cortef, florinef  Constipation Imaging evidence of constipation, large stool burden, likely contributed to emesis which has now resolved. Less likely to be true hematemesis, will DC PPI. He is on a scheduled bowel regimen, BM recorded yesterday.  --Continue bowel regimen  --Discontinue PPI --Zofran 4 mg q6hr prn        Dispo: Anticipated discharge in approximately 1-2 day(s).   Tawny Asal, MD 11/11/2016, 6:49 AM Pager: [phone]

## 2016-11-11 NOTE — Progress Notes (Signed)
ANTICOAGULATION CONSULT NOTE - Initial Consult  Pharmacy Consult for warfarin restart Indication: Hx of pulmonary embolus  Allergies  Allergen Reactions  . Aclidinium Bromide Itching and Other (See Comments)    Throat irritation    Patient Measurements: Height: 6' (182.9 cm) Weight: 217 lb 11.2 oz (98.7 kg) IBW/kg (Calculated) : 77.6  Vital Signs: BP: 145/74 (09/11 1110) Pulse Rate: 55 (09/11 1110)  Labs:  Recent Labs  11/09/16 0205 11/10/16 0305 11/11/16 0724  HGB 10.3* 10.6* 10.7*  HCT 32.3* 33.5* 33.7*  PLT 180 214 230  LABPROT  --  19.5* 15.4*  INR  --  1.66 1.23  CREATININE 3.13* 2.87* 2.53*    Estimated Creatinine Clearance: 22.7 mL/min (A) (by C-G formula based on SCr of 2.53 mg/dL (H)).   Medical History: Past Medical History:  Diagnosis Date  . Aspiration pneumonia ([place])   . BPH (benign prostatic hyperplasia)   . CAD (coronary artery disease)    s/p stent  . CKD (chronic kidney disease)   . COPD (chronic obstructive pulmonary disease) ([place])   . Depression   . GERD (gastroesophageal reflux disease)   . HLD (hyperlipidemia)   . PE (pulmonary embolism)     Medications:  Prescriptions Prior to Admission  Medication Sig Dispense Refill Last Dose  . amLODipine (NORVASC) 2.5 MG tablet Take 2.5 mg by mouth daily.   11/07/2016 at Unknown time  . colesevelam (WELCHOL) 625 MG tablet Take 1,875 mg by mouth 2 (two) times daily with a meal.    11/07/2016 at Unknown time  . diphenhydramine-acetaminophen (TYLENOL PM) 25-500 MG TABS tablet Take 2 tablets by mouth at bedtime.   11/07/2016 at Unknown time  . fenofibrate 160 MG tablet Take 160 mg by mouth daily.   11/07/2016 at Unknown time  . finasteride (PROSCAR) 5 MG tablet Take 5 mg by mouth daily.   11/07/2016 at Unknown time  . fludrocortisone (FLORINEF) 0.1 MG tablet Take 0.1 mg by mouth daily.   11/07/2016 at Unknown time  . fluticasone (FLONASE) 50 MCG/ACT nasal spray Place 1 spray into both nostrils daily.   11/07/2016  at Unknown time  . furosemide (LASIX) 20 MG tablet Take 20 mg by mouth daily as needed for fluid.   11/07/2016 at Unknown time  . guaifenesin (ROBITUSSIN) 100 MG/5ML syrup Take 200 mg by mouth every 6 (six) hours as needed for cough.   PRN  . hydrocortisone (CORTEF) 10 MG tablet Take 1 tablet in the morning and half a tablet at night. (Patient taking differently: Take 5-10 mg by mouth See admin instructions. Take 10 mg by mouth in the morning and take 5 mg by mouth at bedtime) 45 tablet 2 11/07/2016 at Unknown time  . hydrocortisone 2.5 % cream Apply 1 application topically 2 (two) times daily.   11/07/2016 at Unknown time  . isosorbide mononitrate (IMDUR) 60 MG 24 hr tablet Take 60 mg by mouth daily.   11/07/2016 at Unknown time  . levothyroxine (SYNTHROID, LEVOTHROID) 25 MCG tablet Take 25 mcg by mouth daily before breakfast.   11/07/2016 at Unknown time  . Magnesium Cl-Calcium Carbonate (SLOW-MAG PO) Take 1 tablet by mouth daily.   11/07/2016 at Unknown time  . metoprolol succinate (TOPROL-XL) 25 MG 24 hr tablet Take 25 mg by mouth daily.   11/07/2016 at ?  . nizatidine (AXID) 300 MG capsule Take 300 mg by mouth at bedtime.   11/07/2016 at Unknown time  . ondansetron (ZOFRAN) 4 MG tablet Take 4 mg by  mouth every 8 (eight) hours as needed for nausea or vomiting.   PRN  . pantoprazole (PROTONIX) 40 MG tablet Take 40 mg by mouth daily.   11/07/2016 at Unknown time  . pravastatin (PRAVACHOL) 20 MG tablet Take 20 mg by mouth daily.   11/07/2016 at Unknown time  . sertraline (ZOLOFT) 50 MG tablet Take 50 mg by mouth daily.   11/07/2016 at Unknown time  . warfarin (COUMADIN) 4 MG tablet Take 2-4 mg by mouth See admin instructions. Take 4 mg by mouth daily on Monday and Friday. Take 2 mg by mouth daily on all other days   Past Week at Unknown time    Assessment: 89 yoM on warfarin prior to admission for history of PE. Was being held for hemoptysis and has not had a dose since at least 9/8 but maybe missed doses prior to  admission. Pharmacy has been consulted to restart warfarin. INR on admit was 1.83, today it is down to 1.23. CBC stable, on SCDs  PTA dose: Warfarin 4 mg PO daily on Monday and Friday and 2 mg on all other days  Goal of Therapy:  INR 2-3 Monitor platelets by anticoagulation protocol: Yes   Plan:  Give warfarin 4 mg po now Monitor daily INR, CBC, clinical course, s/sx of bleed, PO intake, DDI   Thank you for allowing [place] to participate in this patients care.  Jens Som, PharmD Clinical phone for 11/11/2016 from 7a-3:30p: [phone] If after 3:30p, please call main pharmacy at: [phone] 11/11/2016 1:52 PM

## 2016-11-11 NOTE — NC FL2 (Signed)
[place] LEVEL OF CARE SCREENING TOOL     IDENTIFICATION  Patient Name: Leonard Shepard Birthdate: 1922/12/25 Sex: male Admission Date (Current Location): 11/08/2016  [facility] and [place] Number:  Herbalist and Address:  [facility] [place]. [facility], [place] [address], [place], [place] [address]      Provider Number: [id number]  Attending Physician Name and Address:  Lucious Groves, DO  Relative Name and Phone Number:  [place], son ,[phone]    Current Level of Care: Hospital Recommended Level of Care: [place] Prior Approval Number:    Date Approved/Denied:   PASRR Number: [id number]  Discharge Plan: SNF    Current Diagnoses: Patient Active Problem List   Diagnosis Date Noted  . Abdominal aortic atherosclerosis ([place]) 11/10/2016  . Oropharyngeal dysphagia 11/10/2016  . Hemoptysis   . Chronic anticoagulation   . S/P coronary artery stent placement   . Acquired hypothyroidism   . History of adrenal insufficiency   . Postinflammatory pulmonary fibrosis ([place])   . H/O unilateral nephrectomy   . Hx pulmonary embolism   . Aspiration pneumonia ([place]) 11/08/2016  . Orthostasis 06/11/2015  . Sepsis ([place]) 06/11/2015  . Nausea & vomiting 06/11/2015  . Abdominal pain 06/11/2015  . HLD (hyperlipidemia)   . GERD (gastroesophageal reflux disease)   . CAD (coronary artery disease)   . PE (pulmonary embolism)   . BPH (benign prostatic hyperplasia)   . Chronic renal insufficiency, stage III (moderate)   . COPD (chronic obstructive pulmonary disease) ([place])   . Depression     Orientation RESPIRATION BLADDER Height & Weight     Self, Time, Situation, Place  O2 (Nasal cannula 4L) Continent Weight: 98.7 kg (217 lb 11.2 oz) Height:  6' (182.9 cm)  BEHAVIORAL SYMPTOMS/MOOD NEUROLOGICAL BOWEL NUTRITION STATUS      Continent Diet (Please see DC Summary)  AMBULATORY STATUS COMMUNICATION OF NEEDS Skin   Extensive Assist Verbally Normal                       Personal Care Assistance Level of Assistance  Bathing, Feeding, Dressing Bathing Assistance: Maximum assistance Feeding assistance: Limited assistance Dressing Assistance: Limited assistance     Functional Limitations Info  Sight, Hearing Sight Info: Impaired Hearing Info: Impaired      SPECIAL CARE FACTORS FREQUENCY  PT (By licensed PT)     PT Frequency: 5x/week              Contractures      Additional Factors Info  Code Status, Allergies, Psychotropic Code Status Info: Full Allergies Info: Aclidinium Bromide Psychotropic Info: Zoloft         Current Medications (11/11/2016):  This is the current hospital active medication list Current Facility-Administered Medications  Medication Dose Route Frequency Provider Last Rate Last Dose  . acetaminophen (TYLENOL) tablet 650 mg  650 mg Oral Q6H PRN Lorella Nimrod, MD   650 mg at 11/08/16 2232   Or  . acetaminophen (TYLENOL) suppository 650 mg  650 mg Rectal Q6H PRN Lorella Nimrod, MD   650 mg at 11/08/16 1514  . amLODipine (NORVASC) tablet 2.5 mg  2.5 mg Oral Daily Lorella Nimrod, MD   2.5 mg at 11/11/16 1111  . amoxicillin-clavulanate (AUGMENTIN) 500-125 MG per tablet 500 mg  1 tablet Oral Q12H Tawny Asal, MD      . Ampicillin-Sulbactam (UNASYN) 3 g in sodium chloride 0.9 % 100 mL IVPB  3 g Intravenous Once Johny Chess,  [place], MD      . finasteride (PROSCAR) tablet 5 mg  5 mg Oral Daily Lorella Nimrod, MD   5 mg at 11/11/16 1111  . fludrocortisone (FLORINEF) tablet 0.1 mg  0.1 mg Oral Daily Lorella Nimrod, MD   0.1 mg at 11/11/16 1119  . hydrocortisone (CORTEF) tablet 10 mg  10 mg Oral Daily Annia Belt, MD   10 mg at 11/11/16 1119  . hydrocortisone (CORTEF) tablet 5 mg  5 mg Oral QHS Lorella Nimrod, MD   5 mg at 11/10/16 2134  . isosorbide mononitrate (IMDUR) 24 hr tablet 60 mg  60 mg Oral Daily Lorella Nimrod, MD   60 mg at 11/11/16 1112  . levothyroxine (SYNTHROID, LEVOTHROID) tablet 25 mcg   25 mcg Oral QAC breakfast Lorella Nimrod, MD   25 mcg at 11/11/16 1110  . MEDLINE mouth rinse  15 mL Mouth Rinse BID Annia Belt, MD   15 mL at 11/10/16 2141  . metoprolol succinate (TOPROL-XL) 24 hr tablet 25 mg  25 mg Oral Daily Lorella Nimrod, MD   25 mg at 11/10/16 1655  . ondansetron (ZOFRAN) tablet 4 mg  4 mg Oral Q6H PRN Lorella Nimrod, MD       Or  . ondansetron (ZOFRAN) injection 4 mg  4 mg Intravenous Q6H PRN Lorella Nimrod, MD      . polyethylene glycol (MIRALAX / GLYCOLAX) packet 17 g  17 g Oral Daily Tawny Asal, MD   17 g at 11/10/16 0846  . pravastatin (PRAVACHOL) tablet 20 mg  20 mg Oral Daily Lorella Nimrod, MD   20 mg at 11/10/16 0847  . ramelteon (ROZEREM) tablet 8 mg  8 mg Oral QHS Chundi, Vahini, MD   8 mg at 11/10/16 2134  . [place]   Oral PRN Annia Belt, MD      . senna [place]) tablet 8.6 mg  1 tablet Oral Daily Tawny Asal, MD   8.6 mg at 11/11/16 1111  . sertraline (ZOLOFT) tablet 50 mg  50 mg Oral Daily Lorella Nimrod, MD   50 mg at 11/11/16 1111     Discharge Medications: Please see discharge summary for a list of discharge medications.  Relevant Imaging Results:  Relevant Lab Results:   Additional Information SSN: [employer], [place]

## 2016-11-12 ENCOUNTER — Encounter (HOSPITAL_COMMUNITY): Payer: Self-pay

## 2016-11-12 DIAGNOSIS — R159 Full incontinence of feces: Secondary | ICD-10-CM

## 2016-11-12 DIAGNOSIS — I129 Hypertensive chronic kidney disease with stage 1 through stage 4 chronic kidney disease, or unspecified chronic kidney disease: Secondary | ICD-10-CM

## 2016-11-12 LAB — BASIC METABOLIC PANEL
Anion gap: 7 (ref 5–15)
BUN: 46 mg/dL — AB (ref 6–20)
CHLORIDE: 109 mmol/L (ref 101–111)
CO2: 24 mmol/L (ref 22–32)
Calcium: 8.7 mg/dL — ABNORMAL LOW (ref 8.9–10.3)
Creatinine, Ser: 2.26 mg/dL — ABNORMAL HIGH (ref 0.61–1.24)
GFR calc Af Amer: 27 mL/min — ABNORMAL LOW (ref >60)
GFR calc non Af Amer: 24 mL/min — ABNORMAL LOW (ref >60)
GLUCOSE: 89 mg/dL (ref 65–99)
POTASSIUM: 3.6 mmol/L (ref 3.5–5.1)
Sodium: 140 mmol/L (ref 135–145)

## 2016-11-12 LAB — PROTIME-INR
INR: 1.2
Prothrombin Time: 15.1 seconds (ref 11.4–15.2)

## 2016-11-12 LAB — CBC
HEMATOCRIT: 32.1 % — AB (ref 39.0–52.0)
Hemoglobin: 10.5 g/dL — ABNORMAL LOW (ref 13.0–17.0)
MCH: 29.5 pg (ref 26.0–34.0)
MCHC: 32.7 g/dL (ref 30.0–36.0)
MCV: 90.2 fL (ref 78.0–100.0)
Platelets: 209 10*3/uL (ref 150–400)
RBC: 3.56 MIL/uL — ABNORMAL LOW (ref 4.22–5.81)
RDW: 16 % — AB (ref 11.5–15.5)
WBC: 10.7 10*3/uL — ABNORMAL HIGH (ref 4.0–10.5)

## 2016-11-12 MED ORDER — AMOXICILLIN-POT CLAVULANATE 500-125 MG PO TABS: 1.0000 | ORAL_TABLET | Freq: Two times a day (BID) | ORAL | 0 refills | Status: AC

## 2016-11-12 MED ORDER — POLYETHYLENE GLYCOL 3350 17 G PO PACK: 17.0000 g | PACK | Freq: Every day | ORAL | Status: DC | PRN

## 2016-11-12 MED ORDER — WARFARIN SODIUM 5 MG PO TABS
5.0000 mg | ORAL_TABLET | Freq: Once | ORAL | Status: AC
  Administered 2016-11-12: 5 mg via ORAL
  Filled 2016-11-12: qty 1

## 2016-11-12 MED ORDER — SENNA 8.6 MG PO TABS: 1.0000 | ORAL_TABLET | Freq: Every day | ORAL | Status: DC | PRN

## 2016-11-12 NOTE — Progress Notes (Signed)
ANTICOAGULATION CONSULT NOTE - Follow up [place] for warfarin restart Indication: Hx of pulmonary embolus  Allergies  Allergen Reactions  . Aclidinium Bromide Itching and Other (See Comments)    Throat irritation    Patient Measurements: Height: 6' (182.9 cm) Weight: 217 lb 11.2 oz (98.7 kg) IBW/kg (Calculated) : 77.6  Vital Signs: Temp: 97.8 F (36.6 C) (09/12 0610) Temp Source: Oral (09/12 0610) BP: 161/76 (09/12 0610) Pulse Rate: 64 (09/12 0610)  Labs:  Recent Labs  11/10/16 0305 11/11/16 0724 11/12/16 0624  HGB 10.6* 10.7* 10.5*  HCT 33.5* 33.7* 32.1*  PLT 214 230 209  LABPROT 19.5* 15.4* 15.1  INR 1.66 1.23 1.20  CREATININE 2.87* 2.53*  --     Estimated Creatinine Clearance: 22.7 mL/min (A) (by C-G formula based on SCr of 2.53 mg/dL (H)).   Medical History: Past Medical History:  Diagnosis Date  . Aspiration pneumonia ([place])   . BPH (benign prostatic hyperplasia)   . CAD (coronary artery disease)    s/p stent  . CKD (chronic kidney disease)   . COPD (chronic obstructive pulmonary disease) ([place])   . Depression   . GERD (gastroesophageal reflux disease)   . HLD (hyperlipidemia)   . PE (pulmonary embolism)     Assessment: 51 yoM on warfarin prior to admission for history of PE. Was being held for hemoptysis and has not had a dose since at least 9/8 but maybe missed doses prior to admission. Pharmacy has been consulted to restart warfarin. INR on admit was 1.83, today it is down to 1.2. CBC stable, SCDs on. Patient was supposed to restart warfarin yesterday, however he was not given the ordered dose. I have ordered a dose to give now and communicated this with the RN.  PTA dose: Warfarin 4 mg PO daily on Monday and Friday and 2 mg on all other days  Goal of Therapy:  INR 2-3 Monitor platelets by anticoagulation protocol: Yes   Plan:  Give warfarin 5 mg po now Monitor daily INR, CBC, clinical course, s/sx of bleed, PO intake,  DDI   Thank you for allowing [place] to participate in this patients care.  Jens Som, PharmD Clinical phone for 11/12/2016 from 7a-3:30p: x [phone] If after 3:30p, please call main pharmacy at: [phone] 11/12/2016 8:44 AM

## 2016-11-12 NOTE — Progress Notes (Signed)
PT Cancellation Note  Patient Details Name: Abyan Cadman MRN: [id number] DOB: 12-02-1922   Cancelled Treatment:    Reason Eval/Treat Not Completed: Patient declined, no reason specified.  Pt reported having problems with stool and didn't want to get OOB. 11/12/2016  Donnella Sham, PT [phone]  (pager)   Tessie Fass Delisa Finck 11/12/2016, 12:45 PM

## 2016-11-12 NOTE — Progress Notes (Signed)
  Speech Language Pathology Treatment: Dysphagia  Patient Details Name: Leonard Shepard MRN: [id number] DOB: [birth year] Today's Date: 11/12/2016 Time: 1040-1100 SLP Time Calculation (min) (ACUTE ONLY): 20 min  Assessment / Plan / Recommendation Clinical Impression  Pt seen at bedside fir assessment of diet tolerance and education. Pt reports occasional cough, and admits to not completing chin tuck consistently. Reminder to tuck chin with liquids was posted in front of patient so he can read it. Pt accepted trials of water and completed chin tuck with each swallow. Pt reports he does not feel that he needs thickener in either water or coffee. Rationale for thickened liquids was explained, and pt was educated on purpose and procedure with the water protocol. Pt exhibited penetration to the vocal folds on both thin and nectar per MBS.   Safe swallow precautions were reviewed, and pt was encouraged to adhere to them. ST will continue to follow for assessment of diet tolerance and education. Pt reports DC to rehab is pending in the next few days.   HPI HPI: Leonard Shepard is a 83 yo M with a past medical history of CAD, CKD IV (hx of nephrectomy x 70 yrs), pulmonary fibrosis, PE (on warfarin), adrenal insufficiency who presented to the ED with complaints of fever, hemoptysis/hematemesis. The details of the history were provided by his son who he currently lives with.   This morning the patient woke up and felt cold with chills and was generally weak with increased work of breathing. His son took his temperature with a thermometer which was 101. He began coughing and spitting up bright red blood. He also had foul-smelling vomit, also with blood. The patient was in his usual state of health yesterday, last night noted to feel queasy and did not go out to dinner with family. No other recent complaints of increased cough, nausea vomiting or diarrhea, or other symptoms. Of note, he was admitted at an outside hospital  on 3/30 for pneumonia. His son states he also had hemoptysis, fever at that time. His family denies any overt signs of aspiration in the patient.       SLP Plan  Continue with current plan of care       Recommendations  Diet recommendations: Regular;Nectar-thick liquid (water between meals) Liquids provided via: Cup;No straw Medication Administration: Whole meds with liquid Supervision: Full supervision/cueing for compensatory strategies;Patient able to self feed Compensations: Slow rate;Small sips/bites;Chin tuck;Minimize environmental distractions Postural Changes and/or Swallow Maneuvers: Seated upright 90 degrees;Upright 30-60 min after meal;Chin tuck                Oral Care Recommendations: Oral care before and after PO;Oral care BID Follow up Recommendations: Home health SLP SLP Visit Diagnosis: Dysphagia, oropharyngeal phase (R13.12) Plan: Continue with current plan of care       GO              Leonard Shepard [facility], CCC-SLP Speech Language Pathologist [phone]  Shonna Chock 11/12/2016, 11:01 AM

## 2016-11-12 NOTE — Clinical Social Work Placement (Signed)
   CLINICAL SOCIAL WORK PLACEMENT  NOTE  Date:  11/12/2016  Patient Details  Name: Leonard Shepard MRN: [id number] Date of Birth: 1922-09-28  Clinical Social Work is seeking post-discharge placement for this patient at the [place] level of care (*CSW will initial, date and re-position this form in  chart as items are completed):  Yes   Patient/family provided with [place] Work Department's list of facilities offering this level of care within the geographic area requested by the patient (or if unable, by the patient's family).  Yes   Patient/family informed of their freedom to choose among providers that offer the needed level of care, that participate in Medicare, Medicaid or managed care program needed by the patient, have an available bed and are willing to accept the patient.  Yes   Patient/family informed of [facility]'s ownership interest in [facility] and [facility], as well as of the fact that they are under no obligation to receive care at these facilities.  PASRR submitted to EDS on 11/11/16     PASRR number received on 11/11/16     Existing PASRR number confirmed on       FL2 transmitted to all facilities in geographic area requested by pt/family on 11/11/16     FL2 transmitted to all facilities within larger geographic area on       Patient informed that his/her managed care company has contracts with or will negotiate with certain facilities, including the following:        Yes   Patient/family informed of bed offers received.  Patient chooses bed at [facility]     Physician recommends and patient chooses bed at      Patient to be transferred to [facility] on 11/12/16.  Patient to be transferred to facility by [facility]AR     Patient family notified on 11/12/16 of transfer.  Name of family member notified:  Son     PHYSICIAN Please sign FL2     Additional Comment:     _______________________________________________ Benard Halsted, [place] 11/12/2016, 11:26 AM

## 2016-11-12 NOTE — Progress Notes (Signed)
Internal Medicine Attending:   I saw and examined the patient. I reviewed the resident's note and I agree with the resident's findings and plan as documented in the resident's note. Patient continues to feel better, main complaint today is generalized weakness.  On exam he continues to have some right base crackles, otherwise O2 was down to 1.5L via [place] with no distress.   A/P Aspiration PNA - Continue Augmentin 10 day total course -Pulmonary toliet -SLP  -Wean O2  Essential HTN/ Adrenal Insuffiencey remains mildly HTN, on home doses of medications.  I suspect this is due to high salt load of Unasyn and IVF on presentation.  I think we will continue with the current plan, mainatin low salt diet.  If remains HTN I would first consdier cutting dose of florinef to 0.05mg  daily.  Dipso: to SNF suspect he will be ready to be discharged tomorrow.

## 2016-11-12 NOTE — Discharge Summary (Signed)
Name: Leonard Shepard MRN: [id number] DOB: May 26, 1922 83 y.o. PCP: System, Pcp Not In  Date of Admission: 11/08/2016  7:57 AM Date of Discharge: 11/12/2016 Attending Physician: Leonard Groves, DO  Discharge Diagnosis: Principal Problem:   Aspiration pneumonia [facility]) Active Problems:   Hemoptysis   Chronic anticoagulation   History of adrenal insufficiency   Postinflammatory pulmonary fibrosis ([place])   H/O unilateral nephrectomy   Hx pulmonary embolism   Oropharyngeal dysphagia   Discharge Medications: Allergies as of 11/12/2016      Reactions   Aclidinium Bromide Itching, Other (See Comments)   Throat irritation      Medication List    STOP taking these medications   nizatidine 300 MG capsule Commonly known as:  AXID     TAKE these medications   amLODipine 2.5 MG tablet Commonly known as:  NORVASC Take 2.5 mg by mouth daily.   amoxicillin-clavulanate 500-125 MG tablet Commonly known as:  AUGMENTIN Take 1 tablet (500 mg total) by mouth every 12 (twelve) hours.   colesevelam 625 MG tablet Commonly known as:  WELCHOL Take 1,875 mg by mouth 2 (two) times daily with a meal.   diphenhydramine-acetaminophen 25-500 MG Tabs tablet Commonly known as:  TYLENOL PM Take 2 tablets by mouth at bedtime.   fenofibrate 160 MG tablet Take 160 mg by mouth daily.   finasteride 5 MG tablet Commonly known as:  PROSCAR Take 5 mg by mouth daily.   fludrocortisone 0.1 MG tablet Commonly known as:  FLORINEF Take 0.1 mg by mouth daily.   fluticasone 50 MCG/ACT nasal spray Commonly known as:  FLONASE Place 1 spray into both nostrils daily.   furosemide 20 MG tablet Commonly known as:  LASIX Take 20 mg by mouth daily as needed for fluid.   guaifenesin 100 MG/5ML syrup Commonly known as:  ROBITUSSIN Take 200 mg by mouth every 6 (six) hours as needed for cough.   hydrocortisone 10 MG tablet Commonly known as:  CORTEF Take 1 tablet in the morning and half a tablet at  night. What changed:  how much to take  how to take this  when to take this  additional instructions   hydrocortisone 2.5 % cream Apply 1 application topically 2 (two) times daily.   isosorbide mononitrate 60 MG 24 hr tablet Commonly known as:  IMDUR Take 60 mg by mouth daily.   levothyroxine 25 MCG tablet Commonly known as:  SYNTHROID, LEVOTHROID Take 25 mcg by mouth daily before breakfast.   metoprolol succinate 25 MG 24 hr tablet Commonly known as:  TOPROL-XL Take 25 mg by mouth daily.   ondansetron 4 MG tablet Commonly known as:  ZOFRAN Take 4 mg by mouth every 8 (eight) hours as needed for nausea or vomiting.   pantoprazole 40 MG tablet Commonly known as:  PROTONIX Take 40 mg by mouth daily.   pravastatin 20 MG tablet Commonly known as:  PRAVACHOL Take 20 mg by mouth daily.   sertraline 50 MG tablet Commonly known as:  ZOLOFT Take 50 mg by mouth daily.   SLOW-MAG PO Take 1 tablet by mouth daily.   warfarin 4 MG tablet Commonly known as:  COUMADIN Take 2-4 mg by mouth See admin instructions. Take 4 mg by mouth daily on Monday and Friday. Take 2 mg by mouth daily on all other days            Discharge Care Instructions        Start     Ordered  11/12/16 0000  amoxicillin-clavulanate (AUGMENTIN) 500-125 MG tablet  Every 12 hours     11/12/16 1440   11/12/16 0000  Increase activity slowly     11/12/16 1440   11/12/16 0000  Diet - low sodium heart healthy     11/12/16 1440   11/12/16 0000  Discharge instructions    Comments:  It was a pleasure taking care of you Leonard Shepard -Continue taking the Augmentin antibiotics through September 18th.  -Work with physical therapy and rehab at the facility to regain your strength. -Keep using the strategies the speech therapist worked with you on to help improve your swallowing   11/12/16 1440      Disposition and follow-up:   Leonard Shepard was discharged from [facility] in Stable  condition.  At the hospital follow up visit please address:  1.  -Continue Augmentin 500 mg BID until 11/18/16 -Ensure continued improvement in cough, remaining afebrile -Wean O2 back to room air  -Assess for return of kidney function to baseline (Cr of ~2)  -Continue regular diet but with nectar thick liquids and chin tuck with all swallowing to limit aspiration, speech therapy if available  -Continue PT to improve functional status  -Consider reducing fludrocortisone dose if his blood pressure is consistently elevated, details below -Monitor INR, adjust Warfarin dose as necessary   2.  Labs / imaging needed at time of follow-up: BMP  3.  Pending labs/ test needing follow-up: None  Follow-up Appointments: Contact information for after-discharge care    Destination    [facility] .   Specialty:  [place] information: [address] [place] [facility] Course by problem list:   Aspiration Pneumonia Patient presented with fever, hemoptysis, increased oxygen requirement and was found to have imaging evidence of pneumonia in his right middle and lower lobe. He also had hypotension, increased lactic acid consistent with sepsis requiring IV fluid resuscitation. His blood pressure responded well and he remained hemodynamically stable. He did have an admission for pneumonia in March, treated with Cefdinir and doxycycline. Given the location there was concern for aspiration pneumonia and he was started on IV Unasyn for anaerobic coverage after receiving ceftriaxone and azithromycin ED. Strep pneumo antigen was negative, sputum culture was insufficient to assess for organisms. He responded well to Unasyn and clinically improved with his breathing and cough. E was transitioned to oral Augmentin dose to 500 mg twice a day with his underlying kidney disease. He will complete a total of 10 days of  antibiotics with the course ending on September 18. His oxygen requirement was slowly weaned down to 2 L at the time of discharge. He can likely return to room air with further weaning as he continues to improve.    Oropharyngeal Dysphagia  Family and patient initially denied overt signs of aspiration during eating. He did later note a previous evaluation by ENT in the past who mentioned a "leak". He was initially nothing by mouth until a swallow evaluation could be performed which showed evidence of oropharyngeal dysphagia, recommended a regular diet with nectar thick liquids and chin tuck to decrease dysphagia and aspiration risk. He may benefit from further speech therapy if available.  Acute on Chronic Kidney Injury (CKD IV) Patient has remote history of right nephrectomy after trauma suffered in [place] and has chronic kidney disease stage IV with  a baseline creatinine of around 2. His initial creatinine on admission was elevated to 2.6, likely due to hypotension associated with his infection. After fluid resuscitation and supportive management his kidney function improved and was approaching his baseline, Cr 2.26 at time of discharge.  History of Adrenal Insufficiency  Patient has a history of adrenal insufficiency, currently treated with hydrocortisone and fludrocortisone. He reports starting these medications roughly 18 months ago and was told his body was not producing enough steroid, no further details on prior workup available. He received 2 doses of Solu-Medrol 80 mg for stress dose steroids during acute illness, home steroids were also continued. Following fluid resuscitation he was mildly hypertensive which may have been related to volume and salt load. However, if he continues to have elevated blood pressure he may benefit from a dose reduction of his fludrocortisone from 0.1 mg to 0.05 mg.   Constipation At time of admission, imaging revealed a large stool burden consistent with  constipation. He was started on a scheduled bowel regimen with relief of his constipation and regular, looser BMs. His bowel regimen was transitioned to as needed administrations.    History of Pulmonary Fibrosis Pt has a history of pulmonary fibrosis of unknown etiology, following with Pulmonology as an outpatient. His is not on supplemental oxygen at home and he should return to room air, though the weaning process may be slower given his underlying lung disease     History of PE Patient has a remote history of pulmonary embolism, details again unclear as patient recently moved from [place]. He is on chronic warfarin therapy. His anticoagulation was initially held due to hemoptysis and then resumed with resolution. rior to admission he was taking 4 mg on Monday and Friday and 2 mg on other days. He was given a 5 mg dose prior to discharge and resumed on his previous regimen.  Discharge Vitals:   BP (!) 152/68 (BP Location: Right Arm)   Pulse 63   Temp (!) 97.5 F (36.4 C) (Oral)   Resp 14   Ht 6' (1.829 m)   Wt 217 lb 11.2 oz (98.7 kg)   SpO2 92%   BMI 29.53 kg/m   Pertinent Labs, Studies, and Procedures:  BMP Latest Ref Rng & Units 11/12/2016 11/11/2016 11/10/2016  Glucose 65 - 99 mg/dL 89 120(H) 118(H)  BUN 6 - 20 mg/dL 46(H) 52(H) 45(H)  Creatinine 0.61 - 1.24 mg/dL 2.26(H) 2.53(H) 2.87(H)  Sodium 135 - 145 mmol/L 140 140 141  Potassium 3.5 - 5.1 mmol/L 3.6 4.1 4.4  Chloride 101 - 111 mmol/L 109 110 114(H)  CO2 22 - 32 mmol/L 24 23 21(L)  Calcium 8.9 - 10.3 mg/dL 8.7(L) 8.9 8.7(L)   CBC Latest Ref Rng & Units 11/12/2016 11/11/2016 11/10/2016  WBC 4.0 - 10.5 K/uL 10.7(H) 11.3(H) 16.1(H)  Hemoglobin 13.0 - 17.0 g/dL 10.5(L) 10.7(L) 10.6(L)  Hematocrit 39.0 - 52.0 % 32.1(L) 33.7(L) 33.5(L)  Platelets 150 - 400 K/uL 209 230 214    Discharge Instructions: Discharge Instructions    Diet - low sodium heart healthy    Complete by:  As directed    Discharge instructions    Complete  by:  As directed    It was a pleasure taking care of you Mr. Veasey -Continue taking the Augmentin antibiotics through September 18th.  -Work with physical therapy and rehab at the facility to regain your strength. -Keep using the strategies the speech therapist worked with you on to help improve your swallowing  Increase activity slowly    Complete by:  As directed       Signed: Tawny Asal, MD 11/12/2016, 2:40 PM   Pager: [phone]

## 2016-11-12 NOTE — Discharge Instructions (Signed)

## 2016-11-12 NOTE — Progress Notes (Signed)
   Subjective: No acute events overnight, pt was transitioned to oral antibiotics without issue. He notes improved breathing and coughing, though states he has a small amount of fecal incontinence with some coughing spells, wishes to alter bowel regimen to avoid this.    Both pt and pt's son are agreeable to SNF placement to help increase his functional status.   Objective:  Vital signs in last 24 hours: Vitals:   11/11/16 1413 11/11/16 2106 11/11/16 2153 11/12/16 0610  BP: (!) 150/77 (!) 156/78  (!) 161/76  Pulse: (!) 58 60 (!) 59 64  Resp: 18 17 18 18   Temp: 98.1 F (36.7 C) 97.7 F (36.5 C)  97.8 F (36.6 C)  TempSrc: Oral Oral  Oral  SpO2:  96% 95% 93%  Weight:      Height:       Physical Exam  Constitutional: He is oriented to person, place, and time.  Elderly gentleman resting in bed comfortably on [place], no acute distress   HENT:  Head: Normocephalic and atraumatic.  Cardiovascular: Normal rate, regular rhythm and normal heart sounds.   Pulmonary/Chest:  Still with R sided crackles, sating well on 2 L [place], overall interval improvement in lung exam  Abdominal: Soft. He exhibits no distension. There is no tenderness.  Neurological: He is alert and oriented to person, place, and time.    Assessment/Plan:  RML and RLL Pneumonia, Hypoxia, Oropharyngeal dysphagia Pt presented with fever, hemoptysis, increased oxygen requirement with imaging evidence of pneumonia to R lobes and sepsis with hypotension, increased LA. Initially treated with Unasyn, antibiotics have been transitioned to PO Augmentin, plan for total of 10 days abx course. His pna location was concerning for aspiration, subsequent evaluation revealed oropharyngeal dysphagia, appreciate SLP recommendations. PT/OT recommend SNF placement   --Monitor vital signs  --Continue PO Augmentin  --Chest PT, Incentive spirometry   --Diet per SLP  --Supplemental O2 prn, wean to RA as tolerated    Acute on Chronic Kidney  Injury (CKD IV)  Pt is s/p remote R nephrectomy (d/t to trauma), baseline appears to 1.9-2.2. Cr peaked at 3.13, AKI likely related to hypotension in the setting of sepsis as above. His Cr is now down-trending, currently 2.26.   --BMP, avoid nephrotoxic agents  Adrenal Insufficiency Pt is unable to provide full details of previous workup in [place], however states he was told his body did not have enough steroid and was placed on cortef, florinef ~18 months ago. He is s/p stress dose steroids x2 doses. He is mildly hypertensive, may be related to fluid and salt load during IVF resuscitation, will continue to monitor.   --Continue home cortef, florinef  History of PE Pt on chronic warfarin therapy for history of PE, unclear details. His Warfarin was initially held in the setting of hemoptysis and can now be resumed.  --Warfarin per pharmacy   Constipation Imaging evidence of constipation, large stool burden, likely contributed to emesis which has now resolved. He feels his constipation is relieved and now having some fecal incontinence with cough.  --Bowel regimen to prn --Zofran 4 mg q6hr prn        Dispo: Anticipated discharge in approximately 1-2 day(s) pending SNF placement.   Tawny Asal, MD 11/12/2016, 7:17 AM Pager: [phone]

## 2016-11-12 NOTE — Care Management Note (Signed)
Case Management Note  Patient Details  Name: Leonard Shepard MRN: [id number] Date of Birth: 12-24-1922  Subjective/Objective:       Admitted with aspiration PNA.            Katheran Awe. (Son)     [phone]       Action/Plan: Plan is to d/c to SNF pending oxygen wean today. CSW managing disposition to facility.  Expected Discharge Date:   11/12/2016           Expected Discharge Plan:  [place]  In-House Referral:  Clinical Social Work  Discharge planning Services  CM Consult  Status of Service:  Completed, signed off  If discussed at [employer] of Stay Meetings, dates discussed:    Additional Comments:  Sharin Mons, RN 11/12/2016, 10:36 AM

## 2016-11-12 NOTE — Progress Notes (Signed)
Patient report called to [facility] to [place], Therapist, sports. Patient transported by [facility]. He has his hearing aid, clothing, cell phone and charger.

## 2016-11-13 LAB — CULTURE, BLOOD (ROUTINE X 2)
Culture: NO GROWTH
Culture: NO GROWTH
Special Requests: ADEQUATE
Special Requests: ADEQUATE

## 2016-11-14 ENCOUNTER — Encounter: Payer: Self-pay | Admitting: Internal Medicine

## 2016-11-14 ENCOUNTER — Non-Acute Institutional Stay (SKILLED_NURSING_FACILITY): Payer: Medicare Other | Admitting: Internal Medicine

## 2016-11-14 DIAGNOSIS — Z86711 Personal history of pulmonary embolism: Secondary | ICD-10-CM

## 2016-11-14 DIAGNOSIS — E273 Drug-induced adrenocortical insufficiency: Secondary | ICD-10-CM

## 2016-11-14 DIAGNOSIS — N184 Chronic kidney disease, stage 4 (severe): Secondary | ICD-10-CM

## 2016-11-14 DIAGNOSIS — T380X5A Adverse effect of glucocorticoids and synthetic analogues, initial encounter: Secondary | ICD-10-CM

## 2016-11-14 DIAGNOSIS — Z8639 Personal history of other endocrine, nutritional and metabolic disease: Secondary | ICD-10-CM

## 2016-11-14 DIAGNOSIS — I10 Essential (primary) hypertension: Secondary | ICD-10-CM | POA: Diagnosis not present

## 2016-11-14 DIAGNOSIS — R4702 Dysphasia: Secondary | ICD-10-CM | POA: Diagnosis not present

## 2016-11-14 DIAGNOSIS — J690 Pneumonitis due to inhalation of food and vomit: Secondary | ICD-10-CM

## 2016-11-14 DIAGNOSIS — E039 Hypothyroidism, unspecified: Secondary | ICD-10-CM

## 2016-11-14 DIAGNOSIS — N179 Acute kidney failure, unspecified: Secondary | ICD-10-CM

## 2016-11-14 DIAGNOSIS — A419 Sepsis, unspecified organism: Secondary | ICD-10-CM

## 2016-11-14 NOTE — Progress Notes (Signed)
: Provider:  Noah Delaine. Leonard Coil, MD Location:  [facility] Room Number: [phone] Place of Service:  SNF (31)  PCP: System, Pcp Not In Patient Care Team: System, Pcp Not In as PCP - General  Extended Emergency Contact Information Primary Emergency Contact: Roseanna Rainbow [place] Phone: [phone] Mobile Phone: [phone] Relation: Son     Allergies: Aclidinium bromide  Chief Complaint  Patient presents with  . New Admit To SNF    following hospitalization 11/08/16 11/12/16 aspiration pneumonia    HPI: Patient is 83 y.o. male with pulmonary fibrosis, COPD, coronary artery disease status post stent, chronic PE on warfarin, chronic kidney disease stage III, adrenal insufficiency on cortisone replacement, hypothyroidism, solitary left kidney status post resection secondary to trauma in World War II who presented to [facility] emergency department with generalized weakness chills fever of 101 cough with associated hemoptysis, vomiting with question of hematemesis O2 saturation was 88% and patient was placed on CPAP en route to hospital. On arrival to hospital patient's blood pressure 75/55 pulse 90 respirations 24 temperature 100.2 with chest x-ray showing patchy right middle and right lower lobe infiltrates patient was admitted to [facility] from 9/8[facility]12 for treatment of aspiration pneumonia and acute on chronic kidney disease. Patient is admitted to skilled nursing facility with generalized weakness for OT/PT. While at skilled nursing facility patient will be followed for hypertension treated with Norvasc, Lasix, metoprolol, and imdur, adrenal insufficiency treated with Florinef and history of PE on chronic Coumadin.  Past Medical History:  Diagnosis Date  . Aspiration pneumonia ([place])   . BPH (benign prostatic hyperplasia)   . CAD (coronary artery disease)    s/p stent  . Chronic renal insufficiency, stage III (moderate)   . CKD  (chronic kidney disease)   . COPD (chronic obstructive pulmonary disease) ([place])   . Depression   . GERD (gastroesophageal reflux disease)   . H/O unilateral nephrectomy   . HLD (hyperlipidemia)   . Oropharyngeal dysphagia 11/10/2016  . PE (pulmonary embolism)   . Postinflammatory pulmonary fibrosis ([facility])     Past Surgical History:  Procedure Laterality Date  . APPENDECTOMY    . NEPHRECTOMY Right     Allergies as of 11/14/2016      Reactions   Aclidinium Bromide Itching, Other (See Comments)   Throat irritation      Medication List       Accurate as of 11/14/16 10:07 AM. Always use your most recent med list.          amLODipine 2.5 MG tablet Commonly known as:  NORVASC Take 2.5 mg by mouth daily.   amoxicillin-clavulanate 500-125 MG tablet Commonly known as:  AUGMENTIN Take 1 tablet (500 mg total) by mouth every 12 (twelve) hours.   colesevelam 625 MG tablet Commonly known as:  WELCHOL Take 1,875 mg by mouth 2 (two) times daily with a meal.   diphenhydramine-acetaminophen 25-500 MG Tabs tablet Commonly known as:  TYLENOL PM Take 2 tablets by mouth at bedtime.   fenofibrate 160 MG tablet Take 160 mg by mouth daily.   finasteride 5 MG tablet Commonly known as:  PROSCAR Take 5 mg by mouth daily.   fludrocortisone 0.1 MG tablet Commonly known as:  FLORINEF Take 0.1 mg by mouth daily.   fluticasone 50 MCG/ACT nasal spray Commonly known as:  FLONASE Place 1 spray into both nostrils daily.   furosemide 20 MG tablet Commonly known as:  LASIX  Take 20 mg by mouth daily as needed for fluid.   guaifenesin 100 MG/5ML syrup Commonly known as:  ROBITUSSIN Take 200 mg by mouth every 6 (six) hours as needed for cough.   hydrocortisone 10 MG tablet Commonly known as:  CORTEF Take 1 tablet in the morning and half a tablet at night.   hydrocortisone 2.5 % cream Apply 1 application topically 2 (two) times daily.   isosorbide mononitrate 60 MG 24 hr tablet Commonly  known as:  IMDUR Take 60 mg by mouth daily.   levothyroxine 25 MCG tablet Commonly known as:  SYNTHROID, LEVOTHROID Take 25 mcg by mouth daily before breakfast.   metoprolol succinate 25 MG 24 hr tablet Commonly known as:  TOPROL-XL Take 25 mg by mouth daily.   ondansetron 4 MG tablet Commonly known as:  ZOFRAN Take 4 mg by mouth every 8 (eight) hours as needed for nausea or vomiting.   pantoprazole 40 MG tablet Commonly known as:  PROTONIX Take 40 mg by mouth daily.   pravastatin 20 MG tablet Commonly known as:  PRAVACHOL Take 20 mg by mouth daily.   sertraline 50 MG tablet Commonly known as:  ZOLOFT Take 50 mg by mouth daily.   SLOW-MAG PO Take 1 tablet by mouth daily.   warfarin 4 MG tablet Commonly known as:  COUMADIN Take 2-4 mg by mouth See admin instructions. Take 4 mg by mouth daily on Monday and Friday. Take 2 mg by mouth daily on all other days       No orders of the defined types were placed in this encounter.   Immunization History  Administered Date(s) Administered  . PPD Test 11/12/2016    Social History  Substance Use Topics  . Smoking status: Never Smoker  . Smokeless tobacco: Never Used  . Alcohol use No    Family history is   Family History  Problem Relation Age of Onset  . Lung disease Father   . Leukemia Brother       Review of Systems  DATA OBTAINED: from patient GENERAL:  no fevers, fatigue, appetite changes SKIN: No itching, or rash EYES: No eye pain, redness, discharge EARS: No earache, tinnitus, change in hearing NOSE: No congestion, drainage or bleeding  MOUTH/THROAT: No mouth or tooth pain, No sore throat RESPIRATORY: No cough, wheezing, SOB CARDIAC: No chest pain, palpitations, lower extremity edema  GI: No abdominal pain, No N/V/D or constipation, No heartburn or reflux  GU: No dysuria, frequency or urgency, or incontinence  MUSCULOSKELETAL: No unrelieved bone/joint pain NEUROLOGIC: No headache, dizziness or focal  weakness PSYCHIATRIC: No c/o anxiety or sadness   Vitals:   11/14/16 0948  BP: (!) 148/65  Pulse: 82  Resp: (!) 22  Temp: 99 F (37.2 C)  SpO2: 97%    SpO2 Readings from Last 1 Encounters:  11/14/16 97%   Body mass index is 29.43 kg/m.     Physical Exam  GENERAL APPEARANCE: Alert, conversant,  No acute distress.  SKIN: No diaphoresis rash HEAD: Normocephalic, atraumatic  EYES: Conjunctiva/lids clear. Pupils round, reactive. EOMs intact.  EARS: External exam WNL, canals clear. Hearing grossly normal.  NOSE: No deformity or discharge.  MOUTH/THROAT: Lips w/o lesions  RESPIRATORY: Breathing is even, unlabored. Lung sounds are Mildly rhonchi, no rails  CARDIOVASCULAR: Heart RRR no murmurs, rubs or gallops. No peripheral edema.   GASTROINTESTINAL: Abdomen is soft, non-tender, not distended w/ normal bowel sounds. GENITOURINARY: Bladder non tender, not distended  MUSCULOSKELETAL: No abnormal joints or  musculature NEUROLOGIC:  Cranial nerves 2-12 grossly intact. Moves all extremities  PSYCHIATRIC: Mood and affect appropriate to situation, no behavioral issues  Patient Active Problem List   Diagnosis Date Noted  . Abdominal aortic atherosclerosis ([place]) 11/10/2016  . Oropharyngeal dysphagia 11/10/2016  . Hemoptysis   . Chronic anticoagulation   . S/P coronary artery stent placement   . Acquired hypothyroidism   . History of adrenal insufficiency   . Postinflammatory pulmonary fibrosis ([place])   . H/O unilateral nephrectomy   . Hx pulmonary embolism   . Aspiration pneumonia ([place]) 11/08/2016  . Orthostasis 06/11/2015  . Sepsis ([place]) 06/11/2015  . Nausea & vomiting 06/11/2015  . Abdominal pain 06/11/2015  . HLD (hyperlipidemia)   . GERD (gastroesophageal reflux disease)   . CAD (coronary artery disease)   . PE (pulmonary embolism)   . BPH (benign prostatic hyperplasia)   . Chronic renal insufficiency, stage III (moderate)   . COPD (chronic obstructive pulmonary disease)  ([place])   . Depression       Labs reviewed: Basic Metabolic Panel:    Component Value Date/Time   NA 140 11/12/2016 0624   K 3.6 11/12/2016 0624   CL 109 11/12/2016 0624   CO2 24 11/12/2016 0624   GLUCOSE 89 11/12/2016 0624   BUN 46 (H) 11/12/2016 0624   CREATININE 2.26 (H) 11/12/2016 0624   CALCIUM 8.7 (L) 11/12/2016 0624   PROT 6.1 (L) 11/08/2016 0815   ALBUMIN 3.3 (L) 11/08/2016 0815   AST 35 11/08/2016 0815   ALT 17 11/08/2016 0815   ALKPHOS 56 11/08/2016 0815   BILITOT 0.9 11/08/2016 0815   GFRNONAA 24 (L) 11/12/2016 0624   GFRAA 27 (L) 11/12/2016 0624     Recent Labs  11/10/16 0305 11/11/16 0724 11/12/16 0624  NA 141 140 140  K 4.4 4.1 3.6  CL 114* 110 109  CO2 21* 23 24  GLUCOSE 118* 120* 89  BUN 45* 52* 46*  CREATININE 2.87* 2.53* 2.26*  CALCIUM 8.7* 8.9 8.7*   Liver Function Tests:  Recent Labs  11/08/16 0815  AST 35  ALT 17  ALKPHOS 56  BILITOT 0.9  PROT 6.1*  ALBUMIN 3.3*    Recent Labs  11/08/16 0815  LIPASE 56*   No results for input(s): AMMONIA in the last 8760 hours. CBC:  Recent Labs  11/08/16 0815  11/10/16 0305 11/11/16 0724 11/12/16 0624  WBC 10.4  < > 16.1* 11.3* 10.7*  NEUTROABS 8.0*  --   --   --   --   HGB 12.6*  < > 10.6* 10.7* 10.5*  HCT 39.6  < > 33.5* 33.7* 32.1*  MCV 91.7  < > 91.5 91.1 90.2  PLT 256  < > 214 230 209  < > = values in this interval not displayed. Lipid No results for input(s): CHOL, HDL, LDLCALC, TRIG in the last 8760 hours.  Cardiac Enzymes: No results for input(s): CKTOTAL, CKMB, CKMBINDEX, TROPONINI in the last 8760 hours. BNP: No results for input(s): BNP in the last 8760 hours. No results found for: MICROALBUR No results found for: HGBA1C No results found for: TSH No results found for: VITAMINB12 No results found for: FOLATE No results found for: IRON, TIBC, FERRITIN  Imaging and Procedures obtained prior to SNF admission: Ct Abdomen Pelvis Wo Contrast  Result Date:  11/08/2016 CLINICAL DATA:  Patient with vomiting.  Back pain. EXAM: CT ABDOMEN AND PELVIS WITHOUT CONTRAST TECHNIQUE: Multidetector CT imaging of the abdomen and pelvis was performed  following the standard protocol without IV contrast. COMPARISON:  CT abdomen pelvis 06/11/2015 FINDINGS: Lower chest: Heart is mildly enlarged. Coronary arterial vascular calcifications. Interval progression of subpleural reticular opacities with associated architectural distortion. No pleural effusion. Hepatobiliary: Liver is normal in size and contour. Status post cholecystectomy. Pancreas: Unremarkable. Spleen: Unremarkable Adrenals/Urinary Tract: The adrenal glands are normal. Right kidney is surgically absent. Stable appearance of the surgical bed. Stable appearance of the left kidney. No hydronephrosis. Mild urinary bladder wall thickening. Stomach/Bowel: Large amount of stool within the rectum. Descending and sigmoid colonic diverticulosis. No CT evidence for acute diverticulitis. Vascular/Lymphatic: Peripheral calcified atherosclerotic plaque involving the abdominal aorta. Infrarenal abdominal aortic ectasia measuring 3.8 cm, previously 3.5 cm. No retroperitoneal lymphadenopathy. Reproductive: Prostate unremarkable. Other: None. Musculoskeletal: Thoracic and lumbar spine degenerative changes. No aggressive or acute appearing osseous lesions. IMPRESSION: Large amount of stool within the rectum compatible with constipation. Descending and sigmoid colonic diverticulosis without CT evidence for acute diverticulitis. Infrarenal abdominal aortic aneurysm, slightly increased from prior measuring 3.8 cm. Recommend followup by ultrasound in 2 years. This recommendation follows ACR consensus guidelines: White Paper of the ACR Incidental Findings Committee II on Vascular Findings. J Am Coll Radiol 2013; 10:789-794. Interval progression of subpleural reticular opacities and architectural distortion most compatible with progressed pulmonary  fibrotic change. In the nonacute setting, recommend further evaluation with high-resolution chest CT. Mild urinary bladder wall thickening. Recommend correlation with urinalysis as infection not excluded. Electronically Signed   By: Lovey Newcomer M.D.   On: 11/08/2016 13:09   US Renal  Result Date: 11/09/2016 CLINICAL DATA:  Patient with oliguria and an urea. Status post right renal removal. EXAM: RENAL / URINARY TRACT ULTRASOUND COMPLETE COMPARISON:  None. FINDINGS: Right Kidney: Surgically absent. Left Kidney: Length: 14.7 cm. Normal renal cortical thickness and echogenicity. Mild cortical lobulation. No hydronephrosis. Bladder: Decompressed with Foley catheter. IMPRESSION: No hydronephrosis. Electronically Signed   By: Lovey Newcomer M.D.   On: 11/09/2016 10:52   Nm Pulmonary Vent And Perf (v/q Scan)  Result Date: 11/08/2016 CLINICAL DATA:  Evaluate for pulmonary embolism EXAM: NUCLEAR MEDICINE VENTILATION - PERFUSION LUNG SCAN TECHNIQUE: Ventilation images were obtained in multiple projections using inhaled aerosol Tc-52m DTPA. Perfusion images were obtained in multiple projections after intravenous injection of Tc-11m MAA. RADIOPHARMACEUTICALS:  29.6 mCi Technetium-53m DTPA aerosol inhalation and 4.1 mCi Technetium-35m MAA IV COMPARISON:  Chest radiograph - earlier same day ; 06/10/2015 FINDINGS: Review of chest radiograph performed earlier today demonstrates an enlarged cardiac silhouette and mediastinal contours. Interval development of ill-defined heterogeneous airspace opacities within the right mid and lower lung worrisome for areas of infection. Pulmonary vasculature appears indistinct with cephalization of flow. Small bilateral effusions are not excluded. Ventilation: There is clumping of inhaled radiotracer about the bilateral pulmonary hila, left greater than right, with overall poor ventilation. Perfusion: There is relative homogeneous distribution of injected radiotracer throughout the pulmonary  parenchyma, with slight preferential perfusion of the left lung in relation to the right. No discrete mismatched segmental or subsegmental filling defects to suggest pulmonary embolism. IMPRESSION: Pulmonary embolism absent (very low probability of pulmonary embolism). Electronically Signed   By: Sandi Mariscal M.D.   On: 11/08/2016 14:09   Dg Chest Port 1 View  Result Date: 11/09/2016 CLINICAL DATA:  Shortness of breath and pulmonary edema. EXAM: PORTABLE CHEST 1 VIEW COMPARISON:  Chest radiograph November 08, 2016 FINDINGS: Cardiac silhouette is similarly enlarged. Mediastinal silhouette is nonsuspicious. RIGHT mid and LEFT lower lung zone consolidation. Mild lucency LEFT  upper lobe consist with stent with bullous changes in COPD. Elevated RIGHT hemidiaphragm. No pleural effusion. No pneumothorax. Soft tissue planes and included osseous structures are unchanged. IMPRESSION: RIGHT mid and LEFT lower lung zone consolidation concerning for pneumonia. Followup PA and lateral chest X-ray is recommended in 3-4 weeks following trial of antibiotic therapy to ensure resolution and exclude underlying malignancy. Stable cardiomegaly. Electronically Signed   By: Elon Alas M.D.   On: 11/09/2016 03:55   Dg Chest Portable 1 View  Result Date: 11/08/2016 CLINICAL DATA:  Vomiting blood and bile. History of aspiration pneumonia. EXAM: PORTABLE CHEST 1 VIEW COMPARISON:  June 10, 2015 FINDINGS: There is new infiltrate in the right mid and lower lung. Mild opacity in left base is similar in the interval. No pneumothorax. No other interval changes. IMPRESSION: 1. New infiltrate in the right mid and lower lung could represent pneumonia or aspiration. Aspiration should be considered given history. 2. Mild opacity in left base is similar in the interval, possibly scar or atelectasis. Electronically Signed   By: Dorise Bullion III M.D   On: 11/08/2016 08:38   Dg Swallowing Func-speech Pathology  Result Date:  11/09/2016 Objective Swallowing Evaluation: Type of Study: MBS-Modified Barium Swallow Study Patient Details Name: Mosiah Bastin MRN: [id number] Date of Birth: 1923/01/15 Today's Date: 11/09/2016 Time: SLP Start Time (ACUTE ONLY): 1030-SLP Stop Time (ACUTE ONLY): 1100 SLP Time Calculation (min) (ACUTE ONLY): 30 min Past Medical History: Past Medical History: Diagnosis Date . Aspiration pneumonia ([place])  . BPH (benign prostatic hyperplasia)  . CAD (coronary artery disease)   s/p stent . CKD (chronic kidney disease)  . COPD (chronic obstructive pulmonary disease) ([place])  . Depression  . GERD (gastroesophageal reflux disease)  . HLD (hyperlipidemia)  . PE (pulmonary embolism)  Past Surgical History: Past Surgical History: Procedure Laterality Date . APPENDECTOMY   . NEPHRECTOMY Right  HPI: Mr. O[place]aca is a 83 yo M with a past medical history of CAD, CKD IV (hx of nephrectomy x 70 yrs), pulmonary fibrosis, PE (on warfarin), adrenal insufficiency who presented to the ED with complaints of fever, hemoptysis/hematemesis. The details of the history were provided by his son who he currently lives with.   This morning the patient woke up and felt cold with chills and was generally weak with increased work of breathing. His son took his temperature with a thermometer which was 101. He began coughing and spitting up bright red blood. He also had foul-smelling vomit, also with blood. The patient was in his usual state of health yesterday, last night noted to feel queasy and did not go out to dinner with family. No other recent complaints of increased cough, nausea vomiting or diarrhea, or other symptoms. Of note, he was admitted at an outside hospital on 3/30 for pneumonia. His son states he also had hemoptysis, fever at that time. His family denies any overt signs of aspiration in the patient.  Subjective: The patient was seen in radiology for MBS to determine current swallowing physiology and least restrictive diet.  Assessment /  Plan / Recommendation CHL IP CLINICAL IMPRESSIONS 11/09/2016 Clinical Impression MBS was completed using thin liquids, nectar thick liquids, pureed material and dual textured solids.  The patient was noted to have a mild oropharyngeal dysphagia.  The oral phase was characterized by delayed oral transit given dual textured solids.  The pharyngeal phase was characterizced by a delayed swallow trigger with thin and nectar thick liquids triggering at the pyriform sinuses and pureed material and  dual textured solids triggering at the vallecular.  Of note, following the initial strip the patient was noted to clear the pharyngeal space of all material but backflow from the esophagus was noted to the level of the pyriform sinuses in some cases leading to severe residue.  Visualization of the CP segment was difficult due to the patient's position and broad shoulders.  Despite attempts to improve view the cricopharyngeal segment was never able to be fully visualized.  He did not appear to have a [place] Diverticulum.  It is unclear if the patient's cricopharngeal segment is tight or possibly closing prematurely.  Further assessment may be warranted as the patient is at very high risk for aspiration of the material that backflows to the pyriform sinuses.  Penetration to the cords was noted prior to the swallow given tsp sips of thin and nectar thick liquids.  Use of a chin tuck eliminated this.  Esophageal did not reveal other overt issues.  Recommend a regular diet with thin liquids.  The patient MUST TUCK CHIN for all liquid swallows.  He should also sit up for 30-60 mins after meals to assist with clearance of the pyriform residue.  Suggest possible GI work up to fully assess esophageal function.  ST will follow up for therapeutic diet tolerance and education.  Patient may benefit from ST at next level of care.   SLP Visit Diagnosis Dysphagia, oropharyngeal phase (R13.12) Attention and concentration deficit following --  Frontal lobe and executive function deficit following -- Impact on safety and function Moderate aspiration risk   CHL IP TREATMENT RECOMMENDATION 11/09/2016 Treatment Recommendations Therapy as outlined in treatment plan below   Prognosis 11/09/2016 Prognosis for Safe Diet Advancement Good Barriers to Reach Goals -- Barriers/Prognosis Comment -- CHL IP DIET RECOMMENDATION 11/09/2016 SLP Diet Recommendations Regular solids;Thin liquid Liquid Administration via Cup;No straw Medication Administration Crushed with puree Compensations Minimize environmental distractions;Slow rate;Small sips/bites;Chin tuck Postural Changes Seated upright at 90 degrees;Remain semi-upright after after feeds/meals (Comment)   CHL IP OTHER RECOMMENDATIONS 11/09/2016 Recommended Consults -- Oral Care Recommendations Oral care QID;Oral care before and after PO Other Recommendations --   CHL IP FOLLOW UP RECOMMENDATIONS 11/09/2016 Follow up Recommendations Home health SLP   CHL IP FREQUENCY AND DURATION 11/09/2016 Speech Therapy Frequency (ACUTE ONLY) min 2x/week Treatment Duration 2 weeks      CHL IP ORAL PHASE 11/09/2016 Oral Phase Impaired Oral - Pudding Teaspoon -- Oral - Pudding Cup -- Oral - Honey Teaspoon -- Oral - Honey Cup -- Oral - Nectar Teaspoon -- Oral - Nectar Cup -- Oral - Nectar Straw -- Oral - Thin Teaspoon -- Oral - Thin Cup -- Oral - Thin Straw -- Oral - Puree -- Oral - Mech Soft -- Oral - Regular -- Oral - Multi-Consistency Delayed oral transit Oral - Pill -- Oral Phase - Comment --  CHL IP PHARYNGEAL PHASE 11/09/2016 Pharyngeal Phase Impaired Pharyngeal- Pudding Teaspoon -- Pharyngeal -- Pharyngeal- Pudding Cup -- Pharyngeal -- Pharyngeal- Honey Teaspoon -- Pharyngeal -- Pharyngeal- Honey Cup -- Pharyngeal -- Pharyngeal- Nectar Teaspoon Delayed swallow initiation-pyriform sinuses;Pharyngeal residue - pyriform Pharyngeal -- Pharyngeal- Nectar Cup Delayed swallow initiation-pyriform sinuses;Pharyngeal residue - pyriform Pharyngeal --  Pharyngeal- Nectar Straw -- Pharyngeal -- Pharyngeal- Thin Teaspoon Pharyngeal residue - pyriform Pharyngeal -- Pharyngeal- Thin Cup -- Pharyngeal -- Pharyngeal- Thin Straw -- Pharyngeal -- Pharyngeal- Puree Pharyngeal residue - pyriform Pharyngeal -- Pharyngeal- Mechanical Soft -- Pharyngeal -- Pharyngeal- Regular -- Pharyngeal -- Pharyngeal- Multi-consistency Pharyngeal residue - pyriform Pharyngeal --  Pharyngeal- Pill -- Pharyngeal -- Pharyngeal Comment --  CHL IP CERVICAL ESOPHAGEAL PHASE 11/09/2016 Cervical Esophageal Phase Impaired Pudding Teaspoon -- Pudding Cup -- Honey Teaspoon -- Honey Cup -- Nectar Teaspoon -- Nectar Cup -- Nectar Straw -- Thin Teaspoon -- Thin Cup -- Thin Straw -- Puree Esophageal backflow into the pharynx Mechanical Soft -- Regular -- Multi-consistency -- Pill -- Cervical Esophageal Comment Patient with some type of CP issues that were not well visualized.  Back flow from the CP segment not opening enough or closing too early most likely.   Shelly Flatten, MA, CCC-SLP Acute Rehab SLP [phone] [place] Spr[place] 11/09/2016, 11:16 AM                Not all labs, radiology exams or other studies done during hospitalization come through on my EPIC note; however they are reviewed by me.    Assessment and Plan  ASPIRATION PNEUMONIA/SEPSIS-classic pneumonia presentation with sepsis requiring IV fluid resuscitation; had been treated prior in March with Ceftin and doxycycline; given location of pneumonia aspiration was a possibility so patient was started on IV Unasyn after receiving Rocephin and Zithromax in the emergency department patient was transitioned from Unasyn to Augmentin for a 10 day course; patient is on 2 L nasal cannula on discharge SNF - will wean off O2 as able; continue Augmentin 500 mg twice a day ending on September 18.  ORAL pharyngeal DYSPHASIA-failed swallow test, recommended regular diet with nectar thick liquids SNF - will be seen by speech therapy and skilled  nursing  ACUTE ON [place] IV-patient is status post right nephrectomy, a lower 2 injury, baseline creatinine around 2; creatinine discharge 2.26 SNF -will follow-up BMP  ADRENAL INSUFFICIENCY-patient received 2 doses of Solu-Medrol, while his home steroids were also continued SNF - will continue home doses of Florinef 0.1 mg daily and hydrocortisone 10 mg tablet every morning and 5 mg daily at bedtime  HISTORY OF PE-remote history of pulmonary embolisms, details unclear as patient recently moved from [place] but he knows he is on chronic Coumadin therapy; was held due to hemoptysis cleared up he was started on his prior regimen of 4 mg on Monday and Friday and 2 mg on every other day after a loading dose of 5 mg at the hospital SNF - will plan on starting patient on his usual dose of 4 mg on Monday and Friday and 2 mg every other day; will be titrating warfarin actively  HYPERTENSION [facility] - continue Norvasc 2.5 mg daily, Lasix 20 mg daily when necessary, Imdur 60 mg daily, and Toprol-XL 25 mg by mouth daily  HYPOTHYROIDISM [facility] - not stated as uncontrolled; continue Synthroid 25 g by mouth daily   Time spent greater than 45 minutes;> 50% of time with patient was spent reviewing records, labs, tests and studies, counseling and developing plan of care  Webb Silversmith D. Jimmye Norman MD

## 2016-11-16 DIAGNOSIS — N184 Chronic kidney disease, stage 4 (severe): Secondary | ICD-10-CM

## 2016-11-16 DIAGNOSIS — R4702 Dysphasia: Secondary | ICD-10-CM | POA: Insufficient documentation

## 2016-11-16 DIAGNOSIS — E273 Drug-induced adrenocortical insufficiency: Secondary | ICD-10-CM | POA: Insufficient documentation

## 2016-11-16 DIAGNOSIS — N179 Acute kidney failure, unspecified: Secondary | ICD-10-CM | POA: Insufficient documentation

## 2016-11-16 DIAGNOSIS — T380X5A Adverse effect of glucocorticoids and synthetic analogues, initial encounter: Secondary | ICD-10-CM | POA: Insufficient documentation

## 2016-11-16 DIAGNOSIS — I10 Essential (primary) hypertension: Secondary | ICD-10-CM | POA: Insufficient documentation

## 2016-11-16 HISTORY — DX: Acute kidney failure, unspecified: N18.4

## 2016-11-16 HISTORY — DX: Acute kidney failure, unspecified: N17.9

## 2016-11-17 ENCOUNTER — Encounter: Payer: Self-pay | Admitting: Nurse Practitioner

## 2016-11-17 LAB — CBC AND DIFFERENTIAL
HEMOGLOBIN: 10.6 — AB (ref 13.5–17.5)
PLATELETS: 302 (ref 150–399)
WBC: 10

## 2016-11-17 LAB — BASIC METABOLIC PANEL
BUN: 31 — AB (ref 4–21)
CREATININE: 2.3 — AB (ref 0.6–1.3)
GLUCOSE: 96
Potassium: 3.5 (ref 3.4–5.3)
Sodium: 144 (ref 137–147)

## 2016-11-20 ENCOUNTER — Inpatient Hospital Stay (HOSPITAL_COMMUNITY)
Admission: EM | Admit: 2016-11-20 | Discharge: 2016-11-23 | DRG: 378 | Disposition: A | Discharge status: Skilled Nursing Facility | Payer: Medicare Other | Attending: Internal Medicine | Admitting: Internal Medicine

## 2016-11-20 ENCOUNTER — Inpatient Hospital Stay (HOSPITAL_COMMUNITY): Payer: Medicare Other

## 2016-11-20 ENCOUNTER — Encounter (HOSPITAL_COMMUNITY): Payer: Self-pay | Admitting: Emergency Medicine

## 2016-11-20 DIAGNOSIS — I2510 Atherosclerotic heart disease of native coronary artery without angina pectoris: Secondary | ICD-10-CM | POA: Diagnosis present

## 2016-11-20 DIAGNOSIS — Z955 Presence of coronary angioplasty implant and graft: Secondary | ICD-10-CM

## 2016-11-20 DIAGNOSIS — I951 Orthostatic hypotension: Secondary | ICD-10-CM | POA: Diagnosis present

## 2016-11-20 DIAGNOSIS — E669 Obesity, unspecified: Secondary | ICD-10-CM | POA: Diagnosis present

## 2016-11-20 DIAGNOSIS — R791 Abnormal coagulation profile: Secondary | ICD-10-CM | POA: Diagnosis not present

## 2016-11-20 DIAGNOSIS — Z8639 Personal history of other endocrine, nutritional and metabolic disease: Secondary | ICD-10-CM | POA: Diagnosis not present

## 2016-11-20 DIAGNOSIS — R1312 Dysphagia, oropharyngeal phase: Secondary | ICD-10-CM | POA: Diagnosis present

## 2016-11-20 DIAGNOSIS — K5731 Diverticulosis of large intestine without perforation or abscess with bleeding: Secondary | ICD-10-CM | POA: Diagnosis present

## 2016-11-20 DIAGNOSIS — N400 Enlarged prostate without lower urinary tract symptoms: Secondary | ICD-10-CM | POA: Diagnosis present

## 2016-11-20 DIAGNOSIS — Z79899 Other long term (current) drug therapy: Secondary | ICD-10-CM

## 2016-11-20 DIAGNOSIS — Z86711 Personal history of pulmonary embolism: Secondary | ICD-10-CM | POA: Diagnosis not present

## 2016-11-20 DIAGNOSIS — J8410 Pulmonary fibrosis, unspecified: Secondary | ICD-10-CM | POA: Diagnosis present

## 2016-11-20 DIAGNOSIS — Z888 Allergy status to other drugs, medicaments and biological substances status: Secondary | ICD-10-CM

## 2016-11-20 DIAGNOSIS — Z6829 Body mass index (BMI) 29.0-29.9, adult: Secondary | ICD-10-CM | POA: Diagnosis not present

## 2016-11-20 DIAGNOSIS — J449 Chronic obstructive pulmonary disease, unspecified: Secondary | ICD-10-CM | POA: Diagnosis present

## 2016-11-20 DIAGNOSIS — N183 Chronic kidney disease, stage 3 (moderate): Secondary | ICD-10-CM | POA: Diagnosis present

## 2016-11-20 DIAGNOSIS — K219 Gastro-esophageal reflux disease without esophagitis: Secondary | ICD-10-CM | POA: Diagnosis not present

## 2016-11-20 DIAGNOSIS — D62 Acute posthemorrhagic anemia: Secondary | ICD-10-CM

## 2016-11-20 DIAGNOSIS — E876 Hypokalemia: Secondary | ICD-10-CM | POA: Diagnosis present

## 2016-11-20 DIAGNOSIS — K922 Gastrointestinal hemorrhage, unspecified: Secondary | ICD-10-CM

## 2016-11-20 DIAGNOSIS — I714 Abdominal aortic aneurysm, without rupture: Secondary | ICD-10-CM | POA: Diagnosis present

## 2016-11-20 DIAGNOSIS — E039 Hypothyroidism, unspecified: Secondary | ICD-10-CM | POA: Diagnosis present

## 2016-11-20 DIAGNOSIS — Z905 Acquired absence of kidney: Secondary | ICD-10-CM | POA: Diagnosis not present

## 2016-11-20 DIAGNOSIS — K5733 Diverticulitis of large intestine without perforation or abscess with bleeding: Secondary | ICD-10-CM

## 2016-11-20 DIAGNOSIS — I10 Essential (primary) hypertension: Secondary | ICD-10-CM | POA: Diagnosis present

## 2016-11-20 DIAGNOSIS — K5791 Diverticulosis of intestine, part unspecified, without perforation or abscess with bleeding: Secondary | ICD-10-CM | POA: Diagnosis not present

## 2016-11-20 DIAGNOSIS — Z7901 Long term (current) use of anticoagulants: Secondary | ICD-10-CM | POA: Diagnosis not present

## 2016-11-20 DIAGNOSIS — Z9229 Personal history of other drug therapy: Secondary | ICD-10-CM

## 2016-11-20 DIAGNOSIS — D684 Acquired coagulation factor deficiency: Secondary | ICD-10-CM | POA: Diagnosis present

## 2016-11-20 DIAGNOSIS — E2740 Unspecified adrenocortical insufficiency: Secondary | ICD-10-CM | POA: Diagnosis present

## 2016-11-20 DIAGNOSIS — I129 Hypertensive chronic kidney disease with stage 1 through stage 4 chronic kidney disease, or unspecified chronic kidney disease: Secondary | ICD-10-CM | POA: Diagnosis present

## 2016-11-20 DIAGNOSIS — I2699 Other pulmonary embolism without acute cor pulmonale: Secondary | ICD-10-CM | POA: Diagnosis not present

## 2016-11-20 DIAGNOSIS — R933 Abnormal findings on diagnostic imaging of other parts of digestive tract: Secondary | ICD-10-CM | POA: Diagnosis not present

## 2016-11-20 DIAGNOSIS — E785 Hyperlipidemia, unspecified: Secondary | ICD-10-CM | POA: Diagnosis present

## 2016-11-20 DIAGNOSIS — K921 Melena: Secondary | ICD-10-CM | POA: Diagnosis not present

## 2016-11-20 DIAGNOSIS — Z7952 Long term (current) use of systemic steroids: Secondary | ICD-10-CM

## 2016-11-20 DIAGNOSIS — F329 Major depressive disorder, single episode, unspecified: Secondary | ICD-10-CM | POA: Diagnosis present

## 2016-11-20 DIAGNOSIS — D689 Coagulation defect, unspecified: Secondary | ICD-10-CM

## 2016-11-20 HISTORY — DX: Gastrointestinal hemorrhage, unspecified: K92.2

## 2016-11-20 LAB — PROTIME-INR
INR: 4.18
INR: 1.68
PROTHROMBIN TIME: 40 s — AB (ref 11.4–15.2)
Prothrombin Time: 19.6 seconds — ABNORMAL HIGH (ref 11.4–15.2)

## 2016-11-20 LAB — CBC
HCT: 28.2 % — ABNORMAL LOW (ref 39.0–52.0)
HCT: 25.8 % — ABNORMAL LOW (ref 39.0–52.0)
Hemoglobin: 9.6 g/dL — ABNORMAL LOW (ref 13.0–17.0)
Hemoglobin: 8.6 g/dL — ABNORMAL LOW (ref 13.0–17.0)
MCH: 30.7 pg (ref 26.0–34.0)
MCH: 30.7 pg (ref 26.0–34.0)
MCHC: 34 g/dL (ref 30.0–36.0)
MCHC: 33.3 g/dL (ref 30.0–36.0)
MCV: 90.1 fL (ref 78.0–100.0)
MCV: 92.1 fL (ref 78.0–100.0)
PLATELETS: 347 10*3/uL (ref 150–400)
PLATELETS: 344 10*3/uL (ref 150–400)
RBC: 3.13 MIL/uL — AB (ref 4.22–5.81)
RBC: 2.8 MIL/uL — AB (ref 4.22–5.81)
RDW: 15.8 % — ABNORMAL HIGH (ref 11.5–15.5)
RDW: 16.1 % — ABNORMAL HIGH (ref 11.5–15.5)
WBC: 10.5 10*3/uL (ref 4.0–10.5)
WBC: 17.1 10*3/uL — ABNORMAL HIGH (ref 4.0–10.5)

## 2016-11-20 LAB — COMPREHENSIVE METABOLIC PANEL
ALBUMIN: 2.5 g/dL — AB (ref 3.5–5.0)
ALT: 13 U/L — AB (ref 17–63)
AST: 19 U/L (ref 15–41)
Alkaline Phosphatase: 47 U/L (ref 38–126)
Anion gap: 10 (ref 5–15)
BUN: 25 mg/dL — AB (ref 6–20)
CHLORIDE: 110 mmol/L (ref 101–111)
CO2: 23 mmol/L (ref 22–32)
CREATININE: 1.84 mg/dL — AB (ref 0.61–1.24)
Calcium: 8.3 mg/dL — ABNORMAL LOW (ref 8.9–10.3)
GFR calc non Af Amer: 30 mL/min — ABNORMAL LOW (ref >60)
GFR, EST AFRICAN AMERICAN: 35 mL/min — AB (ref >60)
GLUCOSE: 82 mg/dL (ref 65–99)
Potassium: 3.2 mmol/L — ABNORMAL LOW (ref 3.5–5.1)
SODIUM: 143 mmol/L (ref 135–145)
Total Bilirubin: 0.6 mg/dL (ref 0.3–1.2)
Total Protein: 5.2 g/dL — ABNORMAL LOW (ref 6.5–8.1)

## 2016-11-20 LAB — PREPARE RBC (CROSSMATCH)

## 2016-11-20 LAB — MRSA PCR SCREENING: MRSA by PCR: NEGATIVE

## 2016-11-20 LAB — ABO/RH: ABO/RH(D): A POS

## 2016-11-20 MED ORDER — FINASTERIDE 5 MG PO TABS
5.0000 mg | ORAL_TABLET | Freq: Every day | ORAL | Status: DC
  Administered 2016-11-20 – 2016-11-23 (×4): 5 mg via ORAL
  Filled 2016-11-20 (×5): qty 1

## 2016-11-20 MED ORDER — ACETAMINOPHEN 325 MG PO TABS
650.0000 mg | ORAL_TABLET | Freq: Four times a day (QID) | ORAL | Status: DC | PRN
  Filled 2016-11-20: qty 2

## 2016-11-20 MED ORDER — METOPROLOL SUCCINATE ER 25 MG PO TB24
25.0000 mg | ORAL_TABLET | Freq: Every day | ORAL | Status: DC
  Administered 2016-11-20 – 2016-11-23 (×4): 25 mg via ORAL
  Filled 2016-11-20 (×4): qty 1

## 2016-11-20 MED ORDER — ONDANSETRON HCL 4 MG PO TABS: 4.0000 mg | ORAL_TABLET | Freq: Four times a day (QID) | ORAL | Status: DC | PRN

## 2016-11-20 MED ORDER — HYDROCORTISONE 10 MG PO TABS
10.0000 mg | ORAL_TABLET | Freq: Every morning | ORAL | Status: DC
  Administered 2016-11-21 – 2016-11-23 (×3): 10 mg via ORAL
  Filled 2016-11-20 (×3): qty 1

## 2016-11-20 MED ORDER — LEVALBUTEROL HCL 0.63 MG/3ML IN NEBU: 0.6300 mg | INHALATION_SOLUTION | Freq: Four times a day (QID) | RESPIRATORY_TRACT | Status: DC | PRN

## 2016-11-20 MED ORDER — SODIUM CHLORIDE 0.9 % IV BOLUS (SEPSIS)
500.0000 mL | Freq: Once | INTRAVENOUS | Status: AC
  Administered 2016-11-20: 500 mL via INTRAVENOUS

## 2016-11-20 MED ORDER — SERTRALINE HCL 50 MG PO TABS
50.0000 mg | ORAL_TABLET | Freq: Every day | ORAL | Status: DC
  Administered 2016-11-20 – 2016-11-23 (×4): 50 mg via ORAL
  Filled 2016-11-20 (×4): qty 1

## 2016-11-20 MED ORDER — ACETAMINOPHEN 650 MG RE SUPP: 650.0000 mg | Freq: Four times a day (QID) | RECTAL | Status: DC | PRN

## 2016-11-20 MED ORDER — ACETAMINOPHEN 325 MG PO TABS: 650.0000 mg | ORAL_TABLET | Freq: Four times a day (QID) | ORAL | Status: DC | PRN

## 2016-11-20 MED ORDER — HYDROCORTISONE 10 MG PO TABS: 10.0000 mg | ORAL_TABLET | Freq: Two times a day (BID) | ORAL | Status: DC

## 2016-11-20 MED ORDER — ONDANSETRON HCL 4 MG/2ML IJ SOLN: 4.0000 mg | Freq: Four times a day (QID) | INTRAMUSCULAR | Status: DC | PRN

## 2016-11-20 MED ORDER — ISOSORBIDE MONONITRATE ER 60 MG PO TB24
60.0000 mg | ORAL_TABLET | Freq: Every day | ORAL | Status: DC
  Administered 2016-11-20 – 2016-11-23 (×4): 60 mg via ORAL
  Filled 2016-11-20 (×4): qty 1

## 2016-11-20 MED ORDER — FLUDROCORTISONE ACETATE 0.1 MG PO TABS
0.1000 mg | ORAL_TABLET | Freq: Every day | ORAL | Status: DC
  Administered 2016-11-20 – 2016-11-23 (×4): 0.1 mg via ORAL
  Filled 2016-11-20 (×5): qty 1

## 2016-11-20 MED ORDER — SODIUM CHLORIDE 0.9 % IV SOLN: Freq: Once | INTRAVENOUS | Status: DC

## 2016-11-20 MED ORDER — TECHNETIUM TC 99M-LABELED RED BLOOD CELLS IV KIT
27.4000 | PACK | Freq: Once | INTRAVENOUS | Status: AC | PRN
  Administered 2016-11-20: 27.4 via INTRAVENOUS

## 2016-11-20 MED ORDER — VITAMIN K1 10 MG/ML IJ SOLN
10.0000 mg | Freq: Once | INTRAVENOUS | Status: AC
  Administered 2016-11-20: 10 mg via INTRAVENOUS
  Filled 2016-11-20: qty 1

## 2016-11-20 MED ORDER — LEVOTHYROXINE SODIUM 25 MCG PO TABS
25.0000 ug | ORAL_TABLET | Freq: Every day | ORAL | Status: DC
  Administered 2016-11-20 – 2016-11-23 (×3): 25 ug via ORAL
  Filled 2016-11-20 (×4): qty 1

## 2016-11-20 MED ORDER — HYDROCORTISONE ACETATE 25 MG RE SUPP
25.0000 mg | Freq: Three times a day (TID) | RECTAL | Status: DC
  Filled 2016-11-20: qty 1

## 2016-11-20 MED ORDER — SODIUM CHLORIDE 0.9 % IV SOLN
Freq: Once | INTRAVENOUS | Status: AC
  Administered 2016-11-21: 01:00:00 via INTRAVENOUS

## 2016-11-20 MED ORDER — HYDROCORTISONE ACETATE 25 MG RE SUPP
25.0000 mg | Freq: Three times a day (TID) | RECTAL | Status: DC | PRN
  Filled 2016-11-20: qty 1

## 2016-11-20 MED ORDER — PRAVASTATIN SODIUM 20 MG PO TABS
20.0000 mg | ORAL_TABLET | Freq: Every day | ORAL | Status: DC
  Administered 2016-11-20 – 2016-11-23 (×4): 20 mg via ORAL
  Filled 2016-11-20 (×4): qty 1

## 2016-11-20 MED ORDER — FENOFIBRATE 160 MG PO TABS
160.0000 mg | ORAL_TABLET | Freq: Every day | ORAL | Status: DC
  Administered 2016-11-20 – 2016-11-23 (×4): 160 mg via ORAL
  Filled 2016-11-20 (×5): qty 1

## 2016-11-20 MED ORDER — PANTOPRAZOLE SODIUM 40 MG PO TBEC
40.0000 mg | DELAYED_RELEASE_TABLET | Freq: Every day | ORAL | Status: DC
  Administered 2016-11-20 – 2016-11-23 (×4): 40 mg via ORAL
  Filled 2016-11-20 (×4): qty 1

## 2016-11-20 MED ORDER — COLESEVELAM HCL 625 MG PO TABS
1875.0000 mg | ORAL_TABLET | Freq: Two times a day (BID) | ORAL | Status: DC
  Administered 2016-11-21 – 2016-11-23 (×6): 1875 mg via ORAL
  Filled 2016-11-20 (×6): qty 3

## 2016-11-20 MED ORDER — HYDROCORTISONE 5 MG PO TABS
5.0000 mg | ORAL_TABLET | Freq: Every evening | ORAL | Status: DC
  Administered 2016-11-20 – 2016-11-23 (×4): 5 mg via ORAL
  Filled 2016-11-20 (×5): qty 1

## 2016-11-20 NOTE — ED Triage Notes (Signed)
Pt comes from [facility], noticed bright red blood for 2 days consistent  in rectal area. Pt is on blood thinner. V/s on arrival hr 52, sinus brady, 98 spo2, rr18, bp 152/106,  Alert x 4 iv right hand 20

## 2016-11-20 NOTE — Consult Note (Signed)
Chief Complaint: Patient was seen in consultation today for  Chief Complaint  Patient presents with  . Rectal Bleeding   at the request of Dr. Allyson Sabal  Referring Physician(s): Reyne Dumas  Patient Status: [facility] - In-pt  History of Present Illness: Leonard Shepard is a 83 y.o. male with multiple medical problems including restrictive and obstructive lung disease, coronary artery disease status post stent placement, chronic kidney disease stage 3, history of pulmonary embolism requiring chronic Coumadin, adrenal insufficiency and a solitary left kidney. Patient was recently in the hospital for aspiration pneumonia. Patient is now in the hospital with bright red blood per rectum with tenesmus. Patient's INR was supratherapeutic earlier today at 4.18. Patient has been administered vitamin K and will soon be getting FFP and blood. Patient had a nuclear medicine tagged red blood cell study this evening. The nuclear medicine study was positive for bleeding and the source is thought to be the sigmoid colon. Interventional radiology was consulted about potential angiography and embolization. The patient is currently in the stepdown unit. Patient is awake and talking. His main complaint is weakness and irritation with the constant bowel movements. Patient denies any respiratory problems. No significant abdominal pain. He does complain of some swelling in his hands and feet.  Past Medical History:  Diagnosis Date  . Aspiration pneumonia ([place])   . BPH (benign prostatic hyperplasia)   . CAD (coronary artery disease)    s/p stent  . Chronic renal insufficiency, stage III (moderate)   . CKD (chronic kidney disease)   . COPD (chronic obstructive pulmonary disease) ([place])   . Depression   . GERD (gastroesophageal reflux disease)   . H/O unilateral nephrectomy   . HLD (hyperlipidemia)   . Oropharyngeal dysphagia 11/10/2016  . PE (pulmonary embolism)   . Postinflammatory pulmonary fibrosis ([facility])      Past Surgical History:  Procedure Laterality Date  . APPENDECTOMY    . NEPHRECTOMY Right     Allergies: Aclidinium bromide  Medications: Prior to Admission medications   Medication Sig Start Date End Date Taking? Authorizing Provider  amLODipine (NORVASC) 2.5 MG tablet Take 2.5 mg by mouth daily.   Yes [provider]  colesevelam (WELCHOL) 625 MG tablet Take 1,875 mg by mouth 2 (two) times daily with a meal.    Yes [provider]  diphenhydramine-acetaminophen (TYLENOL PM) 25-500 MG TABS tablet Take 2 tablets by mouth at bedtime.   Yes [provider]  fenofibrate 160 MG tablet Take 160 mg by mouth daily.   Yes [provider]  finasteride (PROSCAR) 5 MG tablet Take 5 mg by mouth daily.   Yes [provider]  fludrocortisone (FLORINEF) 0.1 MG tablet Take 0.1 mg by mouth daily.   Yes [provider]  fluticasone (FLONASE) 50 MCG/ACT nasal spray Place 1 spray into both nostrils daily.   Yes [provider]  furosemide (LASIX) 20 MG tablet Take 20 mg by mouth daily as needed for fluid.   Yes [provider]  guaifenesin (ROBITUSSIN) 100 MG/5ML syrup Take 200 mg by mouth every 6 (six) hours as needed for cough.   Yes [provider]  hydrocortisone (ANUSOL-HC) 25 MG suppository Place 25 mg rectally 3 (three) times daily. For 2 weeks   Yes [provider]  hydrocortisone (CORTEF) 10 MG tablet Take 1 tablet in the morning and half a tablet at night. 06/12/15  Yes Charlynne Cousins, MD  isosorbide mononitrate (IMDUR) 60 MG 24 hr tablet Take 60  mg by mouth daily.   Yes [provider]  levothyroxine (SYNTHROID, LEVOTHROID) 25 MCG tablet Take 25 mcg by mouth daily before breakfast.   Yes [provider]  Magnesium Cl-Calcium Carbonate (SLOW-MAG PO) Take 1 tablet by mouth daily.   Yes [provider]  metoprolol succinate (TOPROL-XL) 25 MG 24 hr tablet Take 25 mg by mouth  daily.   Yes [provider]  ondansetron (ZOFRAN) 4 MG tablet Take 4 mg by mouth every 8 (eight) hours as needed for nausea or vomiting.   Yes [provider]  pantoprazole (PROTONIX) 40 MG tablet Take 40 mg by mouth daily.   Yes [provider]  pravastatin (PRAVACHOL) 20 MG tablet Take 20 mg by mouth daily.   Yes [provider]  sertraline (ZOLOFT) 50 MG tablet Take 50 mg by mouth daily.   Yes [provider]  warfarin (COUMADIN) 4 MG tablet Take 2-4 mg by mouth See admin instructions. Take 4 mg by mouth daily on Monday and Friday. Take 2 mg by mouth daily on all other days   Yes [provider]     Family History  Problem Relation Age of Onset  . Lung disease Father   . Leukemia Brother     Social History   Social History  . Marital status: Widowed    Spouse name: N/A  . Number of children: N/A  . Years of education: N/A   Social History Main Topics  . Smoking status: Never Smoker  . Smokeless tobacco: Never Used  . Alcohol use No  . Drug use: No  . Sexual activity: Not Currently   Other Topics Concern  . None   Social History Narrative   Admitted to [facility] 11/12/16   Widowed   Never smoked   Alcohol none   Full Code     Review of Systems  Constitutional: Positive for fatigue.  Respiratory: Negative for shortness of breath.   Cardiovascular: Positive for leg swelling. Negative for chest pain.  Gastrointestinal: Positive for blood in stool.  Neurological: Positive for weakness.    Vital Signs: BP (!) 119/55   Pulse 85   Temp 97.9 F (36.6 C) (Oral)   Resp 19   SpO2 97%   Physical Exam  Constitutional: He is oriented to person, place, and time. No distress.  HENT:  Mouth/Throat: Oropharynx is clear and moist.  Abdominal: Soft. He exhibits no distension. There is tenderness. There is no rebound.  Tenderness in the right lateral abdomen.  Musculoskeletal: He exhibits edema.  Mild  pitting edema in both ankles. Difficult to palpate pedal pulses. Strong palpable groin pulses bilaterally.  Neurological: He is alert and oriented to person, place, and time.    Imaging: Ct Abdomen Pelvis Wo Contrast  Result Date: 11/08/2016 CLINICAL DATA:  Patient with vomiting.  Back pain. EXAM: CT ABDOMEN AND PELVIS WITHOUT CONTRAST TECHNIQUE: Multidetector CT imaging of the abdomen and pelvis was performed following the standard protocol without IV contrast. COMPARISON:  CT abdomen pelvis 06/11/2015 FINDINGS: Lower chest: Heart is mildly enlarged. Coronary arterial vascular calcifications. Interval progression of subpleural reticular opacities with associated architectural distortion. No pleural effusion. Hepatobiliary: Liver is normal in size and contour. Status post cholecystectomy. Pancreas: Unremarkable. Spleen: Unremarkable Adrenals/Urinary Tract: The adrenal glands are normal. Right kidney is surgically absent. Stable appearance of the surgical bed. Stable appearance of the left kidney. No hydronephrosis. Mild urinary bladder wall thickening. Stomach/Bowel: Large amount of stool within the  rectum. Descending and sigmoid colonic diverticulosis. No CT evidence for acute diverticulitis. Vascular/Lymphatic: Peripheral calcified atherosclerotic plaque involving the abdominal aorta. Infrarenal abdominal aortic ectasia measuring 3.8 cm, previously 3.5 cm. No retroperitoneal lymphadenopathy. Reproductive: Prostate unremarkable. Other: None. Musculoskeletal: Thoracic and lumbar spine degenerative changes. No aggressive or acute appearing osseous lesions. IMPRESSION: Large amount of stool within the rectum compatible with constipation. Descending and sigmoid colonic diverticulosis without CT evidence for acute diverticulitis. Infrarenal abdominal aortic aneurysm, slightly increased from prior measuring 3.8 cm. Recommend followup by ultrasound in 2 years. This recommendation follows ACR consensus guidelines:  White Paper of the ACR Incidental Findings Committee II on Vascular Findings. J Am Coll Radiol 2013; 10:789-794. Interval progression of subpleural reticular opacities and architectural distortion most compatible with progressed pulmonary fibrotic change. In the nonacute setting, recommend further evaluation with high-resolution chest CT. Mild urinary bladder wall thickening. Recommend correlation with urinalysis as infection not excluded. Electronically Signed   By: Lovey Newcomer M.D.   On: 11/08/2016 13:09   Dg Chest 2 View  Result Date: 11/10/2016 CLINICAL DATA:  Increase shortness of breath, pneumonia, history of COPD, coronary artery disease with stent placement, previous pulmonary embolism. EXAM: CHEST  2 VIEW COMPARISON:  Portable chest x-ray of November 09, 2016 FINDINGS: The lungs are adequately inflated. The interstitial markings remain increased with areas of patchy confluence in the right mid lung and the left lung base. Pair radius small left pleural effusion. The cardiac silhouette remains enlarged. The central pulmonary vascularity is prominent. There is calcification in the wall of the thoracic aorta. The observed bony thorax exhibits no acute abnormality. IMPRESSION: Persistent increased lung markings as described worrisome for interstitial pneumonia. Underlying low-grade compensated CHF may be present. Overall there has not been significant interval change in the appearance of the chest since yesterday's study. Electronically Signed   By: David  Martinique M.D.   On: 11/10/2016 10:15   Nm Gi Blood Loss  Result Date: 11/20/2016 CLINICAL DATA:  GI bleed intermittent for 2 days. EXAM: NUCLEAR MEDICINE GASTROINTESTINAL BLEEDING SCAN TECHNIQUE: Sequential abdominal images were obtained following intravenous administration of Tc-44m labeled red blood cells. RADIOPHARMACEUTICALS:  27.4 mCi Tc-63m in-vitro labeled red cells. COMPARISON:  CT scan from 04/10/ 2017 FINDINGS: There is an area of  extravascular tracer accumulation in the lower abdomen/pelvis with some linear retrograde movement during the exam. This collection appears to clear between the first and second hours of imaging and the second hour of planar imaging shows further accumulation of radiotracer in the same region, but to a greater degree. Some portions of the tracer accumulation on the second hour do clear but there is no gross migration of the apparent extra vascular radiotracer. Movement of the radiotracer is more apparent on the stacked cine images. Reviewing coronal imaging from the previous CT scan, this tracer accumulation is in the region of the patient's sigmoid colon and has a similar configuration. IMPRESSION: Imaging features are highly suspicious for active bleeding in the sigmoid colon. Electronically Signed   By: Misty Stanley M.D.   On: 11/20/2016 18:23   US Renal  Result Date: 11/09/2016 CLINICAL DATA:  Patient with oliguria and an urea. Status post right renal removal. EXAM: RENAL / URINARY TRACT ULTRASOUND COMPLETE COMPARISON:  None. FINDINGS: Right Kidney: Surgically absent. Left Kidney: Length: 14.7 cm. Normal renal cortical thickness and echogenicity. Mild cortical lobulation. No hydronephrosis. Bladder: Decompressed with Foley catheter. IMPRESSION: No hydronephrosis. Electronically Signed   By: Lovey Newcomer M.D.   On: 11/09/2016  10:52   Nm Pulmonary Vent And Perf (v/q Scan)  Result Date: 11/08/2016 CLINICAL DATA:  Evaluate for pulmonary embolism EXAM: NUCLEAR MEDICINE VENTILATION - PERFUSION LUNG SCAN TECHNIQUE: Ventilation images were obtained in multiple projections using inhaled aerosol Tc-35m DTPA. Perfusion images were obtained in multiple projections after intravenous injection of Tc-70m MAA. RADIOPHARMACEUTICALS:  29.6 mCi Technetium-43m DTPA aerosol inhalation and 4.1 mCi Technetium-91m MAA IV COMPARISON:  Chest radiograph - earlier same day ; 06/10/2015 FINDINGS: Review of chest radiograph performed  earlier today demonstrates an enlarged cardiac silhouette and mediastinal contours. Interval development of ill-defined heterogeneous airspace opacities within the right mid and lower lung worrisome for areas of infection. Pulmonary vasculature appears indistinct with cephalization of flow. Small bilateral effusions are not excluded. Ventilation: There is clumping of inhaled radiotracer about the bilateral pulmonary hila, left greater than right, with overall poor ventilation. Perfusion: There is relative homogeneous distribution of injected radiotracer throughout the pulmonary parenchyma, with slight preferential perfusion of the left lung in relation to the right. No discrete mismatched segmental or subsegmental filling defects to suggest pulmonary embolism. IMPRESSION: Pulmonary embolism absent (very low probability of pulmonary embolism). Electronically Signed   By: Sandi Mariscal M.D.   On: 11/08/2016 14:09   Dg Chest Port 1 View  Result Date: 11/09/2016 CLINICAL DATA:  Shortness of breath and pulmonary edema. EXAM: PORTABLE CHEST 1 VIEW COMPARISON:  Chest radiograph November 08, 2016 FINDINGS: Cardiac silhouette is similarly enlarged. Mediastinal silhouette is nonsuspicious. RIGHT mid and LEFT lower lung zone consolidation. Mild lucency LEFT upper lobe consist with stent with bullous changes in COPD. Elevated RIGHT hemidiaphragm. No pleural effusion. No pneumothorax. Soft tissue planes and included osseous structures are unchanged. IMPRESSION: RIGHT mid and LEFT lower lung zone consolidation concerning for pneumonia. Followup PA and lateral chest X-ray is recommended in 3-4 weeks following trial of antibiotic therapy to ensure resolution and exclude underlying malignancy. Stable cardiomegaly. Electronically Signed   By: Elon Alas M.D.   On: 11/09/2016 03:55   Dg Chest Portable 1 View  Result Date: 11/08/2016 CLINICAL DATA:  Vomiting blood and bile. History of aspiration pneumonia. EXAM: PORTABLE  CHEST 1 VIEW COMPARISON:  June 10, 2015 FINDINGS: There is new infiltrate in the right mid and lower lung. Mild opacity in left base is similar in the interval. No pneumothorax. No other interval changes. IMPRESSION: 1. New infiltrate in the right mid and lower lung could represent pneumonia or aspiration. Aspiration should be considered given history. 2. Mild opacity in left base is similar in the interval, possibly scar or atelectasis. Electronically Signed   By: Dorise Bullion III M.D   On: 11/08/2016 08:38   Dg Swallowing Func-speech Pathology  Result Date: 11/09/2016 Objective Swallowing Evaluation: Type of Study: MBS-Modified Barium Swallow Study Patient Details Name: Dock Baccam MRN: [id number] Date of Birth: 06-13-1922 Today's Date: 11/09/2016 Time: SLP Start Time (ACUTE ONLY): 1030-SLP Stop Time (ACUTE ONLY): 1100 SLP Time Calculation (min) (ACUTE ONLY): 30 min Past Medical History: Past Medical History: Diagnosis Date . Aspiration pneumonia ([place])  . BPH (benign prostatic hyperplasia)  . CAD (coronary artery disease)   s/p stent . CKD (chronic kidney disease)  . COPD (chronic obstructive pulmonary disease) ([place])  . Depression  . GERD (gastroesophageal reflux disease)  . HLD (hyperlipidemia)  . PE (pulmonary embolism)  Past Surgical History: Past Surgical History: Procedure Laterality Date . APPENDECTOMY   . NEPHRECTOMY Right  HPI: Mr. Martine is a 83 yo M with a past medical history of  CAD, CKD IV (hx of nephrectomy x 70 yrs), pulmonary fibrosis, PE (on warfarin), adrenal insufficiency who presented to the ED with complaints of fever, hemoptysis/hematemesis. The details of the history were provided by his son who he currently lives with.   This morning the patient woke up and felt cold with chills and was generally weak with increased work of breathing. His son took his temperature with a thermometer which was 101. He began coughing and spitting up bright red blood. He also had foul-smelling vomit, also  with blood. The patient was in his usual state of health yesterday, last night noted to feel queasy and did not go out to dinner with family. No other recent complaints of increased cough, nausea vomiting or diarrhea, or other symptoms. Of note, he was admitted at an outside hospital on 3/30 for pneumonia. His son states he also had hemoptysis, fever at that time. His family denies any overt signs of aspiration in the patient.  Subjective: The patient was seen in radiology for MBS to determine current swallowing physiology and least restrictive diet.  Assessment / Plan / Recommendation CHL IP CLINICAL IMPRESSIONS 11/09/2016 Clinical Impression MBS was completed using thin liquids, nectar thick liquids, pureed material and dual textured solids.  The patient was noted to have a mild oropharyngeal dysphagia.  The oral phase was characterized by delayed oral transit given dual textured solids.  The pharyngeal phase was characterizced by a delayed swallow trigger with thin and nectar thick liquids triggering at the pyriform sinuses and pureed material and dual textured solids triggering at the vallecular.  Of note, following the initial strip the patient was noted to clear the pharyngeal space of all material but backflow from the esophagus was noted to the level of the pyriform sinuses in some cases leading to severe residue.  Visualization of the CP segment was difficult due to the patient's position and broad shoulders.  Despite attempts to improve view the cricopharyngeal segment was never able to be fully visualized.  He did not appear to have a Zenkers Diverticulum.  It is unclear if the patient's cricopharngeal segment is tight or possibly closing prematurely.  Further assessment may be warranted as the patient is at very high risk for aspiration of the material that backflows to the pyriform sinuses.  Penetration to the cords was noted prior to the swallow given tsp sips of thin and nectar thick liquids.  Use of a  chin tuck eliminated this.  Esophageal did not reveal other overt issues.  Recommend a regular diet with thin liquids.  The patient MUST TUCK CHIN for all liquid swallows.  He should also sit up for 30-60 mins after meals to assist with clearance of the pyriform residue.  Suggest possible GI work up to fully assess esophageal function.  ST will follow up for therapeutic diet tolerance and education.  Patient may benefit from ST at next level of care.   SLP Visit Diagnosis Dysphagia, oropharyngeal phase (R13.12) Attention and concentration deficit following -- Frontal lobe and executive function deficit following -- Impact on safety and function Moderate aspiration risk   CHL IP TREATMENT RECOMMENDATION 11/09/2016 Treatment Recommendations Therapy as outlined in treatment plan below   Prognosis 11/09/2016 Prognosis for Safe Diet Advancement Good Barriers to Reach Goals -- Barriers/Prognosis Comment -- CHL IP DIET RECOMMENDATION 11/09/2016 SLP Diet Recommendations Regular solids;Thin liquid Liquid Administration via Cup;No straw Medication Administration Crushed with puree Compensations Minimize environmental distractions;Slow rate;Small sips/bites;Chin tuck Postural Changes Seated upright at 90 degrees;Remain  semi-upright after after feeds/meals (Comment)   CHL IP OTHER RECOMMENDATIONS 11/09/2016 Recommended Consults -- Oral Care Recommendations Oral care QID;Oral care before and after PO Other Recommendations --   CHL IP FOLLOW UP RECOMMENDATIONS 11/09/2016 Follow up Recommendations Home health SLP   CHL IP FREQUENCY AND DURATION 11/09/2016 Speech Therapy Frequency (ACUTE ONLY) min 2x/week Treatment Duration 2 weeks      CHL IP ORAL PHASE 11/09/2016 Oral Phase Impaired Oral - Pudding Teaspoon -- Oral - Pudding Cup -- Oral - Honey Teaspoon -- Oral - Honey Cup -- Oral - Nectar Teaspoon -- Oral - Nectar Cup -- Oral - Nectar Straw -- Oral - Thin Teaspoon -- Oral - Thin Cup -- Oral - Thin Straw -- Oral - Puree -- Oral - Mech Soft --  Oral - Regular -- Oral - Multi-Consistency Delayed oral transit Oral - Pill -- Oral Phase - Comment --  CHL IP PHARYNGEAL PHASE 11/09/2016 Pharyngeal Phase Impaired Pharyngeal- Pudding Teaspoon -- Pharyngeal -- Pharyngeal- Pudding Cup -- Pharyngeal -- Pharyngeal- Honey Teaspoon -- Pharyngeal -- Pharyngeal- Honey Cup -- Pharyngeal -- Pharyngeal- Nectar Teaspoon Delayed swallow initiation-pyriform sinuses;Pharyngeal residue - pyriform Pharyngeal -- Pharyngeal- Nectar Cup Delayed swallow initiation-pyriform sinuses;Pharyngeal residue - pyriform Pharyngeal -- Pharyngeal- Nectar Straw -- Pharyngeal -- Pharyngeal- Thin Teaspoon Pharyngeal residue - pyriform Pharyngeal -- Pharyngeal- Thin Cup -- Pharyngeal -- Pharyngeal- Thin Straw -- Pharyngeal -- Pharyngeal- Puree Pharyngeal residue - pyriform Pharyngeal -- Pharyngeal- Mechanical Soft -- Pharyngeal -- Pharyngeal- Regular -- Pharyngeal -- Pharyngeal- Multi-consistency Pharyngeal residue - pyriform Pharyngeal -- Pharyngeal- Pill -- Pharyngeal -- Pharyngeal Comment --  CHL IP CERVICAL ESOPHAGEAL PHASE 11/09/2016 Cervical Esophageal Phase Impaired Pudding Teaspoon -- Pudding Cup -- Honey Teaspoon -- Honey Cup -- Nectar Teaspoon -- Nectar Cup -- Nectar Straw -- Thin Teaspoon -- Thin Cup -- Thin Straw -- Puree Esophageal backflow into the pharynx Mechanical Soft -- Regular -- Multi-consistency -- Pill -- Cervical Esophageal Comment Patient with some type of CP issues that were not well visualized.  Back flow from the CP segment not opening enough or closing too early most likely.   Shelly Flatten, MA, CCC-SLP Acute Rehab SLP [phone] Lamar Sprinkles 11/09/2016, 11:16 AM               Labs:  CBC:  Recent Labs  11/10/16 0305 11/11/16 0724 11/12/16 0624 11/20/16 0413  WBC 16.1* 11.3* 10.7* 10.5  HGB 10.6* 10.7* 10.5* 9.6*  HCT 33.5* 33.7* 32.1* 28.2*  PLT 214 230 209 347    COAGS:  Recent Labs  11/10/16 0305 11/11/16 0724 11/12/16 0624 11/20/16 0413  INR  1.66 1.23 1.20 4.18*    BMP:  Recent Labs  11/10/16 0305 11/11/16 0724 11/12/16 0624 11/20/16 0413  NA 141 140 140 143  K 4.4 4.1 3.6 3.2*  CL 114* 110 109 110  CO2 21* 23 24 23   GLUCOSE 118* 120* 89 82  BUN 45* 52* 46* 25*  CALCIUM 8.7* 8.9 8.7* 8.3*  CREATININE 2.87* 2.53* 2.26* 1.84*  GFRNONAA 18* 21* 24* 30*  GFRAA 20* 24* 27* 35*    LIVER FUNCTION TESTS:  Recent Labs  11/08/16 0815 11/20/16 0413  BILITOT 0.9 0.6  AST 35 19  ALT 17 13*  ALKPHOS 56 47  PROT 6.1* 5.2*  ALBUMIN 3.3* 2.5*    TUMOR MARKERS: No results for input(s): AFPTM, CEA, CA199, CHROMGRNA in the last 8760 hours.  Assessment and Plan:  83 year old with acute lower GI bleeding. Nuclear medicine study suggests the  source could be coming from sigmoid colon and represent a diverticular bleed. Patient's medical situation is complicated by a solitary left kidney and chronic kidney disease and supratherapeutic INR level. I discussed angiography and embolization procedure with the patient and his family in depth. Unfortunately, the patient is not a great candidate for angiography based on his chronic kidney disease and embolization of a presumed sigmoid colon diverticular bleed with probably very technically difficult in a 83 year old male with a known abdominal aortic aneurysm and atherosclerosis disease. There would be some risk of vascular injury during angiography. After a long discussion with the patient and the family, the patient would prefer to avoid any invasive procedures including the angiography at this time. I agree that medical management is the best approach at this time. I would like to avoid using iodinated contrast in this patient with a solitary kidney and chronic kidney disease. Hopefully the GI bleeding will stop once the patient's INR level has normalized. However, if the patient's GI bleeding becomes life-threatening then angiography and embolization will be an option.   Family also asked  about anticoagulation after the GI bleeding has resolved. I told them that placement of an IVC filter may be an option since he is on anticoagulation for a prior pulmonary embolism.  Thank you for this interesting consult.  I greatly enjoyed meeting Keegan Ducey and look forward to participating in their care.  A copy of this report was sent to the requesting provider on this date.  Electronically Signed: Carylon Perches, MD 11/20/2016, 7:57 PM   I spent a total of 40 Minutes    in face to face in clinical consultation, greater than 50% of which was counseling/coordinating care for lower GI bleeding management.

## 2016-11-20 NOTE — Progress Notes (Signed)
Patient transferred to room 1235, from [place]. NAD noted, however patient continues to have bright red blood from rectum, approx 272ml.  Dr. Allyson Sabal informed.

## 2016-11-20 NOTE — ED Notes (Signed)
Date and time results received: 11/20/16 7:49 AM   Test: INR Critical Value: 4.18  Name of Provider Notified: Dr. Florina Ou  Orders Received? Or Actions Taken?: Awaiting provider orders

## 2016-11-20 NOTE — ED Notes (Signed)
Attempted to get blood 2 times, but was unsuccessful.

## 2016-11-20 NOTE — H&P (Addendum)
[facility] History and Physical  Leonard Shepard DQQ:[id number] DOB: 1922/06/19 DOA: 11/20/2016  Referring physician:  PCP: Hennie Duos, MD   Chief Complaint: *GI bleed   HPI:  83 year old man who moved here about a year ago from [place].  He has underlying restrictive (pulmonary fibrosis) and obstructive lung disease, coronary artery disease status post stent placement, pulmonary embolism on chronic warfarin, stage III renal insufficiency, adrenal insufficiency on cortisone replacement for about the last year and a half, hypothyroidism, and a solitary left kidney status post prior resection for trauma incurred in world war 2, recently admitted 9/8-9/12 on internal medicine teaching service for aspiration pneumonia discharged on Augmentin, who presents today with bright red blood per rectum for the last 2 days, in the setting of supratherapeutic INR. Patient feels that the bleeding has steadily gotten worse, now passing large amounts of bright red blood per rectum, associated with tenesmus. He does complain of fatigue.  Patient states that his last colonoscopy was more than 5 years ago when he was told that he had diverticulosis, some polyps. Denies any chest pain or shortness of breath ED course Blood pressure (!) 165/78, pulse (!) 56, temperature 97.7 F (36.5 C), temperature source Oral, resp. rate 19, SpO2 96 %. Patient had clumps of rectal bleeding witnessed in the ED by the ED physician, he has not had a colonoscopy more than 10 years. Baseline hemoglobin around 10.7, hemoglobin now 9.6 INR 4.18, patient administered vitamin K in the ED, being admitted to step down for GI bleeding     Review of Systems: negative for the following   A complete 12 point review of systems was done with pertinent positives as documented in history of present illness   Past Medical History:  Diagnosis Date  . Aspiration pneumonia ([place])   . BPH (benign prostatic hyperplasia)   . CAD (coronary  artery disease)    s/p stent  . Chronic renal insufficiency, stage III (moderate)   . CKD (chronic kidney disease)   . COPD (chronic obstructive pulmonary disease) ([place])   . Depression   . GERD (gastroesophageal reflux disease)   . H/O unilateral nephrectomy   . HLD (hyperlipidemia)   . Oropharyngeal dysphagia 11/10/2016  . PE (pulmonary embolism)   . Postinflammatory pulmonary fibrosis ([facility])      Past Surgical History:  Procedure Laterality Date  . APPENDECTOMY    . NEPHRECTOMY Right       Social History:  reports that he has never smoked. He has never used smokeless tobacco. He reports that he does not drink alcohol or use drugs.    Allergies  Allergen Reactions  . Aclidinium Bromide Itching and Other (See Comments)    Throat irritation    Family History  Problem Relation Age of Onset  . Lung disease Father   . Leukemia Brother         Prior to Admission medications   Medication Sig Start Date End Date Taking? Authorizing Provider  amLODipine (NORVASC) 2.5 MG tablet Take 2.5 mg by mouth daily.   Yes [provider]  colesevelam (WELCHOL) 625 MG tablet Take 1,875 mg by mouth 2 (two) times daily with a meal.    Yes [provider]  diphenhydramine-acetaminophen (TYLENOL PM) 25-500 MG TABS tablet Take 2 tablets by mouth at bedtime.   Yes [provider]  fenofibrate 160 MG tablet Take 160 mg by mouth daily.   Yes [provider]  finasteride (PROSCAR) 5 MG tablet Take 5 mg  by mouth daily.   Yes [provider]  fludrocortisone (FLORINEF) 0.1 MG tablet Take 0.1 mg by mouth daily.   Yes [provider]  fluticasone (FLONASE) 50 MCG/ACT nasal spray Place 1 spray into both nostrils daily.   Yes [provider]  furosemide (LASIX) 20 MG tablet Take 20 mg by mouth daily as needed for fluid.   Yes [provider]  guaifenesin (ROBITUSSIN) 100 MG/5ML syrup Take 200 mg by mouth every 6 (six) hours as needed  for cough.   Yes [provider]  hydrocortisone (ANUSOL-HC) 25 MG suppository Place 25 mg rectally 3 (three) times daily. For 2 weeks   Yes [provider]  hydrocortisone (CORTEF) 10 MG tablet Take 1 tablet in the morning and half a tablet at night. 06/12/15  Yes Charlynne Cousins, MD  isosorbide mononitrate (IMDUR) 60 MG 24 hr tablet Take 60 mg by mouth daily.   Yes [provider]  levothyroxine (SYNTHROID, LEVOTHROID) 25 MCG tablet Take 25 mcg by mouth daily before breakfast.   Yes [provider]  Magnesium Cl-Calcium Carbonate (SLOW-MAG PO) Take 1 tablet by mouth daily.   Yes [provider]  metoprolol succinate (TOPROL-XL) 25 MG 24 hr tablet Take 25 mg by mouth daily.   Yes [provider]  ondansetron (ZOFRAN) 4 MG tablet Take 4 mg by mouth every 8 (eight) hours as needed for nausea or vomiting.   Yes [provider]  pantoprazole (PROTONIX) 40 MG tablet Take 40 mg by mouth daily.   Yes [provider]  pravastatin (PRAVACHOL) 20 MG tablet Take 20 mg by mouth daily.   Yes [provider]  sertraline (ZOLOFT) 50 MG tablet Take 50 mg by mouth daily.   Yes [provider]  warfarin (COUMADIN) 4 MG tablet Take 2-4 mg by mouth See admin instructions. Take 4 mg by mouth daily on Monday and Friday. Take 2 mg by mouth daily on all other days   Yes [provider]     Physical Exam: Vitals:   11/20/16 0700 11/20/16 0830 11/20/16 0835 11/20/16 0845  BP: (!) 147/77 (!) 157/79 (!) 157/79 (!) 147/87  Pulse: (!) 54 72 66 72  Resp: 16 11 15  (!) 21  Temp:      TempSrc:      SpO2: 98% 98% 96% 95%        Vitals:   11/20/16 0700 11/20/16 0830 11/20/16 0835 11/20/16 0845  BP: (!) 147/77 (!) 157/79 (!) 157/79 (!) 147/87  Pulse: (!) 54 72 66 72  Resp: 16 11 15  (!) 21  Temp:      TempSrc:      SpO2: 98% 98% 96% 95%   Constitutional: NAD, calm, comfortable Eyes: PERRL, lids and conjunctivae  normal ENMT: Mucous membranes are moist. Posterior pharynx clear of any exudate or lesions.Normal dentition.  Neck: normal, supple, no masses, no thyromegaly Respiratory: clear to auscultation bilaterally, no wheezing, no crackles. Normal respiratory effort. No accessory muscle use.  Cardiovascular: Regular rate and rhythm, no murmurs / rubs / gallops. No extremity edema. 2+ pedal pulses. No carotid bruits.  Abdomen: no tenderness, no masses palpated. No hepatosplenomegaly. Bowel sounds positive.  Musculoskeletal: no clubbing / cyanosis. No joint deformity upper and lower extremities. Good ROM, no contractures. Normal muscle tone.  Rectal: Passing copious bright red blood and clots with a melanotic odor, clots palpated high in rectal vault Skin: no rashes, lesions, ulcers. No induration Neurologic: CN 2-12 grossly intact. Sensation  intact, DTR normal. Strength 5/5 in all 4.  Psychiatric: Normal judgment and insight. Alert and oriented x 3. Normal mood.     Labs on Admission: I have personally reviewed following labs and imaging studies  CBC:  Recent Labs Lab 11/20/16 0413  WBC 10.5  HGB 9.6*  HCT 28.2*  MCV 90.1  PLT 371    Basic Metabolic Panel:  Recent Labs Lab 11/20/16 0413  NA 143  K 3.2*  CL 110  CO2 23  GLUCOSE 82  BUN 25*  CREATININE 1.84*  CALCIUM 8.3*    GFR: Estimated Creatinine Clearance: 31.1 mL/min (A) (by C-G formula based on SCr of 1.84 mg/dL (H)).  Liver Function Tests:  Recent Labs Lab 11/20/16 0413  AST 19  ALT 13*  ALKPHOS 47  BILITOT 0.6  PROT 5.2*  ALBUMIN 2.5*   No results for input(s): LIPASE, AMYLASE in the last 168 hours. No results for input(s): AMMONIA in the last 168 hours.  Coagulation Profile:  Recent Labs Lab 11/20/16 0413  INR 4.18*   No results for input(s): DDIMER in the last 72 hours.  Cardiac Enzymes: No results for input(s): CKTOTAL, CKMB, CKMBINDEX, TROPONINI in the last 168 hours.  BNP (last 3 results) No  results for input(s): PROBNP in the last 8760 hours.  HbA1C: No results for input(s): HGBA1C in the last 72 hours. No results found for: HGBA1C   CBG: No results for input(s): GLUCAP in the last 168 hours.  Lipid Profile: No results for input(s): CHOL, HDL, LDLCALC, TRIG, CHOLHDL, LDLDIRECT in the last 72 hours.  Thyroid Function Tests: No results for input(s): TSH, T4TOTAL, FREET4, T3FREE, THYROIDAB in the last 72 hours.  Anemia Panel: No results for input(s): VITAMINB12, FOLATE, FERRITIN, TIBC, IRON, RETICCTPCT in the last 72 hours.  Urine analysis:    Component Value Date/Time   COLORURINE YELLOW 11/08/2016 1018   APPEARANCEUR CLEAR 11/08/2016 1018   LABSPEC 1.014 11/08/2016 1018   PHURINE 5.0 11/08/2016 1018   GLUCOSEU NEGATIVE 11/08/2016 1018   HGBUR NEGATIVE 11/08/2016 1018   BILIRUBINUR NEGATIVE 11/08/2016 1018   KETONESUR NEGATIVE 11/08/2016 1018   PROTEINUR NEGATIVE 11/08/2016 1018   NITRITE NEGATIVE 11/08/2016 1018   LEUKOCYTESUR NEGATIVE 11/08/2016 1018    Sepsis Labs: @LABRCNTIP (procalcitonin:4,lacticidven:4) )No results found for this or any previous visit (from the past 240 hour(s)).       Radiological Exams on Admission: No results found. Ct Abdomen Pelvis Wo Contrast  Result Date: 11/08/2016 CLINICAL DATA:  Patient with vomiting.  Back pain. EXAM: CT ABDOMEN AND PELVIS WITHOUT CONTRAST TECHNIQUE: Multidetector CT imaging of the abdomen and pelvis was performed following the standard protocol without IV contrast. COMPARISON:  CT abdomen pelvis 06/11/2015 FINDINGS: Lower chest: Heart is mildly enlarged. Coronary arterial vascular calcifications. Interval progression of subpleural reticular opacities with associated architectural distortion. No pleural effusion. Hepatobiliary: Liver is normal in size and contour. Status post cholecystectomy. Pancreas: Unremarkable. Spleen: Unremarkable Adrenals/Urinary Tract: The adrenal glands are normal. Right kidney is  surgically absent. Stable appearance of the surgical bed. Stable appearance of the left kidney. No hydronephrosis. Mild urinary bladder wall thickening. Stomach/Bowel: Large amount of stool within the rectum. Descending and sigmoid colonic diverticulosis. No CT evidence for acute diverticulitis. Vascular/Lymphatic: Peripheral calcified atherosclerotic plaque involving the abdominal aorta. Infrarenal abdominal aortic ectasia measuring 3.8 cm, previously 3.5 cm. No retroperitoneal lymphadenopathy. Reproductive: Prostate unremarkable. Other: None. Musculoskeletal: Thoracic and lumbar spine degenerative changes. No aggressive or acute appearing osseous lesions. IMPRESSION: Large amount of stool  within the rectum compatible with constipation. Descending and sigmoid colonic diverticulosis without CT evidence for acute diverticulitis. Infrarenal abdominal aortic aneurysm, slightly increased from prior measuring 3.8 cm. Recommend followup by ultrasound in 2 years. This recommendation follows ACR consensus guidelines: White Paper of the ACR Incidental Findings Committee II on Vascular Findings. J Am Coll Radiol 2013; 10:789-794. Interval progression of subpleural reticular opacities and architectural distortion most compatible with progressed pulmonary fibrotic change. In the nonacute setting, recommend further evaluation with high-resolution chest CT. Mild urinary bladder wall thickening. Recommend correlation with urinalysis as infection not excluded. Electronically Signed   By: Lovey Newcomer M.D.   On: 11/08/2016 13:09   Dg Chest 2 View  Result Date: 11/10/2016 CLINICAL DATA:  Increase shortness of breath, pneumonia, history of COPD, coronary artery disease with stent placement, previous pulmonary embolism. EXAM: CHEST  2 VIEW COMPARISON:  Portable chest x-ray of November 09, 2016 FINDINGS: The lungs are adequately inflated. The interstitial markings remain increased with areas of patchy confluence in the right mid lung  and the left lung base. Pair radius small left pleural effusion. The cardiac silhouette remains enlarged. The central pulmonary vascularity is prominent. There is calcification in the wall of the thoracic aorta. The observed bony thorax exhibits no acute abnormality. IMPRESSION: Persistent increased lung markings as described worrisome for interstitial pneumonia. Underlying low-grade compensated CHF may be present. Overall there has not been significant interval change in the appearance of the chest since yesterday's study. Electronically Signed   By: David  Martinique M.D.   On: 11/10/2016 10:15   US Renal  Result Date: 11/09/2016 CLINICAL DATA:  Patient with oliguria and an urea. Status post right renal removal. EXAM: RENAL / URINARY TRACT ULTRASOUND COMPLETE COMPARISON:  None. FINDINGS: Right Kidney: Surgically absent. Left Kidney: Length: 14.7 cm. Normal renal cortical thickness and echogenicity. Mild cortical lobulation. No hydronephrosis. Bladder: Decompressed with Foley catheter. IMPRESSION: No hydronephrosis. Electronically Signed   By: Lovey Newcomer M.D.   On: 11/09/2016 10:52   Nm Pulmonary Vent And Perf (v/q Scan)  Result Date: 11/08/2016 CLINICAL DATA:  Evaluate for pulmonary embolism EXAM: NUCLEAR MEDICINE VENTILATION - PERFUSION LUNG SCAN TECHNIQUE: Ventilation images were obtained in multiple projections using inhaled aerosol Tc-16m DTPA. Perfusion images were obtained in multiple projections after intravenous injection of Tc-61m MAA. RADIOPHARMACEUTICALS:  29.6 mCi Technetium-41m DTPA aerosol inhalation and 4.1 mCi Technetium-61m MAA IV COMPARISON:  Chest radiograph - earlier same day ; 06/10/2015 FINDINGS: Review of chest radiograph performed earlier today demonstrates an enlarged cardiac silhouette and mediastinal contours. Interval development of ill-defined heterogeneous airspace opacities within the right mid and lower lung worrisome for areas of infection. Pulmonary vasculature appears  indistinct with cephalization of flow. Small bilateral effusions are not excluded. Ventilation: There is clumping of inhaled radiotracer about the bilateral pulmonary hila, left greater than right, with overall poor ventilation. Perfusion: There is relative homogeneous distribution of injected radiotracer throughout the pulmonary parenchyma, with slight preferential perfusion of the left lung in relation to the right. No discrete mismatched segmental or subsegmental filling defects to suggest pulmonary embolism. IMPRESSION: Pulmonary embolism absent (very low probability of pulmonary embolism). Electronically Signed   By: Sandi Mariscal M.D.   On: 11/08/2016 14:09   Dg Chest Port 1 View  Result Date: 11/09/2016 CLINICAL DATA:  Shortness of breath and pulmonary edema. EXAM: PORTABLE CHEST 1 VIEW COMPARISON:  Chest radiograph November 08, 2016 FINDINGS: Cardiac silhouette is similarly enlarged. Mediastinal silhouette is nonsuspicious. RIGHT mid and LEFT lower  lung zone consolidation. Mild lucency LEFT upper lobe consist with stent with bullous changes in COPD. Elevated RIGHT hemidiaphragm. No pleural effusion. No pneumothorax. Soft tissue planes and included osseous structures are unchanged. IMPRESSION: RIGHT mid and LEFT lower lung zone consolidation concerning for pneumonia. Followup PA and lateral chest X-ray is recommended in 3-4 weeks following trial of antibiotic therapy to ensure resolution and exclude underlying malignancy. Stable cardiomegaly. Electronically Signed   By: Elon Alas M.D.   On: 11/09/2016 03:55   Dg Chest Portable 1 View  Result Date: 11/08/2016 CLINICAL DATA:  Vomiting blood and bile. History of aspiration pneumonia. EXAM: PORTABLE CHEST 1 VIEW COMPARISON:  June 10, 2015 FINDINGS: There is new infiltrate in the right mid and lower lung. Mild opacity in left base is similar in the interval. No pneumothorax. No other interval changes. IMPRESSION: 1. New infiltrate in the right mid and  lower lung could represent pneumonia or aspiration. Aspiration should be considered given history. 2. Mild opacity in left base is similar in the interval, possibly scar or atelectasis. Electronically Signed   By: Dorise Bullion III M.D   On: 11/08/2016 08:38   Dg Swallowing Func-speech Pathology  Result Date: 11/09/2016 Objective Swallowing Evaluation: Type of Study: MBS-Modified Barium Swallow Study Patient Details Name: Melburn Treiber MRN: [id number] Date of Birth: 06-03-1922 Today's Date: 11/09/2016 Time: SLP Start Time (ACUTE ONLY): 1030-SLP Stop Time (ACUTE ONLY): 1100 SLP Time Calculation (min) (ACUTE ONLY): 30 min Past Medical History: Past Medical History: Diagnosis Date . Aspiration pneumonia ([place])  . BPH (benign prostatic hyperplasia)  . CAD (coronary artery disease)   s/p stent . CKD (chronic kidney disease)  . COPD (chronic obstructive pulmonary disease) ([place])  . Depression  . GERD (gastroesophageal reflux disease)  . HLD (hyperlipidemia)  . PE (pulmonary embolism)  Past Surgical History: Past Surgical History: Procedure Laterality Date . APPENDECTOMY   . NEPHRECTOMY Right  HPI: Mr. Formica is a 83 yo M with a past medical history of CAD, CKD IV (hx of nephrectomy x 70 yrs), pulmonary fibrosis, PE (on warfarin), adrenal insufficiency who presented to the ED with complaints of fever, hemoptysis/hematemesis. The details of the history were provided by his son who he currently lives with.   This morning the patient woke up and felt cold with chills and was generally weak with increased work of breathing. His son took his temperature with a thermometer which was 101. He began coughing and spitting up bright red blood. He also had foul-smelling vomit, also with blood. The patient was in his usual state of health yesterday, last night noted to feel queasy and did not go out to dinner with family. No other recent complaints of increased cough, nausea vomiting or diarrhea, or other symptoms. Of note, he was  admitted at an outside hospital on 3/30 for pneumonia. His son states he also had hemoptysis, fever at that time. His family denies any overt signs of aspiration in the patient.  Subjective: The patient was seen in radiology for MBS to determine current swallowing physiology and least restrictive diet.  Assessment / Plan / Recommendation CHL IP CLINICAL IMPRESSIONS 11/09/2016 Clinical Impression MBS was completed using thin liquids, nectar thick liquids, pureed material and dual textured solids.  The patient was noted to have a mild oropharyngeal dysphagia.  The oral phase was characterized by delayed oral transit given dual textured solids.  The pharyngeal phase was characterizced by a delayed swallow trigger with thin and nectar thick liquids triggering at the  pyriform sinuses and pureed material and dual textured solids triggering at the vallecular.  Of note, following the initial strip the patient was noted to clear the pharyngeal space of all material but backflow from the esophagus was noted to the level of the pyriform sinuses in some cases leading to severe residue.  Visualization of the CP segment was difficult due to the patient's position and broad shoulders.  Despite attempts to improve view the cricopharyngeal segment was never able to be fully visualized.  He did not appear to have a Zenkers Diverticulum.  It is unclear if the patient's cricopharngeal segment is tight or possibly closing prematurely.  Further assessment may be warranted as the patient is at very high risk for aspiration of the material that backflows to the pyriform sinuses.  Penetration to the cords was noted prior to the swallow given tsp sips of thin and nectar thick liquids.  Use of a chin tuck eliminated this.  Esophageal did not reveal other overt issues.  Recommend a regular diet with thin liquids.  The patient MUST TUCK CHIN for all liquid swallows.  He should also sit up for 30-60 mins after meals to assist with clearance of  the pyriform residue.  Suggest possible GI work up to fully assess esophageal function.  ST will follow up for therapeutic diet tolerance and education.  Patient may benefit from ST at next level of care.   SLP Visit Diagnosis Dysphagia, oropharyngeal phase (R13.12) Attention and concentration deficit following -- Frontal lobe and executive function deficit following -- Impact on safety and function Moderate aspiration risk   CHL IP TREATMENT RECOMMENDATION 11/09/2016 Treatment Recommendations Therapy as outlined in treatment plan below   Prognosis 11/09/2016 Prognosis for Safe Diet Advancement Good Barriers to Reach Goals -- Barriers/Prognosis Comment -- CHL IP DIET RECOMMENDATION 11/09/2016 SLP Diet Recommendations Regular solids;Thin liquid Liquid Administration via Cup;No straw Medication Administration Crushed with puree Compensations Minimize environmental distractions;Slow rate;Small sips/bites;Chin tuck Postural Changes Seated upright at 90 degrees;Remain semi-upright after after feeds/meals (Comment)   CHL IP OTHER RECOMMENDATIONS 11/09/2016 Recommended Consults -- Oral Care Recommendations Oral care QID;Oral care before and after PO Other Recommendations --   CHL IP FOLLOW UP RECOMMENDATIONS 11/09/2016 Follow up Recommendations Home health SLP   CHL IP FREQUENCY AND DURATION 11/09/2016 Speech Therapy Frequency (ACUTE ONLY) min 2x/week Treatment Duration 2 weeks      CHL IP ORAL PHASE 11/09/2016 Oral Phase Impaired Oral - Pudding Teaspoon -- Oral - Pudding Cup -- Oral - Honey Teaspoon -- Oral - Honey Cup -- Oral - Nectar Teaspoon -- Oral - Nectar Cup -- Oral - Nectar Straw -- Oral - Thin Teaspoon -- Oral - Thin Cup -- Oral - Thin Straw -- Oral - Puree -- Oral - Mech Soft -- Oral - Regular -- Oral - Multi-Consistency Delayed oral transit Oral - Pill -- Oral Phase - Comment --  CHL IP PHARYNGEAL PHASE 11/09/2016 Pharyngeal Phase Impaired Pharyngeal- Pudding Teaspoon -- Pharyngeal -- Pharyngeal- Pudding Cup -- Pharyngeal --  Pharyngeal- Honey Teaspoon -- Pharyngeal -- Pharyngeal- Honey Cup -- Pharyngeal -- Pharyngeal- Nectar Teaspoon Delayed swallow initiation-pyriform sinuses;Pharyngeal residue - pyriform Pharyngeal -- Pharyngeal- Nectar Cup Delayed swallow initiation-pyriform sinuses;Pharyngeal residue - pyriform Pharyngeal -- Pharyngeal- Nectar Straw -- Pharyngeal -- Pharyngeal- Thin Teaspoon Pharyngeal residue - pyriform Pharyngeal -- Pharyngeal- Thin Cup -- Pharyngeal -- Pharyngeal- Thin Straw -- Pharyngeal -- Pharyngeal- Puree Pharyngeal residue - pyriform Pharyngeal -- Pharyngeal- Mechanical Soft -- Pharyngeal -- Pharyngeal- Regular -- Pharyngeal -- Pharyngeal-  Multi-consistency Pharyngeal residue - pyriform Pharyngeal -- Pharyngeal- Pill -- Pharyngeal -- Pharyngeal Comment --  CHL IP CERVICAL ESOPHAGEAL PHASE 11/09/2016 Cervical Esophageal Phase Impaired Pudding Teaspoon -- Pudding Cup -- Honey Teaspoon -- Honey Cup -- Nectar Teaspoon -- Nectar Cup -- Nectar Straw -- Thin Teaspoon -- Thin Cup -- Thin Straw -- Puree Esophageal backflow into the pharynx Mechanical Soft -- Regular -- Multi-consistency -- Pill -- Cervical Esophageal Comment Patient with some type of CP issues that were not well visualized.  Back flow from the CP segment not opening enough or closing too early most likely.   Shelly Flatten, MA, CCC-SLP Acute Rehab SLP [phone] Lamar Sprinkles 11/09/2016, 11:16 AM                 EKG: Independently reviewed.    Assessment/Plan  GI bleed Likely diverticular bleeding Patient hemodynamically stable Receive vitamin K to reverse coagulopathy secondary to Coumadin Type and screen, transfuse if bleeding recurs Recheck INR later today Consulted [facility] to evaluate  Need to make a decision about the Coumadin has to be resumed, may need a colonoscopy for this reason  Oropharyngeal dysphagia Recently admitted for aspiration pneumonia, treated with Augmentin, now on nectar thick liquids  Acute on chronic kidney  disease stage IV Status post right nephrectomy, baseline creatinine is around 1.9- 2.2 Creatinine is now 1.84     GERD (gastroesophageal reflux disease)-continue PPI  History of pulmonary embolism, 2 previously  INR supratherapeutic today, patient has received vitamin K and Coumadin has been held Repeat INR later today to ensure that the INR is improving, will administer FFP if bleeding recurs     CAD (coronary artery disease)-continue beta blocker       Acquired hypothyroidism-continue Synthroid    History of adrenal insufficiency-continue hydrocortisone     Dyslipidemia-continue Pravachol  ADDENDUM NM study positive Patient continues to bleed, discussed with Dr Ardis Hughs  Will transfuse and tx to [facility] for IR procedure, Notified Dr Anselm Pancoast    DVT prophylaxis:  SCDs     Code Status Orders FULL CODE         consults called: [facility]   Family Communication: Admission, patients condition and plan of care including tests being ordered have been discussed with the patient And this family by the bedside,  who indicates understanding and agree with the plan and Code Status  Admission status: inpatient    Disposition plan: Further plan will depend as patient's clinical course evolves and further radiologic and laboratory data become available. Likely home when stable   At the time of admission, it appears that the appropriate admission status for this patient is INPATIENT .Thisis judged to be reasonable and necessary in order to provide the required intensity of service to ensure the patient's safetygiven thepresenting symptoms, physical exam findings, and initial radiographic and laboratory data in the context of their chronic comorbidities.   Reyne Dumas MD [facility] Pager [phone]  If 7PM-7AM, please contact night-coverage [email or web address] Password [email or web address]  11/20/2016, 8:59 AM

## 2016-11-20 NOTE — Consult Note (Signed)
Referring Provider: Dr. Allyson Sabal Primary Care Physician:  Hennie Duos, MD Primary Gastroenterologist:  Althia Forts  Reason for Consultation:  GI bleed  HPI: Leonard Shepard is a 83 y.o. male who moved here about a year ago from [place]. He has underlying restrictive (pulmonary fibrosis) and obstructive lung disease, coronary artery disease status post stent placement, pulmonary embolism on chronic warfarin, stage III renal insufficiency, adrenal insufficiency on cortisone replacement for about the last year and a half, hypothyroidism, and a solitary left kidney status post prior resection for trauma incurred in world war [place],.  He was recently admitted 9/8-9/12 on internal medicine teaching service for aspiration pneumonia discharged on Augmentin.  Was discharged to rehab facility, but usually lives with his son here in [place].  He presented to [facility] ED today with bright red blood per rectum for the last 2 days, in the setting of supratherapeutic INR of 4.18. Bleeding began yesterday morning and continued throughout the day, worsening so he was finally brought in this AM.  Now passing large amounts of bright red blood per rectum, associated with tenesmus. He does complain of fatigue and weakness. Denies any chest pain or shortness of breath.  ED course Blood pressure (!) 165/78, pulse (!) 56, temperature 97.7 F (36.5 C), temperature source Oral, resp. rate 19, SpO2 96%.  Patient had rectal bleeding with clots witnessed in the ED by the ED physician.  Baseline hemoglobin around 10.7, hemoglobin now 9.6 grams.  INR 4.18, patient administered vitamin K in the ED, being admitted to step down for GI bleeding.  He tells me that he had a colonoscopy between 5 and 10 years ago, only recalls having diverticulosis at that time.  Has had 2 or 3 colonoscopies in his life and thinks that he had some polyps previously.  He denies abdominal pain, just has pressure when needs to have a BM.  Never had any  bleeding similar to this in the past.   Past Medical History:  Diagnosis Date  . Aspiration pneumonia ([place])   . BPH (benign prostatic hyperplasia)   . CAD (coronary artery disease)    s/p stent  . Chronic renal insufficiency, stage III (moderate)   . CKD (chronic kidney disease)   . COPD (chronic obstructive pulmonary disease) ([place])   . Depression   . GERD (gastroesophageal reflux disease)   . H/O unilateral nephrectomy   . HLD (hyperlipidemia)   . Oropharyngeal dysphagia 11/10/2016  . PE (pulmonary embolism)   . Postinflammatory pulmonary fibrosis (HCC)     Past Surgical History:  Procedure Laterality Date  . APPENDECTOMY    . NEPHRECTOMY Right     Prior to Admission medications   Medication Sig Start Date End Date Taking? Authorizing Provider  amLODipine (NORVASC) 2.5 MG tablet Take 2.5 mg by mouth daily.   Yes [provider]  colesevelam (WELCHOL) 625 MG tablet Take 1,875 mg by mouth 2 (two) times daily with a meal.    Yes [provider]  diphenhydramine-acetaminophen (TYLENOL PM) 25-500 MG TABS tablet Take 2 tablets by mouth at bedtime.   Yes [provider]  fenofibrate 160 MG tablet Take 160 mg by mouth daily.   Yes [provider]  finasteride (PROSCAR) 5 MG tablet Take 5 mg by mouth daily.   Yes [provider]  fludrocortisone (FLORINEF) 0.1 MG tablet Take 0.1 mg by mouth daily.   Yes [provider]  fluticasone (FLONASE) 50 MCG/ACT nasal spray Place 1 spray into both nostrils daily.  Yes [provider]  furosemide (LASIX) 20 MG tablet Take 20 mg by mouth daily as needed for fluid.   Yes [provider]  guaifenesin (ROBITUSSIN) 100 MG/5ML syrup Take 200 mg by mouth every 6 (six) hours as needed for cough.   Yes [provider]  hydrocortisone (ANUSOL-HC) 25 MG suppository Place 25 mg rectally 3 (three) times daily. For 2 weeks   Yes [provider]  hydrocortisone (CORTEF) 10  MG tablet Take 1 tablet in the morning and half a tablet at night. 06/12/15  Yes Charlynne Cousins, MD  isosorbide mononitrate (IMDUR) 60 MG 24 hr tablet Take 60 mg by mouth daily.   Yes [provider]  levothyroxine (SYNTHROID, LEVOTHROID) 25 MCG tablet Take 25 mcg by mouth daily before breakfast.   Yes [provider]  Magnesium Cl-Calcium Carbonate (SLOW-MAG PO) Take 1 tablet by mouth daily.   Yes [provider]  metoprolol succinate (TOPROL-XL) 25 MG 24 hr tablet Take 25 mg by mouth daily.   Yes [provider]  ondansetron (ZOFRAN) 4 MG tablet Take 4 mg by mouth every 8 (eight) hours as needed for nausea or vomiting.   Yes [provider]  pantoprazole (PROTONIX) 40 MG tablet Take 40 mg by mouth daily.   Yes [provider]  pravastatin (PRAVACHOL) 20 MG tablet Take 20 mg by mouth daily.   Yes [provider]  sertraline (ZOLOFT) 50 MG tablet Take 50 mg by mouth daily.   Yes [provider]  warfarin (COUMADIN) 4 MG tablet Take 2-4 mg by mouth See admin instructions. Take 4 mg by mouth daily on Monday and Friday. Take 2 mg by mouth daily on all other days   Yes [provider]    Current Facility-Administered Medications  Medication Dose Route Frequency Provider Last Rate Last Dose  . acetaminophen (TYLENOL) tablet 650 mg  650 mg Oral Q6H PRN Reyne Dumas, MD       Or  . acetaminophen (TYLENOL) suppository 650 mg  650 mg Rectal Q6H PRN Reyne Dumas, MD      . levalbuterol (XOPENEX) nebulizer solution 0.63 mg  0.63 mg Nebulization Q6H PRN Reyne Dumas, MD      . ondansetron (ZOFRAN) tablet 4 mg  4 mg Oral Q6H PRN Reyne Dumas, MD       Or  . ondansetron (ZOFRAN) injection 4 mg  4 mg Intravenous Q6H PRN Reyne Dumas, MD       Current Outpatient Prescriptions  Medication Sig Dispense Refill  . amLODipine (NORVASC) 2.5 MG tablet Take 2.5 mg by mouth daily.    . colesevelam (WELCHOL) 625 MG tablet Take  1,875 mg by mouth 2 (two) times daily with a meal.     . diphenhydramine-acetaminophen (TYLENOL PM) 25-500 MG TABS tablet Take 2 tablets by mouth at bedtime.    . fenofibrate 160 MG tablet Take 160 mg by mouth daily.    . finasteride (PROSCAR) 5 MG tablet Take 5 mg by mouth daily.    . fludrocortisone (FLORINEF) 0.1 MG tablet Take 0.1 mg by mouth daily.    . fluticasone (FLONASE) 50 MCG/ACT nasal spray Place 1 spray into both nostrils daily.    . furosemide (LASIX) 20 MG tablet Take 20 mg by mouth daily as needed for fluid.    Marland Kitchen guaifenesin (ROBITUSSIN) 100 MG/5ML syrup Take 200 mg by mouth every 6 (six) hours as needed for cough.    . hydrocortisone (ANUSOL-HC) 25 MG suppository  Place 25 mg rectally 3 (three) times daily. For 2 weeks    . hydrocortisone (CORTEF) 10 MG tablet Take 1 tablet in the morning and half a tablet at night. 45 tablet 2  . isosorbide mononitrate (IMDUR) 60 MG 24 hr tablet Take 60 mg by mouth daily.    Marland Kitchen levothyroxine (SYNTHROID, LEVOTHROID) 25 MCG tablet Take 25 mcg by mouth daily before breakfast.    . Magnesium Cl-Calcium Carbonate (SLOW-MAG PO) Take 1 tablet by mouth daily.    . metoprolol succinate (TOPROL-XL) 25 MG 24 hr tablet Take 25 mg by mouth daily.    . ondansetron (ZOFRAN) 4 MG tablet Take 4 mg by mouth every 8 (eight) hours as needed for nausea or vomiting.    . pantoprazole (PROTONIX) 40 MG tablet Take 40 mg by mouth daily.    . pravastatin (PRAVACHOL) 20 MG tablet Take 20 mg by mouth daily.    . sertraline (ZOLOFT) 50 MG tablet Take 50 mg by mouth daily.    Marland Kitchen warfarin (COUMADIN) 4 MG tablet Take 2-4 mg by mouth See admin instructions. Take 4 mg by mouth daily on Monday and Friday. Take 2 mg by mouth daily on all other days      Allergies as of 11/20/2016 - Review Complete 11/20/2016  Allergen Reaction Noted  . Aclidinium bromide Itching and Other (See Comments) 06/11/2015    Family History  Problem Relation Age of Onset  . Lung disease Father   .  Leukemia Brother     Social History   Social History  . Marital status: Widowed    Spouse name: N/A  . Number of children: N/A  . Years of education: N/A   Occupational History  . Not on file.   Social History Main Topics  . Smoking status: Never Smoker  . Smokeless tobacco: Never Used  . Alcohol use No  . Drug use: No  . Sexual activity: Not Currently   Other Topics Concern  . Not on file   Social History Narrative   Admitted to [facility] 11/12/16   Widowed   Never smoked   Alcohol none   Full Code    Review of Systems: ROS is O/W negative except as mentioned in HPI.  Physical Exam: Vital signs in last 24 hours: Temp:  [97.6 F (36.4 C)-97.7 F (36.5 C)] 97.6 F (36.4 C) (09/20 0657) Pulse Rate:  [54-72] 72 (09/20 0845) Resp:  [11-21] 21 (09/20 0845) BP: (147-165)/(71-87) 147/87 (09/20 0845) SpO2:  [95 %-100 %] 95 % (09/20 0845)   General:  Alert, Well-developed, well-nourished, pleasant and cooperative in NAD Head:  Normocephalic and atraumatic. Eyes:  Sclera clear, no icterus.  Conjunctiva pink. Ears:  Normal auditory acuity. Mouth:  No deformity or lesions.   Lungs:  Clear throughout to auscultation.  No wheezes, crackles, or rhonchi.  Heart:  Regular rate and rhythm; no murmurs, clicks, rubs,  or gallops. Abdomen:  Soft, non-distended.  BS present.  Non-tender. Rectal:  Deferred  Msk:  Symmetrical without gross deformities. Pulses:  Normal pulses noted. Extremities:  Without clubbing or edema. Neurologic:  Alert and oriented x 4;  grossly normal neurologically. Skin:  Intact without significant lesions or rashes. Psych:  Alert and cooperative. Normal mood and affect.  Intake/Output this shift: Total I/O In: 500 [IV Piggyback:500] Out: -   Lab Results:  Recent Labs  11/20/16 0413  WBC 10.5  HGB 9.6*  HCT 28.2*  PLT 347   BMET  Recent Labs  11/20/16 0413  NA 143  K 3.2*  CL 110  CO2 23  GLUCOSE 82  BUN 25*    CREATININE 1.84*  CALCIUM 8.3*   LFT  Recent Labs  11/20/16 0413  PROT 5.2*  ALBUMIN 2.5*  AST 19  ALT 13*  ALKPHOS 47  BILITOT 0.6   PT/INR  Recent Labs  11/20/16 0413  LABPROT 40.0*  INR 4.18*   IMPRESSION:  -LGIB:  Suspect diverticular in origin, but cancer and other etiologies cannot be excluded.  Last colonoscopy over 10 years ago. -Acute blood loss anemia on top of anemia or chronic disease:  Hgb down about one gram or so from baseline. -History of PE, on coumadin -Supratherapeutic INR:  4.18 on admission.  Giving Vitamin K.  PLAN: -Will order nuc med bleeding scan and if positive then will need IR involvement for angiography. -Monitor Hgb and transfuse prn. -Supportive care.   ZEHR, JESSICA D.  11/20/2016, 10:09 AM  Pager number [phone]  GI ATTENDING  History, laboratories, x-rays reviewed. Patient personally seen and examined in the emergency room. Family at bedside. Agree with comprehensive consultation note as outlined above. Obese elderly male with multiple medical problems on chronic anticoagulation who presents with acute lower GI bleeding. Acute blood loss anemia. Diverticular change on imaging. Has undergone previous colonoscopies. His clinical presentation is consistent with acute diverticular bleed. Hemodynamically stable. Agree with correction of coagulopathy and transfusions as needed. Nuclear medicine bleeding scan has already been ordered and seen to be performed. Hopefully his bleeding will stop with correction of coagulopathy and supportive care. I discussed with the family other potential role for other interventions including colonoscopy, interventional radiology, and surgery. Questions answered to their satisfaction. We will follow closely.  Docia Chuck. Geri [place]in[place]., M.D. [facility] Division of Gastroenterology

## 2016-11-20 NOTE — ED Notes (Signed)
Pt is being transferred to [facility] at this time. Pt will be taken to 1235 after procedure complete. ICU charge RN aware

## 2016-11-20 NOTE — ED Notes (Signed)
Bed: IP77 Expected date:  Expected time:  Means of arrival:  Comments: EMS GI bleed with right lower abdominal pain

## 2016-11-20 NOTE — ED Provider Notes (Signed)
[place] DEPT Provider Note: Georgena Spurling, MD, FACEP  CSN: [id number] MRN: [id number] ARRIVAL: 11/20/16 at [place]: [place]  Rectal Bleeding   HISTORY OF PRESENT ILLNESS  11/20/16 5:50 AM Leonard Shepard is a 83 y.o. male who is currently in a rehabilitation facility following hospitalization for pneumonia. He is here with several days of rectal bleeding. He suspects, but is not sure, this may be due to hemorrhoid. He states the bleeding has steadily worsened and he is now passing fairly large quantities of bright red blood. His stools have sometimes been maroon but recently have been a brown color. He is having discomfort in his rectum which she describes as a sensation of needing to move his bowels. He has been fatigued more than usual since yesterday. He denies chest pain or shortness of breath. He is on Coumadin. He has been having increased edema in his lower legs worse than baseline.   Past Medical History:  Diagnosis Date  . Aspiration pneumonia ([place])   . BPH (benign prostatic hyperplasia)   . CAD (coronary artery disease)    s/p stent  . Chronic renal insufficiency, stage III (moderate)   . CKD (chronic kidney disease)   . COPD (chronic obstructive pulmonary disease) ([place])   . Depression   . GERD (gastroesophageal reflux disease)   . H/O unilateral nephrectomy   . HLD (hyperlipidemia)   . Oropharyngeal dysphagia 11/10/2016  . PE (pulmonary embolism)   . Postinflammatory pulmonary fibrosis (HCC)     Past Surgical History:  Procedure Laterality Date  . APPENDECTOMY    . NEPHRECTOMY Right     Family History  Problem Relation Age of Onset  . Lung disease Father   . Leukemia Brother     Social History  Substance Use Topics  . Smoking status: Never Smoker  . Smokeless tobacco: Never Used  . Alcohol use No    Prior to Admission medications   Medication Sig Start Date End Date Taking? Authorizing Provider  amLODipine (NORVASC) 2.5 MG  tablet Take 2.5 mg by mouth daily.   Yes [provider]  colesevelam (WELCHOL) 625 MG tablet Take 1,875 mg by mouth 2 (two) times daily with a meal.    Yes [provider]  diphenhydramine-acetaminophen (TYLENOL PM) 25-500 MG TABS tablet Take 2 tablets by mouth at bedtime.   Yes [provider]  fenofibrate 160 MG tablet Take 160 mg by mouth daily.   Yes [provider]  finasteride (PROSCAR) 5 MG tablet Take 5 mg by mouth daily.   Yes [provider]  fludrocortisone (FLORINEF) 0.1 MG tablet Take 0.1 mg by mouth daily.   Yes [provider]  fluticasone (FLONASE) 50 MCG/ACT nasal spray Place 1 spray into both nostrils daily.   Yes [provider]  furosemide (LASIX) 20 MG tablet Take 20 mg by mouth daily as needed for fluid.   Yes [provider]  guaifenesin (ROBITUSSIN) 100 MG/5ML syrup Take 200 mg by mouth every 6 (six) hours as needed for cough.   Yes [provider]  hydrocortisone (ANUSOL-HC) 25 MG suppository Place 25 mg rectally 3 (three) times daily. For 2 weeks   Yes [provider]  hydrocortisone (CORTEF) 10 MG tablet Take 1 tablet in the morning and half a tablet at night. 06/12/15  Yes Charlynne Cousins, MD  isosorbide mononitrate (IMDUR) 60 MG 24 hr tablet Take 60 mg by mouth daily.   Yes [provider]  levothyroxine (SYNTHROID, LEVOTHROID) 25 MCG tablet Take 25 mcg by mouth daily before breakfast.   Yes [provider]  Magnesium Cl-Calcium Carbonate (SLOW-MAG PO) Take 1 tablet by mouth daily.   Yes [provider]  metoprolol succinate (TOPROL-XL) 25 MG 24 hr tablet Take 25 mg by mouth daily.   Yes [provider]  ondansetron (ZOFRAN) 4 MG tablet Take 4 mg by mouth every 8 (eight) hours as needed for nausea or vomiting.   Yes [provider]  pantoprazole (PROTONIX) 40 MG tablet Take 40 mg by mouth daily.   Yes [provider]    pravastatin (PRAVACHOL) 20 MG tablet Take 20 mg by mouth daily.   Yes [provider]  sertraline (ZOLOFT) 50 MG tablet Take 50 mg by mouth daily.   Yes [provider]  warfarin (COUMADIN) 4 MG tablet Take 2-4 mg by mouth See admin instructions. Take 4 mg by mouth daily on Monday and Friday. Take 2 mg by mouth daily on all other days   Yes [provider]    Allergies Aclidinium bromide   REVIEW OF SYSTEMS  Negative except as noted here or in the History of Present Illness.   PHYSICAL EXAMINATION  Initial Vital Signs Blood pressure (!) 165/78, pulse (!) 56, temperature 97.7 F (36.5 C), temperature source Oral, resp. rate 19, SpO2 96 %.  Examination General: Well-developed, well-nourished male in no acute distress; appearance consistent with age of record HENT: normocephalic; atraumatic Eyes: pupils equal, round and reactive to light; extraocular muscles intact; lens implants Neck: supple Heart: regular rate and rhythm with frequent ectopy Lungs: clear to auscultation bilaterally Abdomen: soft; nondistended; suprapubic tenderness; bowel sounds present Rectal: Passing copious bright red blood and clots with a melanotic odor, clots palpated high in rectal vault Extremities: No deformity; full range of motion; 2+ pitting edema of lower legs and feet Neurologic: Awake, alert and oriented; motor function intact in all extremities and symmetric; no facial droop Skin: Warm and dry Psychiatric: Normal mood and affect   RESULTS  Summary of this visit's results, reviewed by myself:   EKG Interpretation  Date/Time:  Thursday November 20 2016 06:50:15 EDT Ventricular Rate:  58 PR Interval:    QRS Duration: 111 QT Interval:  467 QTC Calculation: 459 R Axis:   -17 Text Interpretation:  Sinus rhythm Atrial premature complexes Borderline left axis deviation PACs not seen previously Confirmed by Anthea Udovich [phone]) on 11/20/2016 6:57:54 AM       Laboratory Studies: Results for orders placed or performed during the hospital encounter of 11/20/16 (from the past 24 hour(s))  Comprehensive metabolic panel     Status: Abnormal   Collection Time: 11/20/16  4:13 AM  Result Value Ref Range   Sodium 143 135 - 145 mmol/L   Potassium 3.2 (L) 3.5 - 5.1 mmol/L   Chloride 110 101 - 111 mmol/L   CO2 23 22 - 32 mmol/L   Glucose, Bld 82 65 - 99 mg/dL   BUN 25 (H) 6 - 20 mg/dL   Creatinine, Ser 1.84 (H) 0.61 - 1.24 mg/dL   Calcium 8.3 (L) 8.9 - 10.3 mg/dL   Total Protein 5.2 (L) 6.5 - 8.1 g/dL   Albumin 2.5 (L) 3.5 - 5.0 g/dL   AST 19 15 - 41 U/L   ALT 13 (L) 17 - 63 U/L   Alkaline Phosphatase 47 38 - 126 U/L   Total Bilirubin 0.6 0.3 - 1.2 mg/dL   GFR calc non Af  Amer 30 (L) >60 mL/min   GFR calc Af Amer 35 (L) >60 mL/min   Anion gap 10 5 - 15  CBC     Status: Abnormal   Collection Time: 11/20/16  4:13 AM  Result Value Ref Range   WBC 10.5 4.0 - 10.5 K/uL   RBC 3.13 (L) 4.22 - 5.81 MIL/uL   Hemoglobin 9.6 (L) 13.0 - 17.0 g/dL   HCT 28.2 (L) 39.0 - 52.0 %   MCV 90.1 78.0 - 100.0 fL   MCH 30.7 26.0 - 34.0 pg   MCHC 34.0 30.0 - 36.0 g/dL   RDW 15.8 (H) 11.5 - 15.5 %   Platelets 347 150 - 400 K/uL  Protime-INR - (order if Patient is taking Coumadin / Warfarin)     Status: Abnormal   Collection Time: 11/20/16  4:13 AM  Result Value Ref Range   Prothrombin Time 40.0 (H) 11.4 - 15.2 seconds   INR 4.18 (HH)   Type and screen [place]     Status: None   Collection Time: 11/20/16  6:30 AM  Result Value Ref Range   ABO/RH(D) A POS    Antibody Screen NEG    Sample Expiration 11/23/2016    Imaging Studies: No results found.  ED COURSE  Nursing notes and initial vitals signs, including pulse oximetry, reviewed.  Vitals:   11/20/16 0451 11/20/16 0657 11/20/16 0700  BP: (!) 165/78 (!) 159/71 (!) 147/77  Pulse: (!) 56 (!) 57 (!) 54  Resp: 19 15 16   Temp: 97.7 F (36.5 C) 97.6 F (36.4 C)   TempSrc: Oral  Oral   SpO2: 96% 100% 98%   8:05 AM Vitamin K ordered for elevated INR in setting of acute GI bleed. Hospitalist to admit.  PROCEDURES   CRITICAL CARE Performed by: Shanon Rosser L Total critical care time: 30 minutes Critical care time was exclusive of separately billable procedures and treating other patients. Critical care was necessary to treat or prevent imminent or life-threatening deterioration. Critical care was time spent personally by me on the following activities: development of treatment plan with patient and/or surrogate as well as nursing, discussions with consultants, evaluation of patient's response to treatment, examination of patient, obtaining history from patient or surrogate, ordering and performing treatments and interventions, ordering and review of laboratory studies, ordering and review of radiographic studies, pulse oximetry and re-evaluation of patient's condition.   ED DIAGNOSES     ICD-10-CM   1. Lower GI bleed K92.2   2. Anemia due to acute blood loss D62        Riana Tessmer, Jenny Reichmann, MD 11/20/16 [phone]

## 2016-11-20 NOTE — Progress Notes (Signed)
PA requested that FFP be given prior to PRBCs.

## 2016-11-20 NOTE — Clinical Social Work Note (Signed)
Clinical Social Work Assessment  Patient Details  Name: Leonard Shepard MRN: [id number] Date of Birth: 18-Aug-1922  Date of referral:  11/20/16               Reason for consult:  Facility Placement, Discharge Planning                Permission sought to share information with:  Chartered certified accountant granted to share information::  Yes, Verbal Permission Granted  Name::        Agency::     Relationship::     Contact Information:     Housing/Transportation Living arrangements for the past 2 months:  [place], [place] of Information:  Adult Children Leonard Shepard. ) Patient Interpreter Needed:  None Criminal Activity/Legal Involvement Pertinent to Current Situation/Hospitalization:    Significant Relationships:  Adult Children Lives with:  Adult Children Do you feel safe going back to the place where you live?  Yes Need for family participation in patient care:  Yes (Comment)  Care giving concerns:  Patient was discharged on 9/12 from [facility] to [facility].    Social Worker assessment / plan:  CSW met with patient and family via bedside to discuss current living situation and discharge planning. Patient was currently sleeping however patients son, Leonard Shepard, was able to answer questions. Patient was recently discharged from [facility] on 9/12 to [facility] for short term rehab. Son states patients plan at this time is to return to [facility] at discharge to regain his strength. Patient will return home with son, Leonard Shepard, once no longer needing short term physical therapy. Patient has been living with son/ family for "a long time". Family has no concerns or questions at this time.   Employment status:  Retired Forensic scientist:  Medicare PT Recommendations:  Not assessed at this time Information / Referral to community resources:     Patient/Family's Response to care:  Family appreciated CSW.   Patient/Family's Understanding of and  Emotional Response to Diagnosis, Current Treatment, and Prognosis:  Unknown at this time.   Emotional Assessment Appearance:  Appears stated age Attitude/Demeanor/Rapport:    Affect (typically observed):    Orientation:    Alcohol / Substance use:    Psych involvement (Current and /or in the community):     Discharge Needs  Concerns to be addressed:  No discharge needs identified Readmission within the last 30 days:  Yes Current discharge risk:  None Barriers to Discharge:  No Barriers Identified   Weston Anna, LCSW 11/20/2016, 2:40 PM

## 2016-11-21 ENCOUNTER — Inpatient Hospital Stay (HOSPITAL_COMMUNITY): Payer: Medicare Other

## 2016-11-21 DIAGNOSIS — I2699 Other pulmonary embolism without acute cor pulmonale: Secondary | ICD-10-CM

## 2016-11-21 DIAGNOSIS — Z86711 Personal history of pulmonary embolism: Secondary | ICD-10-CM

## 2016-11-21 DIAGNOSIS — K5733 Diverticulitis of large intestine without perforation or abscess with bleeding: Secondary | ICD-10-CM

## 2016-11-21 DIAGNOSIS — D689 Coagulation defect, unspecified: Secondary | ICD-10-CM

## 2016-11-21 DIAGNOSIS — K5791 Diverticulosis of intestine, part unspecified, without perforation or abscess with bleeding: Secondary | ICD-10-CM

## 2016-11-21 DIAGNOSIS — D62 Acute posthemorrhagic anemia: Secondary | ICD-10-CM

## 2016-11-21 LAB — CBC
HEMATOCRIT: 25 % — AB (ref 39.0–52.0)
HEMOGLOBIN: 8.7 g/dL — AB (ref 13.0–17.0)
MCH: 31 pg (ref 26.0–34.0)
MCHC: 34.8 g/dL (ref 30.0–36.0)
MCV: 89 fL (ref 78.0–100.0)
Platelets: 293 10*3/uL (ref 150–400)
RBC: 2.81 MIL/uL — AB (ref 4.22–5.81)
RDW: 14.8 % (ref 11.5–15.5)
WBC: 9.7 10*3/uL (ref 4.0–10.5)

## 2016-11-21 LAB — PREPARE FRESH FROZEN PLASMA
Unit division: 0
Unit division: 0

## 2016-11-21 LAB — COMPREHENSIVE METABOLIC PANEL
ALBUMIN: 2.6 g/dL — AB (ref 3.5–5.0)
ALT: 14 U/L — ABNORMAL LOW (ref 17–63)
ANION GAP: 11 (ref 5–15)
AST: 25 U/L (ref 15–41)
Alkaline Phosphatase: 47 U/L (ref 38–126)
BUN: 29 mg/dL — ABNORMAL HIGH (ref 6–20)
CALCIUM: 8.1 mg/dL — AB (ref 8.9–10.3)
CHLORIDE: 111 mmol/L (ref 101–111)
CO2: 22 mmol/L (ref 22–32)
Creatinine, Ser: 1.72 mg/dL — ABNORMAL HIGH (ref 0.61–1.24)
GFR calc non Af Amer: 33 mL/min — ABNORMAL LOW (ref >60)
GFR, EST AFRICAN AMERICAN: 38 mL/min — AB (ref >60)
GLUCOSE: 78 mg/dL (ref 65–99)
POTASSIUM: 3.4 mmol/L — AB (ref 3.5–5.1)
SODIUM: 144 mmol/L (ref 135–145)
Total Bilirubin: 2.2 mg/dL — ABNORMAL HIGH (ref 0.3–1.2)
Total Protein: 5.2 g/dL — ABNORMAL LOW (ref 6.5–8.1)

## 2016-11-21 LAB — BPAM FFP
BLOOD PRODUCT EXPIRATION DATE: 201809252359
Blood Product Expiration Date: 201809252359
ISSUE DATE / TIME: 201809202054
ISSUE DATE / TIME: 201809202318
UNIT TYPE AND RH: 6200
Unit Type and Rh: 6200

## 2016-11-21 LAB — PROTIME-INR
INR: 1.25
PROTHROMBIN TIME: 15.6 s — AB (ref 11.4–15.2)

## 2016-11-21 MED ORDER — AMLODIPINE BESYLATE 5 MG PO TABS
2.5000 mg | ORAL_TABLET | Freq: Every day | ORAL | Status: DC
  Administered 2016-11-21: 2.5 mg via ORAL
  Filled 2016-11-21: qty 1

## 2016-11-21 MED ORDER — POTASSIUM CHLORIDE CRYS ER 20 MEQ PO TBCR
40.0000 meq | EXTENDED_RELEASE_TABLET | Freq: Two times a day (BID) | ORAL | Status: AC
  Administered 2016-11-21 (×2): 40 meq via ORAL
  Filled 2016-11-21 (×2): qty 2

## 2016-11-21 NOTE — Progress Notes (Signed)
No further rectal bleeding since 0100.

## 2016-11-21 NOTE — Progress Notes (Signed)
Fredderick Severance NP notified of Pts continued bloody stool (mostly frank blood/clots).  3 episodes since 1900.   She requested I notifiy on call GI and I called and updated Dr. Ardis Hughs.

## 2016-11-21 NOTE — Progress Notes (Signed)
[facility] Progress Note  Subjective:  Feels ok.  Last bleeding was at about 1 AM and none since.  Slept well after that.    Objective:  Vital signs in last 24 hours: Temp:  [97.5 F (36.4 C)-98.8 F (37.1 C)] 98.3 F (36.8 C) (09/21 0800) Pulse Rate:  [60-89] 71 (09/21 0918) Resp:  [14-30] 20 (09/21 0900) BP: (82-179)/(36-90) 168/78 (09/21 0918) SpO2:  [92 %-100 %] 98 % (09/21 0900)   General:  Alert, Well-developed, in NAD Heart:  Regular rate and rhythm; no murmurs Pulm:  CTAB.  No increased WOB. Abdomen:  Soft, non-distended.  BS present and hyperactive.  Non-tender. Extremities:  Without edema. Neurologic:  Alert and oriented x 4;  grossly normal neurologically. Psych:  Alert and cooperative. Normal mood and affect.  Intake/Output from previous day: 09/20 0701 - 09/21 0700 In: 1914 [I.V.:365; Blood:999; IV Piggyback:550] Out: 1050 [Urine:600; Stool:450]  Lab Results:  Recent Labs  11/20/16 0413 11/20/16 1913  WBC 10.5 17.1*  HGB 9.6* 8.6*  HCT 28.2* 25.8*  PLT 347 344   BMET  Recent Labs  11/20/16 0413  NA 143  K 3.2*  CL 110  CO2 23  GLUCOSE 82  BUN 25*  CREATININE 1.84*  CALCIUM 8.3*   LFT  Recent Labs  11/20/16 0413  PROT 5.2*  ALBUMIN 2.5*  AST 19  ALT 13*  ALKPHOS 47  BILITOT 0.6   PT/INR  Recent Labs  11/20/16 0413 11/20/16 1913  LABPROT 40.0* 19.6*  INR 4.18* 1.68   Nm Gi Blood Loss  Addendum Date: 11/20/2016   ADDENDUM REPORT: 11/20/2016 20:00 ADDENDUM: I discussed these findings by telephone with Dr. Allyson Sabal at approximately 1830 hours on 11/20/2016. Electronically Signed   By: Misty Stanley M.D.   On: 11/20/2016 20:00   Result Date: 11/20/2016 CLINICAL DATA:  GI bleed intermittent for 2 days. EXAM: NUCLEAR MEDICINE GASTROINTESTINAL BLEEDING SCAN TECHNIQUE: Sequential abdominal images were obtained following intravenous administration of Tc-49m labeled red blood cells. RADIOPHARMACEUTICALS:  27.4 mCi  Tc-55m in-vitro labeled red cells. COMPARISON:  CT scan from 04/10/ 2017 FINDINGS: There is an area of extravascular tracer accumulation in the lower abdomen/pelvis with some linear retrograde movement during the exam. This collection appears to clear between the first and second hours of imaging and the second hour of planar imaging shows further accumulation of radiotracer in the same region, but to a greater degree. Some portions of the tracer accumulation on the second hour do clear but there is no gross migration of the apparent extra vascular radiotracer. Movement of the radiotracer is more apparent on the stacked cine images. Reviewing coronal imaging from the previous CT scan, this tracer accumulation is in the region of the patient's sigmoid colon and has a similar configuration. IMPRESSION: Imaging features are highly suspicious for active bleeding in the sigmoid colon. Electronically Signed: By: Misty Stanley M.D. On: 11/20/2016 18:23   Assessment / Plan: -LGIB:  Suspect diverticular in origin, but cancer and other etiologies cannot be excluded.  Last colonoscopy over 10 years ago.  NM bleeding scan positive for area of sigmoid colon.  IR discussed with patient and family, and they decided against angiography with embolization for now due to risk of vascular injury and kidney damage.   -Acute blood loss anemia on top of anemia or chronic disease:  Hgb down to 8.6 grams this AM despite one unit PRBC's. -History of PE, on coumadin at home.  ? To resume on  discharge vs IVC filter or other options. -Supratherapeutic INR:  4.18 on admission.  Was given Vitamin K and FFP.  INR 1.68 this AM.  *Continue to monitor Hgb closely. *If at least 12 hours without bleeding then can try clear liquids. *If patient continues to bleed significantly then IR may need to be contacted and have discussion to reconsider intervention.   LOS: 1 day   ZEHR, JESSICA D.  11/21/2016, 9:57 AM  Pager number [phone]  GI  ATTENDING  Interval history and data reviewed. Patient seen and examined. Agree with interval progress note as outlined. Bleeding has stopped with correction of coagulopathy. This is almost certainly a diverticular bleed. Continue with close observation and supportive care as indicated. GI will continue to follow. Dr. Havery Moros on call this weekend for our team. Thanks.   Docia Chuck. Geri [place]in[place]., M.D. [facility] Division of Gastroenterology

## 2016-11-21 NOTE — Care Management Note (Signed)
Case Management Note  Patient Details  Name: Leonard Shepard MRN: [id number] Date of Birth: [birth year]  Subjective/Objective:                  Active rectal bleeding  Action/Plan: November 20, 2016 Velva Harman, BSN, [place],  [place] Chart reviewed for case management and discharge needs. Next review date: [id number] Expected Discharge Date:   (UNKNOWN)               Expected Discharge Plan:  [place]  In-House Referral:  Clinical Social Work  Discharge planning Services  CM Consult  Post Acute Care Choice:    Choice offered to:     DME Arranged:    DME Agency:     HH Arranged:    [place] Agency:     Status of Service:  In process, will continue to follow  If discussed at Long Length of Stay Meetings, dates discussed:    Additional Comments:  Leeroy Cha, RN 11/21/2016, 8:29 AM

## 2016-11-21 NOTE — Progress Notes (Addendum)
PROGRESS NOTE    Leonard Shepard  KXF:[id number] DOB: 1922-10-20 DOA: 11/20/2016 PCP: Hennie Duos, MD   Brief Narrative: 83 year old male with history of obstructive lung disease, coronary artery disease status post stent placement, pulmonary embolism on chronic Coumadin, chronic kidney disease stage III, abdominal insufficiency on cortisone replacement, hypothyroidism, solitary left kidney recently admitted from 9/8-9/12 for aspiration pneumonia presented with bright red blood per rectum for 2 days. She was found to have a supratherapeutic INR on admission. Evaluated by GI. Bleeding scan positive for active bleeding on September 20. Seen by IR. After discussion with the patient and family decided not to do embolization her invasive procedure.   Assessment & Plan:   # Lower GI bleed from sigmoid colon: Suspect diverticular in origin. Patient had last colonoscopy over 10 years ago. Nuclear medicine bleeding scan positive for active bleeding at the site of sigmoid colon. IR discussed with the patient and family and decided not to do angiographically R embolization due to risk of kidney damage and complications. -Patient reported last bowel movement at 1 AM, no bleeding since then. Denied nausea vomiting. -Monitor CBC, currently nothing by mouth. May try clear liquid diet if no bleeding for more than 12 hours as per GI. -Off Coumadin.  #History of pulmonary embolism twice on chronic Coumadin. Patient reported that he is on Coumadin for a long time. Supratherapeutic INR with bleeding on admission. Patient is high risk for bleeding with Coumadin. I will check Doppler ultrasound of lower extremities before deciding IVC filter. If Dopplers are negative patient may need IVC filter. I discussed with Dr.Sood from pulmonology. -Monitor INR. Patient received FFP and vitamin K on admission. - May benefit from aspirin when bleeding stopped  # Acute blood loss anemia due to GIB; monitor CBC. Received red  blood cell transfusion.  #Coronary artery disease: Continue Imdur, metoprolol  #Hypothyroidism: Continue Synthroid  #History of adrenal insufficiency: Continue Cortef and Florinef  #Dyslipidemia: Continue fenofibrate, pravastatin  #GERD: Continue Protonix  #History of oropharyngeal dysphagia: Recently admitted for aspiration pneumonia. May need dysphagia diet.  #Chronic kidney disease is stage III: Patient has lower serum creatinine level than prior levels. Unknown exact baseline. Monitor BMP. Avoid nephrotoxins.  #Hypertension: Blood pressure elevated. Continue Imdur, metoprolol. Resume home dose of amlodipine. Monitor blood pressure closely.  # Hypokalemia: replete KCL. Repeat lab in am  DVT prophylaxis: SCD Code Status: Full code Family Communication: No family at bedside Disposition Plan: Currently in a stepdown    Consultants:   GI  IR  Procedures: Bleeding scan Antimicrobials: None  Subjective: Seen and examined at bedside. Reported feeling better. No bowel movement since 1 AM last night. Denied headache, dizziness, nausea, vomiting, abdominal pain, chest pain or shortness of breath.  Objective: Vitals:   11/21/16 0700 11/21/16 0800 11/21/16 0900 11/21/16 0918  BP: (!) 179/85  (!) 168/78 (!) 168/78  Pulse: 89  73 71  Resp: (!) 24  20   Temp: 98 F (36.7 C) 98.3 F (36.8 C)    TempSrc:  Oral    SpO2: 95%  98%     Intake/Output Summary (Last 24 hours) at 11/21/16 1058 Last data filed at 11/21/16 0700  Gross per 24 hour  Intake             1364 ml  Output             1050 ml  Net              314 ml  There were no vitals filed for this visit.  Examination:  General exam: Appears calm and comfortable  Respiratory system: Clear to auscultation. Respiratory effort normal. No wheezing or crackle Cardiovascular system: S1 & S2 heard, RRR.  No pedal edema. Gastrointestinal system: Abdomen is nondistended, soft and nontender. Normal bowel sounds  heard. Central nervous system: Alert and oriented. No focal neurological deficits. Extremities: Symmetric 5 x 5 power. Skin: No rashes, lesions or ulcers Psychiatry: Judgement and insight appear normal. Mood & affect appropriate.     Data Reviewed: I have personally reviewed following labs and imaging studies  CBC:  Recent Labs Lab 11/20/16 0413 11/20/16 1913 11/21/16 0957  WBC 10.5 17.1* 9.7  HGB 9.6* 8.6* 8.7*  HCT 28.2* 25.8* 25.0*  MCV 90.1 92.1 89.0  PLT 347 344 053   Basic Metabolic Panel:  Recent Labs Lab 11/20/16 0413 11/21/16 0957  NA 143 144  K 3.2* 3.4*  CL 110 111  CO2 23 22  GLUCOSE 82 78  BUN 25* 29*  CREATININE 1.84* 1.72*  CALCIUM 8.3* 8.1*   GFR: Estimated Creatinine Clearance: 33.3 mL/min (A) (by C-G formula based on SCr of 1.72 mg/dL (H)). Liver Function Tests:  Recent Labs Lab 11/20/16 0413 11/21/16 0957  AST 19 25  ALT 13* 14*  ALKPHOS 47 47  BILITOT 0.6 2.2*  PROT 5.2* 5.2*  ALBUMIN 2.5* 2.6*   No results for input(s): LIPASE, AMYLASE in the last 168 hours. No results for input(s): AMMONIA in the last 168 hours. Coagulation Profile:  Recent Labs Lab 11/20/16 0413 11/20/16 1913  INR 4.18* 1.68   Cardiac Enzymes: No results for input(s): CKTOTAL, CKMB, CKMBINDEX, TROPONINI in the last 168 hours. BNP (last 3 results) No results for input(s): PROBNP in the last 8760 hours. HbA1C: No results for input(s): HGBA1C in the last 72 hours. CBG: No results for input(s): GLUCAP in the last 168 hours. Lipid Profile: No results for input(s): CHOL, HDL, LDLCALC, TRIG, CHOLHDL, LDLDIRECT in the last 72 hours. Thyroid Function Tests: No results for input(s): TSH, T4TOTAL, FREET4, T3FREE, THYROIDAB in the last 72 hours. Anemia Panel: No results for input(s): VITAMINB12, FOLATE, FERRITIN, TIBC, IRON, RETICCTPCT in the last 72 hours. Sepsis Labs: No results for input(s): PROCALCITON, LATICACIDVEN in the last 168 hours.  Recent Results  (from the past 240 hour(s))  MRSA PCR Screening     Status: None   Collection Time: 11/20/16  6:20 PM  Result Value Ref Range Status   MRSA by PCR NEGATIVE NEGATIVE Final    Comment:        The GeneXpert MRSA Assay (FDA approved for NASAL specimens only), is one component of a comprehensive MRSA colonization surveillance program. It is not intended to diagnose MRSA infection nor to guide or monitor treatment for MRSA infections.          Radiology Studies: Nm Gi Blood Loss  Addendum Date: 11/20/2016   ADDENDUM REPORT: 11/20/2016 20:00 ADDENDUM: I discussed these findings by telephone with Dr. Allyson Sabal at approximately 1830 hours on 11/20/2016. Electronically Signed   By: Misty Stanley M.D.   On: 11/20/2016 20:00   Result Date: 11/20/2016 CLINICAL DATA:  GI bleed intermittent for 2 days. EXAM: NUCLEAR MEDICINE GASTROINTESTINAL BLEEDING SCAN TECHNIQUE: Sequential abdominal images were obtained following intravenous administration of Tc-37m labeled red blood cells. RADIOPHARMACEUTICALS:  27.4 mCi Tc-77m in-vitro labeled red cells. COMPARISON:  CT scan from 04/10/ 2017 FINDINGS: There is an area of extravascular tracer accumulation in the lower abdomen/pelvis with  some linear retrograde movement during the exam. This collection appears to clear between the first and second hours of imaging and the second hour of planar imaging shows further accumulation of radiotracer in the same region, but to a greater degree. Some portions of the tracer accumulation on the second hour do clear but there is no gross migration of the apparent extra vascular radiotracer. Movement of the radiotracer is more apparent on the stacked cine images. Reviewing coronal imaging from the previous CT scan, this tracer accumulation is in the region of the patient's sigmoid colon and has a similar configuration. IMPRESSION: Imaging features are highly suspicious for active bleeding in the sigmoid colon. Electronically Signed:  By: Misty Stanley M.D. On: 11/20/2016 18:23        Scheduled Meds: . colesevelam  1,875 mg Oral BID WC  . fenofibrate  160 mg Oral Daily  . finasteride  5 mg Oral Daily  . fludrocortisone  0.1 mg Oral Daily  . hydrocortisone  10 mg Oral q morning - 10a  . hydrocortisone  5 mg Oral QPM  . isosorbide mononitrate  60 mg Oral Daily  . levothyroxine  25 mcg Oral QAC breakfast  . metoprolol succinate  25 mg Oral Daily  . pantoprazole  40 mg Oral Daily  . pravastatin  20 mg Oral Daily  . sertraline  50 mg Oral Daily   Continuous Infusions: . sodium chloride       LOS: 1 day    Kyona Chauncey Tanna Furry, MD [facility] Pager [phone]  If 7PM-7AM, please contact night-coverage [email or web address] Password [facility] 11/21/2016, 10:58 AM

## 2016-11-21 NOTE — Progress Notes (Signed)
VASCULAR LAB PRELIMINARY  PRELIMINARY  PRELIMINARY  PRELIMINARY  Bilateral lower extremity venous duplex completed.    Preliminary report:  Bilateral:  No evidence of DVT, superficial thrombosis, or Baker's Cyst.   Leonard Shepard, RVS 11/21/2016, 3:23 PM

## 2016-11-22 DIAGNOSIS — R933 Abnormal findings on diagnostic imaging of other parts of digestive tract: Secondary | ICD-10-CM

## 2016-11-22 DIAGNOSIS — I10 Essential (primary) hypertension: Secondary | ICD-10-CM

## 2016-11-22 DIAGNOSIS — R791 Abnormal coagulation profile: Secondary | ICD-10-CM

## 2016-11-22 LAB — CBC
HEMATOCRIT: 24.8 % — AB (ref 39.0–52.0)
HEMOGLOBIN: 8.6 g/dL — AB (ref 13.0–17.0)
MCH: 31.5 pg (ref 26.0–34.0)
MCHC: 34.7 g/dL (ref 30.0–36.0)
MCV: 90.8 fL (ref 78.0–100.0)
Platelets: 298 10*3/uL (ref 150–400)
RBC: 2.73 MIL/uL — AB (ref 4.22–5.81)
RDW: 15.3 % (ref 11.5–15.5)
WBC: 8.7 10*3/uL (ref 4.0–10.5)

## 2016-11-22 LAB — BASIC METABOLIC PANEL
ANION GAP: 8 (ref 5–15)
BUN: 26 mg/dL — ABNORMAL HIGH (ref 6–20)
CHLORIDE: 110 mmol/L (ref 101–111)
CO2: 25 mmol/L (ref 22–32)
Calcium: 8.2 mg/dL — ABNORMAL LOW (ref 8.9–10.3)
Creatinine, Ser: 1.68 mg/dL — ABNORMAL HIGH (ref 0.61–1.24)
GFR calc non Af Amer: 34 mL/min — ABNORMAL LOW (ref >60)
GFR, EST AFRICAN AMERICAN: 39 mL/min — AB (ref >60)
Glucose, Bld: 86 mg/dL (ref 65–99)
POTASSIUM: 3.8 mmol/L (ref 3.5–5.1)
SODIUM: 143 mmol/L (ref 135–145)

## 2016-11-22 LAB — PROTIME-INR
INR: 1.31
Prothrombin Time: 16.2 seconds — ABNORMAL HIGH (ref 11.4–15.2)

## 2016-11-22 MED ORDER — HYDRALAZINE HCL 20 MG/ML IJ SOLN
2.0000 mg | INTRAMUSCULAR | Status: DC | PRN
  Administered 2016-11-22: 2 mg via INTRAVENOUS
  Filled 2016-11-22: qty 1

## 2016-11-22 MED ORDER — HYDRALAZINE HCL 20 MG/ML IJ SOLN
5.0000 mg | INTRAMUSCULAR | Status: DC | PRN
  Administered 2016-11-23: 5 mg via INTRAVENOUS
  Filled 2016-11-22: qty 1

## 2016-11-22 MED ORDER — AMLODIPINE BESYLATE 5 MG PO TABS
5.0000 mg | ORAL_TABLET | Freq: Every day | ORAL | Status: DC
  Administered 2016-11-22 – 2016-11-23 (×2): 5 mg via ORAL
  Filled 2016-11-22 (×2): qty 1

## 2016-11-22 MED ORDER — DIPHENHYDRAMINE HCL 25 MG PO CAPS
25.0000 mg | ORAL_CAPSULE | Freq: Once | ORAL | Status: AC
  Administered 2016-11-22: 25 mg via ORAL
  Filled 2016-11-22: qty 1

## 2016-11-22 NOTE — Progress Notes (Signed)
PROGRESS NOTE    Leonard Shepard  EGB:[id number] DOB: 1922-08-12 DOA: 11/20/2016 PCP: Hennie Duos, MD   Brief Narrative: 83 year old male with history of obstructive lung disease, coronary artery disease status post stent placement, pulmonary embolism on chronic Coumadin, chronic kidney disease stage III, abdominal insufficiency on cortisone replacement, hypothyroidism, solitary left kidney recently admitted from 9/8-9/12 for aspiration pneumonia presented with bright red blood per rectum for 2 days. She was found to have a supratherapeutic INR on admission. Evaluated by GI. Bleeding scan positive for active bleeding on September 20. Seen by IR. After discussion with the patient and family decided not to do embolization her invasive procedure.   Assessment & Plan:   # Lower GI bleed from sigmoid colon: Suspect diverticular in origin. Patient had last colonoscopy over 10 years ago. Nuclear medicine bleeding scan positive for active bleeding at the site of sigmoid colon. IR discussed with the patient and family and decided not to do angiographically R embolization due to risk of kidney damage and complications. -Patient reported 1 bowel movement today with small amount of blood likely old blood. Hemoglobin is stable today. Advance diet to soft today. Repeat CBC in the morning. Off Coumadin. Transfer to medical floor.  #History of pulmonary embolism twice on chronic Coumadin. Patient reported that he is on Coumadin for a long time. Supratherapeutic INR with bleeding on admission. Patient is high risk for bleeding with Coumadin. I discussed with Dr. Halford Chessman from pulmonology. Doppler ultrasound of lower extremities negative for DVT. I have discussed with the patient at bedside at length regarding anticoagulation. He does not like to pursue any invasive test procedure. Plan to hold Coumadin. No IVC filter because patient does not have DVT and he is not willing to pursue any invasive test. - May benefit  from aspirin when bleeding stopped -Transfer out of the stepdown, PT OT evaluation.  # Acute blood loss anemia due to GIB; monitor CBC. Received red blood cell transfusion.  #Coronary artery disease: Continue Imdur, metoprolol  #Hypothyroidism: Continue Synthroid  #History of adrenal insufficiency: Continue Cortef and Florinef  #Dyslipidemia: Continue fenofibrate, pravastatin  #GERD: Continue Protonix  #History of oropharyngeal dysphagia: Recently admitted for aspiration pneumonia.   #Chronic kidney disease is stage III: Patient has lower serum creatinine level than prior levels. Unknown exact baseline. Monitor BMP. Avoid nephrotoxins.  #Hypertension: Blood pressure elevated. Continue Imdur, metoprolol. Increase the dose of amlodipine to 5 mg. Monitor blood pressure.  # Hypokalemia: replete KCL. A.m. labs showed improvement in serum potassium level.  DVT prophylaxis: SCD Code Status: Full code Family Communication: No family at bedside Disposition Plan: Transfer to medical floor from the stepdown unit.    Consultants:   GI  IR  Procedures: Bleeding scan Antimicrobials: None  Subjective: Seen and examined at bedside. Denied headache, dizziness, nausea vomiting or abdominal pain. Had 1 bowel movement this morning with a small amount of blood. He wants to go home on discharge.  Objective: Vitals:   11/22/16 0712 11/22/16 0900 11/22/16 1135 11/22/16 1200  BP:  (!) 188/75  (!) 122/54  Pulse:  64  65  Resp:  18  (!) 22  Temp: 98.6 F (37 C)  97.9 F (36.6 C)   TempSrc: Oral  Oral   SpO2:  96%  96%  Weight:  95.3 kg (210 lb)    Height:  6' (1.829 m)      Intake/Output Summary (Last 24 hours) at 11/22/16 1240 Last data filed at 11/22/16 0930  Gross per  24 hour  Intake              720 ml  Output             1275 ml  Net             -555 ml   Filed Weights   11/22/16 0900  Weight: 95.3 kg (210 lb)    Examination:  General exam: Not in distress    Respiratory system: Clear bilateral. Respiratory effort normal. No wheezing or crackle Cardiovascular system: Regular rate rhythm, S1 and S2 normal.  No pedal edema. Gastrointestinal system: Abdomen is soft, nondistended, nontender. Bowel sound positive Central nervous system: Alert awake and oriented. No focal neurological deficit.Marland Kitchen Extremities: Symmetric 5 x 5 power. Skin: No rashes, lesions or ulcers Psychiatry: Judgement and insight appear normal. Mood & affect appropriate..     Data Reviewed: I have personally reviewed following labs and imaging studies  CBC:  Recent Labs Lab 11/20/16 0413 11/20/16 1913 11/21/16 0957 11/22/16 0310  WBC 10.5 17.1* 9.7 8.7  HGB 9.6* 8.6* 8.7* 8.6*  HCT 28.2* 25.8* 25.0* 24.8*  MCV 90.1 92.1 89.0 90.8  PLT 347 344 293 263   Basic Metabolic Panel:  Recent Labs Lab 11/20/16 0413 11/21/16 0957 11/22/16 0310  NA 143 144 143  K 3.2* 3.4* 3.8  CL 110 111 110  CO2 23 22 25   GLUCOSE 82 78 86  BUN 25* 29* 26*  CREATININE 1.84* 1.72* 1.68*  CALCIUM 8.3* 8.1* 8.2*   GFR: Estimated Creatinine Clearance: 33.6 mL/min (A) (by C-G formula based on SCr of 1.68 mg/dL (H)). Liver Function Tests:  Recent Labs Lab 11/20/16 0413 11/21/16 0957  AST 19 25  ALT 13* 14*  ALKPHOS 47 47  BILITOT 0.6 2.2*  PROT 5.2* 5.2*  ALBUMIN 2.5* 2.6*   No results for input(s): LIPASE, AMYLASE in the last 168 hours. No results for input(s): AMMONIA in the last 168 hours. Coagulation Profile:  Recent Labs Lab 11/20/16 0413 11/20/16 1913 11/21/16 1438 11/22/16 0310  INR 4.18* 1.68 1.25 1.31   Cardiac Enzymes: No results for input(s): CKTOTAL, CKMB, CKMBINDEX, TROPONINI in the last 168 hours. BNP (last 3 results) No results for input(s): PROBNP in the last 8760 hours. HbA1C: No results for input(s): HGBA1C in the last 72 hours. CBG: No results for input(s): GLUCAP in the last 168 hours. Lipid Profile: No results for input(s): CHOL, HDL, LDLCALC,  TRIG, CHOLHDL, LDLDIRECT in the last 72 hours. Thyroid Function Tests: No results for input(s): TSH, T4TOTAL, FREET4, T3FREE, THYROIDAB in the last 72 hours. Anemia Panel: No results for input(s): VITAMINB12, FOLATE, FERRITIN, TIBC, IRON, RETICCTPCT in the last 72 hours. Sepsis Labs: No results for input(s): PROCALCITON, LATICACIDVEN in the last 168 hours.  Recent Results (from the past 240 hour(s))  MRSA PCR Screening     Status: None   Collection Time: 11/20/16  6:20 PM  Result Value Ref Range Status   MRSA by PCR NEGATIVE NEGATIVE Final    Comment:        The GeneXpert MRSA Assay (FDA approved for NASAL specimens only), is one component of a comprehensive MRSA colonization surveillance program. It is not intended to diagnose MRSA infection nor to guide or monitor treatment for MRSA infections.          Radiology Studies: Nm Gi Blood Loss  Addendum Date: 11/20/2016   ADDENDUM REPORT: 11/20/2016 20:00 ADDENDUM: I discussed these findings by telephone with Dr. Allyson Sabal at approximately [phone]  hours on 11/20/2016. Electronically Signed   By: Misty Stanley M.D.   On: 11/20/2016 20:00   Result Date: 11/20/2016 CLINICAL DATA:  GI bleed intermittent for 2 days. EXAM: NUCLEAR MEDICINE GASTROINTESTINAL BLEEDING SCAN TECHNIQUE: Sequential abdominal images were obtained following intravenous administration of Tc-32m labeled red blood cells. RADIOPHARMACEUTICALS:  27.4 mCi Tc-35m in-vitro labeled red cells. COMPARISON:  CT scan from 04/10/ 2017 FINDINGS: There is an area of extravascular tracer accumulation in the lower abdomen/pelvis with some linear retrograde movement during the exam. This collection appears to clear between the first and second hours of imaging and the second hour of planar imaging shows further accumulation of radiotracer in the same region, but to a greater degree. Some portions of the tracer accumulation on the second hour do clear but there is no gross migration of the  apparent extra vascular radiotracer. Movement of the radiotracer is more apparent on the stacked cine images. Reviewing coronal imaging from the previous CT scan, this tracer accumulation is in the region of the patient's sigmoid colon and has a similar configuration. IMPRESSION: Imaging features are highly suspicious for active bleeding in the sigmoid colon. Electronically Signed: By: Misty Stanley M.D. On: 11/20/2016 18:23        Scheduled Meds: . amLODipine  5 mg Oral Daily  . colesevelam  1,875 mg Oral BID WC  . fenofibrate  160 mg Oral Daily  . finasteride  5 mg Oral Daily  . fludrocortisone  0.1 mg Oral Daily  . hydrocortisone  10 mg Oral q morning - 10a  . hydrocortisone  5 mg Oral QPM  . isosorbide mononitrate  60 mg Oral Daily  . levothyroxine  25 mcg Oral QAC breakfast  . metoprolol succinate  25 mg Oral Daily  . pantoprazole  40 mg Oral Daily  . pravastatin  20 mg Oral Daily  . sertraline  50 mg Oral Daily   Continuous Infusions: . sodium chloride       LOS: 2 days    Devere Brem Tanna Furry, MD [facility] Pager [phone]  If 7PM-7AM, please contact night-coverage [email or web address] Password [facility] 11/22/2016, 12:40 PM

## 2016-11-22 NOTE — Progress Notes (Signed)
[facility] Progress Note  CC:  GI bleed  Subjective:  Feels good.  Eat breakfast.  No further sign of active bleeding, had a BM that was rusty colored, likely old blood.  Objective:  Vital signs in last 24 hours: Temp:  [97.4 F (36.3 C)-98.6 F (37 C)] 98.6 F (37 C) (09/22 0712) Pulse Rate:  [53-73] 54 (09/22 0634) Resp:  [13-22] 18 (09/22 0634) BP: (136-191)/(50-83) 181/75 (09/22 0600) SpO2:  [91 %-99 %] 96 % (09/22 0634) Last BM Date: 11/20/16 General:  Alert, Well-developed, in NAD Heart:  Regular rate and rhythm; no murmurs Pulm:  CTAB.  No increased WOB. Abdomen:  Soft, non-distended. Normal bowel sounds.  Non-tender. Extremities:  Without edema. Neurologic:  Alert and oriented x 4;  grossly normal neurologically. Psych:  Alert and cooperative. Normal mood and affect.  Intake/Output from previous day: 09/21 0701 - 09/22 0700 In: 360 [P.O.:360] Out: 1275 [Urine:1275]  Lab Results:  Recent Labs  11/20/16 1913 11/21/16 0957 11/22/16 0310  WBC 17.1* 9.7 8.7  HGB 8.6* 8.7* 8.6*  HCT 25.8* 25.0* 24.8*  PLT 344 293 298   BMET  Recent Labs  11/20/16 0413 11/21/16 0957 11/22/16 0310  NA 143 144 143  K 3.2* 3.4* 3.8  CL 110 111 110  CO2 23 22 25   GLUCOSE 82 78 86  BUN 25* 29* 26*  CREATININE 1.84* 1.72* 1.68*  CALCIUM 8.3* 8.1* 8.2*   LFT  Recent Labs  11/21/16 0957  PROT 5.2*  ALBUMIN 2.6*  AST 25  ALT 14*  ALKPHOS 47  BILITOT 2.2*   PT/INR  Recent Labs  11/21/16 1438 11/22/16 0310  LABPROT 15.6* 16.2*  INR 1.25 1.31   Nm Gi Blood Loss  Addendum Date: 11/20/2016   ADDENDUM REPORT: 11/20/2016 20:00 ADDENDUM: I discussed these findings by telephone with Dr. Allyson Sabal at approximately 1830 hours on 11/20/2016. Electronically Signed   By: Misty Stanley M.D.   On: 11/20/2016 20:00   Result Date: 11/20/2016 CLINICAL DATA:  GI bleed intermittent for 2 days. EXAM: NUCLEAR MEDICINE GASTROINTESTINAL BLEEDING SCAN TECHNIQUE:  Sequential abdominal images were obtained following intravenous administration of Tc-50m labeled red blood cells. RADIOPHARMACEUTICALS:  27.4 mCi Tc-21m in-vitro labeled red cells. COMPARISON:  CT scan from 04/10/ 2017 FINDINGS: There is an area of extravascular tracer accumulation in the lower abdomen/pelvis with some linear retrograde movement during the exam. This collection appears to clear between the first and second hours of imaging and the second hour of planar imaging shows further accumulation of radiotracer in the same region, but to a greater degree. Some portions of the tracer accumulation on the second hour do clear but there is no gross migration of the apparent extra vascular radiotracer. Movement of the radiotracer is more apparent on the stacked cine images. Reviewing coronal imaging from the previous CT scan, this tracer accumulation is in the region of the patient's sigmoid colon and has a similar configuration. IMPRESSION: Imaging features are highly suspicious for active bleeding in the sigmoid colon. Electronically Signed: By: Misty Stanley M.D. On: 11/20/2016 18:23   Assessment / Plan: -LGIB: Suspect diverticular in origin, but cancer and other etiologiescannot be excluded. Last colonoscopy over 10 years ago.  NM bleeding scan positive for area of sigmoid colon.  IR discussed with patient and family, and they decided against angiography with embolization for now due to risk of vascular injury and kidney damage.   -Acute blood loss anemia on top of anemia or  chronic disease:  Hgb stable this AM. -History of PE, on coumadin at home.  ? To resume on discharge vs IVC filter or other options. -Supratherapeutic INR: 4.18 on admission.  Was given Vitamin K and FFP.  INR 1.31 this AM.  *Continue to monitor Hgb closely. *If patient continues to bleed significantly then IR may need to be contacted and have discussion to reconsider intervention. *? To resume on discharge vs IVC filter or  other options.    LOS: 2 days   Shelitha Magley D.  11/22/2016, 8:49 AM  Pager number [phone]

## 2016-11-23 DIAGNOSIS — E039 Hypothyroidism, unspecified: Secondary | ICD-10-CM

## 2016-11-23 DIAGNOSIS — Z9229 Personal history of other drug therapy: Secondary | ICD-10-CM

## 2016-11-23 DIAGNOSIS — D62 Acute posthemorrhagic anemia: Secondary | ICD-10-CM

## 2016-11-23 DIAGNOSIS — K922 Gastrointestinal hemorrhage, unspecified: Secondary | ICD-10-CM

## 2016-11-23 DIAGNOSIS — D689 Coagulation defect, unspecified: Secondary | ICD-10-CM

## 2016-11-23 LAB — CBC
HEMATOCRIT: 25.2 % — AB (ref 39.0–52.0)
HEMOGLOBIN: 8.7 g/dL — AB (ref 13.0–17.0)
MCH: 31.8 pg (ref 26.0–34.0)
MCHC: 34.5 g/dL (ref 30.0–36.0)
MCV: 92 fL (ref 78.0–100.0)
Platelets: 310 10*3/uL (ref 150–400)
RBC: 2.74 MIL/uL — AB (ref 4.22–5.81)
RDW: 15.4 % (ref 11.5–15.5)
WBC: 8.4 10*3/uL (ref 4.0–10.5)

## 2016-11-23 LAB — PROTIME-INR
INR: 1.28
PROTHROMBIN TIME: 15.9 s — AB (ref 11.4–15.2)

## 2016-11-23 MED ORDER — CLONIDINE HCL 0.1 MG PO TABS
0.1000 mg | ORAL_TABLET | Freq: Once | ORAL | Status: AC
  Administered 2016-11-23: 0.1 mg via ORAL
  Filled 2016-11-23: qty 1

## 2016-11-23 NOTE — Progress Notes (Signed)
1st attempt to call report to [facility]. Unable to talk to RN.

## 2016-11-23 NOTE — Clinical Social Work Note (Signed)
Clinical Social Work Assessment  Patient Details  Name: Leonard Shepard MRN: [id number] Date of Birth: [birth year]  Date of referral:  11/20/16               Reason for consult:  Facility Placement, Discharge Planning                Permission sought to share information with:  Chartered certified accountant granted to share information::  Yes, Verbal Permission Granted  Name::     Diplomatic Services operational officer::  [facility]  Relationship::  son  Contact Information:     Housing/Transportation Living arrangements for the past 2 months:  [place], [place] of Information:  Adult Children Leonard Shepard. ) Patient Interpreter Needed:  None Criminal Activity/Legal Involvement Pertinent to Current Situation/Hospitalization:    Significant Relationships:  Adult Children Lives with:  Adult Children Do you feel safe going back to the place where you live?  Yes Need for family participation in patient care:  Yes (Comment)  Care giving concerns:  Pt from [facility] and will return to [facility] for short term rehab.  Social Worker assessment / plan:  CSW f/u with facilitating transition back to SNF. CSW spoke with admissions and they are in agreement. CSW will send DC summary once available and set up transport thereafter.  Employment status:  Retired Forensic scientist:  Medicare PT Recommendations:  Not assessed at this time Information / Referral to community resources:     Patient/Family's Response to care:  Psychologist, prison and probation services of CSW assistance with transition back to SNF. No issues or concerns identified.  Patient/Family's Understanding of and Emotional Response to Diagnosis, Current Treatment, and Prognosis:  Patient/family has good understanding of diagnosis, current treatment and prognosis. Pt returning to SNF to continue skilled nursing needs. No issues or concerns identified at this time.  Emotional Assessment Appearance:  Appears stated  age Attitude/Demeanor/Rapport:   (Cooperative) Affect (typically observed):  Accepting, Appropriate Orientation:  Oriented to Self, Oriented to Place, Oriented to  Time, Oriented to Situation Alcohol / Substance use:  Not Applicable Psych involvement (Current and /or in the community):  No (Comment)  Discharge Needs  Concerns to be addressed:  No discharge needs identified Readmission within the last 30 days:  Yes Current discharge risk:  None Barriers to Discharge:  No Barriers Identified   Normajean Baxter, LCSW 11/23/2016, 4:05 PM

## 2016-11-23 NOTE — Social Work (Addendum)
Clinical Social Worker facilitated patient discharge including contacting patient family and facility to confirm patient discharge plans.  Clinical information faxed to facility and family agreeable with plan.    CSW arranged ambulance transport via [facility] to [facility].    RN to call [phone] to give report prior to discharge.  Clinical Social Worker will sign off for now as social work intervention is no longer needed. Please consult [place] again if new need arises.  Elissa Hefty, LCSW Clinical Social Worker [phone]

## 2016-11-23 NOTE — Progress Notes (Signed)
     [facility] Progress Note  CC:  GI bleed  Subjective:  Sleepy this AM.  Last BM was yesterday.  Hgb is stable.  PT is going to see the patient today.  He would like to go home with home PT but he was admitted from rehab facility.  Refuses to go back to the same rehab facility.  Objective:  Vital signs in last 24 hours: Temp:  [97.9 F (36.6 C)-98.5 F (36.9 C)] 98.3 F (36.8 C) (09/23 0800) Pulse Rate:  [50-65] 50 (09/23 0400) Resp:  [11-22] 13 (09/23 0400) BP: (122-181)/(54-85) 156/71 (09/23 0400) SpO2:  [91 %-97 %] 91 % (09/23 0400) Last BM Date: 11/21/16 General:  Alert, Well-developed, in NAD Heart:  Slightly bradycardic; no murmurs Pulm:  CTAB.  No increased WOB. Abdomen:  Soft, non-distended.  BS present.  Non-tender. Extremities:  Without edema. Neurologic:  Alert and oriented x 4;  grossly normal neurologically. Psych:  Alert and cooperative. Normal mood and affect.  Intake/Output from previous day: 09/22 0701 - 09/23 0700 In: 360 [P.O.:360] Out: 1350 [Urine:1350]  Lab Results:  Recent Labs  11/21/16 0957 11/22/16 0310 11/23/16 0339  WBC 9.7 8.7 8.4  HGB 8.7* 8.6* 8.7*  HCT 25.0* 24.8* 25.2*  PLT 293 298 310   BMET  Recent Labs  11/21/16 0957 11/22/16 0310  NA 144 143  K 3.4* 3.8  CL 111 110  CO2 22 25  GLUCOSE 78 86  BUN 29* 26*  CREATININE 1.72* 1.68*  CALCIUM 8.1* 8.2*   LFT  Recent Labs  11/21/16 0957  PROT 5.2*  ALBUMIN 2.6*  AST 25  ALT 14*  ALKPHOS 47  BILITOT 2.2*   PT/INR  Recent Labs  11/22/16 0310 11/23/16 0339  LABPROT 16.2* 15.9*  INR 1.31 1.28   Assessment / Plan: -LGIB:  Highly suspect diverticular in origin, but cancer and other etiologiescannot be excluded. Last colonoscopy over 10 years ago. NM bleeding scan positive for area of sigmoid colon. IR discussed with patient and family, and they decided against angiography with embolization for now due to risk of vascular injury and kidney  damage.  -Acute blood loss anemia on top of anemia or chronic disease: Hgb stable this AM. -History of PE, on coumadin at home. ? To resume in 1-2 weeks vs IVC filter vs do nothing. -Supratherapeutic INR: 4.18 on admission. Was givenVitamin K and FFP. INR 1.28 this AM.  *Patient understands the risks and benefits to each option regarding coumadin, IVC filter, or doing neither.  He needs to make a decision on what he would like to do. *Patient ok for discharge from a GI standpoint.    LOS: 3 days   Elanora Quin D.  11/23/2016, 9:00 AM  Pager number [phone]

## 2016-11-23 NOTE — Clinical Social Work Placement (Signed)
   CLINICAL SOCIAL WORK PLACEMENT  NOTE  Date:  11/23/2016  Patient Details  Name: Leonard Shepard MRN: [id number] Date of Birth: 11-10-1922  Clinical Social Work is seeking post-discharge placement for this patient at the [place] level of care (*CSW will initial, date and re-position this form in  chart as items are completed):  Yes   Patient/family provided with [place] Work Department's list of facilities offering this level of care within the geographic area requested by the patient (or if unable, by the patient's family).  Yes   Patient/family informed of their freedom to choose among providers that offer the needed level of care, that participate in Medicare, Medicaid or managed care program needed by the patient, have an available bed and are willing to accept the patient.  Yes   Patient/family informed of 's ownership interest in [facility] and [facility], as well as of the fact that they are under no obligation to receive care at these facilities.  PASRR submitted to EDS on       PASRR number received on       Existing PASRR number confirmed on 11/23/16     FL2 transmitted to all facilities in geographic area requested by pt/family on       FL2 transmitted to all facilities within larger geographic area on 11/23/16     Patient informed that his/her managed care company has contracts with or will negotiate with certain facilities, including the following:        Yes   Patient/family informed of bed offers received.  Patient chooses bed at [facility]     Physician recommends and patient chooses bed at      Patient to be transferred to [facility] on 11/23/16.  Patient to be transferred to facility by [facility]     Patient family notified on 11/23/16 of transfer.  Name of family member notified:  son Derran at bedside     PHYSICIAN Please prepare prescriptions     Additional  Comment:    _______________________________________________ Normajean Baxter, LCSW 11/23/2016, 4:12 PM

## 2016-11-23 NOTE — NC FL2 (Signed)
[place] LEVEL OF CARE SCREENING TOOL     IDENTIFICATION  Patient Name: Leonard Shepard Birthdate: 09/21/1922 Sex: male Admission Date (Current Location): 11/20/2016  [facility] and [place] Number:  Herbalist and Address:  [facility] [place], [place] [address], [place], [place] [address]      Provider Number: [id number]  Attending Physician Name and Address:  Donne Hazel, MD  Relative Name and Phone Number:  [name], son ,[phone]    Current Level of Care: Hospital Recommended Level of Care: [place] Prior Approval Number:    Date Approved/Denied: 11/23/16 PASRR Number: [id number]  Discharge Plan: SNF    Current Diagnoses: Patient Active Problem List   Diagnosis Date Noted  . HX: anticoagulation   . Anemia due to acute blood loss   . Diverticulitis of colon with bleeding   . Coagulopathy ([place])   . Lower GI bleed 11/20/2016  . Dysphasia 11/16/2016  . Acute renal failure superimposed on stage 4 chronic kidney disease ([place]) 11/16/2016  . Adrenal insufficiency due to steroid withdrawal ([place]) 11/16/2016  . Hypertension 11/16/2016  . Abdominal aortic atherosclerosis ([place]) 11/10/2016  . Oropharyngeal dysphagia 11/10/2016  . Hemoptysis   . Chronic anticoagulation   . S/P coronary artery stent placement   . Acquired hypothyroidism   . History of adrenal insufficiency   . Postinflammatory pulmonary fibrosis ([place])   . H/O unilateral nephrectomy   . Hx pulmonary embolism   . Aspiration pneumonia ([place]) 11/08/2016  . Orthostasis 06/11/2015  . Sepsis ([place]) 06/11/2015  . Nausea & vomiting 06/11/2015  . Abdominal pain 06/11/2015  . HLD (hyperlipidemia)   . GERD (gastroesophageal reflux disease)   . CAD (coronary artery disease)   . PE (pulmonary embolism)   . BPH (benign prostatic hyperplasia)   . Chronic renal insufficiency, stage III (moderate)   . COPD (chronic obstructive pulmonary disease) ([place])   .  Depression     Orientation RESPIRATION BLADDER Height & Weight     Self, Time, Situation, Place  Normal External catheter Weight: 210 lb (95.3 kg) Height:  6' (182.9 cm)  BEHAVIORAL SYMPTOMS/MOOD NEUROLOGICAL BOWEL NUTRITION STATUS      Continent Diet (Please see DC Summary)  AMBULATORY STATUS COMMUNICATION OF NEEDS Skin   Extensive Assist Verbally Normal                       Personal Care Assistance Level of Assistance  Bathing, Feeding, Dressing Bathing Assistance: Maximum assistance Feeding assistance: Maximum assistance Dressing Assistance: Maximum assistance     Functional Limitations Info  Sight, Hearing Sight Info: Impaired Hearing Info: Impaired      SPECIAL CARE FACTORS FREQUENCY                       Contractures      Additional Factors Info  Code Status, Allergies, Psychotropic Code Status Info: Full Code Allergies Info: ACLIDINIUM BROMIDE  Psychotropic Info: Zoloft         Current Medications (11/23/2016):  This is the current hospital active medication list Current Facility-Administered Medications  Medication Dose Route Frequency Provider Last Rate Last Dose  . 0.9 %  sodium chloride infusion   Intravenous Once Reyne Dumas, MD      . acetaminophen (TYLENOL) tablet 650 mg  650 mg Oral Q6H PRN Reyne Dumas, MD       Or  . acetaminophen (TYLENOL) suppository 650 mg  650 mg Rectal  Q6H PRN Reyne Dumas, MD      . acetaminophen (TYLENOL) tablet 650 mg  650 mg Oral Q6H PRN Schorr, Rhetta Mura, NP      . amLODipine (NORVASC) tablet 5 mg  5 mg Oral Daily Rosita Fire, MD   5 mg at 11/23/16 1100  . colesevelam [facility]) tablet 1,875 mg  1,875 mg Oral BID WC Reyne Dumas, MD   1,875 mg at 11/23/16 0806  . fenofibrate tablet 160 mg  160 mg Oral Daily Reyne Dumas, MD   160 mg at 11/23/16 1100  . finasteride (PROSCAR) tablet 5 mg  5 mg Oral Daily Reyne Dumas, MD   5 mg at 11/23/16 1100  . fludrocortisone (FLORINEF) tablet 0.1 mg  0.1  mg Oral Daily Reyne Dumas, MD   0.1 mg at 11/23/16 1100  . hydrALAZINE (APRESOLINE) injection 5 mg  5 mg Intravenous Q4H PRN Rosita Fire, MD   5 mg at 11/23/16 0006  . hydrocortisone (ANUSOL-HC) suppository 25 mg  25 mg Rectal TID PRN Schorr, Rhetta Mura, NP      . hydrocortisone (CORTEF) tablet 10 mg  10 mg Oral q morning - 10a Reyne Dumas, MD   10 mg at 11/23/16 1100  . hydrocortisone (CORTEF) tablet 5 mg  5 mg Oral QPM Reyne Dumas, MD   5 mg at 11/22/16 1850  . isosorbide mononitrate (IMDUR) 24 hr tablet 60 mg  60 mg Oral Daily Reyne Dumas, MD   60 mg at 11/23/16 1100  . levalbuterol (XOPENEX) nebulizer solution 0.63 mg  0.63 mg Nebulization Q6H PRN Reyne Dumas, MD      . levothyroxine (SYNTHROID, LEVOTHROID) tablet 25 mcg  25 mcg Oral QAC breakfast Reyne Dumas, MD   25 mcg at 11/23/16 0806  . metoprolol succinate (TOPROL-XL) 24 hr tablet 25 mg  25 mg Oral Daily Reyne Dumas, MD   25 mg at 11/23/16 1100  . ondansetron (ZOFRAN) tablet 4 mg  4 mg Oral Q6H PRN Reyne Dumas, MD       Or  . ondansetron (ZOFRAN) injection 4 mg  4 mg Intravenous Q6H PRN Abrol, Ascencion Dike, MD      . pantoprazole (PROTONIX) EC tablet 40 mg  40 mg Oral Daily Reyne Dumas, MD   40 mg at 11/23/16 1100  . pravastatin (PRAVACHOL) tablet 20 mg  20 mg Oral Daily Reyne Dumas, MD   20 mg at 11/23/16 1100  . sertraline (ZOLOFT) tablet 50 mg  50 mg Oral Daily Reyne Dumas, MD   50 mg at 11/23/16 1100     Discharge Medications: Please see discharge summary for a list of discharge medications.  Relevant Imaging Results:  Relevant Lab Results:   Additional Information SSN: [place], LCSW

## 2016-11-23 NOTE — Progress Notes (Signed)
2nd attempt to call report to [facility]. Left a number for RN to call me back. Unable to speak with RN that will receive pt.

## 2016-11-23 NOTE — Discharge Summary (Signed)
Physician Discharge Summary  Karla Vines VOZ:[id number] DOB: 1922-08-02 DOA: 11/20/2016  PCP: Hennie Duos, MD  Admit date: 11/20/2016 Discharge date: 11/23/2016  Admitted From: [facility] Disposition:  [facility]  Recommendations for Outpatient Follow-up:  1. Follow up with PCP in 1-2 weeks 2. Please obtain CBC in one week 3. Please resume coumadin in 1-2 weeks vs IVC filter placement if patient desires. Patient to decide if he wants filter placement as outpatient  Discharge Condition:Stable CODE STATUS:Full Diet recommendation: Soft, advance as tolerated   Brief/Interim Summary: 83 year old male with history of obstructive lung disease, coronary artery disease status post stent placement, pulmonary embolism on chronic Coumadin, chronic kidney disease stage III, abdominal insufficiency on cortisone replacement, hypothyroidism, solitary left kidney recently admitted from 9/8-9/12 for aspiration pneumonia presented with bright red blood per rectum for 2 days. She was found to have a supratherapeutic INR on admission. Evaluated by GI. Bleeding scan positive for active bleeding on September 20. Seen by IR. After discussion with the patient and family decided not to do embolization her invasive procedure.   # Lower GI bleed from sigmoid colon: Suspect diverticular in origin. Patient had last colonoscopy over 10 years ago. Nuclear medicine bleeding scan positive for active bleeding at the site of sigmoid colon. IR discussed with the patient and family and decided not to do angiographically R embolization due to risk of kidney damage and complications. -Hgb stable thus far -Diet advanced -Patient to resume coumadin in 1-2 weeks vs outpatient referral for IVC placement. Patient to consider these options and notify primary MD within 2 weeks  #History of pulmonary embolism twice on chronic Coumadin. Patient reported that he is on Coumadin for a long time. Supratherapeutic INR with bleeding on  admission. Patient is high risk for bleeding with Coumadin. Dr. Carolin Sicks discussed with Dr. Halford Chessman from pulmonology. Doppler ultrasound of lower extremities negative for DVT. Coumadin on hold per above.  # Acute blood loss anemia due to GIB; monitor CBC. Received red blood cell transfusion. Hgb stable. Labs reviewed  #Coronary artery disease: Continue Imdur, metoprolol. Chest pain free at present  #Hypothyroidism: Continue Synthroid. Stable currently  #History of adrenal insufficiency: Continue Cortef and Florinef. Currently stable  #Dyslipidemia: Continue fenofibrate, pravastatin. Presently stable  #GERD: Continued Protonix  #History of oropharyngeal dysphagia: Recently admitted for aspiration pneumonia.   #Chronic kidney disease is stage III: Patient has lower serum creatinine level than prior levels. Unknown exact baseline. Monitor BMP. Renal function stable.  #Hypertension: Blood pressure elevated. Continue Imdur, metoprolol. Recently increased the dose of amlodipine to 5 mg. BP remained stable  # Hypokalemia: replete KCL. Corrected. Labs reviewed  Discharge Diagnoses:  Principal Problem:   Lower GI bleed Active Problems:   Orthostasis   HLD (hyperlipidemia)   GERD (gastroesophageal reflux disease)   CAD (coronary artery disease)   S/P coronary artery stent placement   Acquired hypothyroidism   History of adrenal insufficiency   Hx pulmonary embolism   Hypertension   Anemia due to acute blood loss   Diverticulitis of colon with bleeding   Coagulopathy (HCC)   HX: anticoagulation    Discharge Instructions   Allergies as of 11/23/2016      Reactions   Aclidinium Bromide Itching, Other (See Comments)   Throat irritation      Medication List    STOP taking these medications   warfarin 4 MG tablet Commonly known as:  COUMADIN     TAKE these medications   amLODipine 2.5 MG tablet Commonly  known as:  NORVASC Take 2.5 mg by mouth daily.   colesevelam  625 MG tablet Commonly known as:  WELCHOL Take 1,875 mg by mouth 2 (two) times daily with a meal.   diphenhydramine-acetaminophen 25-500 MG Tabs tablet Commonly known as:  TYLENOL PM Take 2 tablets by mouth at bedtime.   fenofibrate 160 MG tablet Take 160 mg by mouth daily.   finasteride 5 MG tablet Commonly known as:  PROSCAR Take 5 mg by mouth daily.   fludrocortisone 0.1 MG tablet Commonly known as:  FLORINEF Take 0.1 mg by mouth daily.   fluticasone 50 MCG/ACT nasal spray Commonly known as:  FLONASE Place 1 spray into both nostrils daily.   furosemide 20 MG tablet Commonly known as:  LASIX Take 20 mg by mouth daily as needed for fluid.   guaifenesin 100 MG/5ML syrup Commonly known as:  ROBITUSSIN Take 200 mg by mouth every 6 (six) hours as needed for cough.   hydrocortisone 10 MG tablet Commonly known as:  CORTEF Take 1 tablet in the morning and half a tablet at night.   hydrocortisone 25 MG suppository Commonly known as:  ANUSOL-HC Place 25 mg rectally 3 (three) times daily. For 2 weeks   isosorbide mononitrate 60 MG 24 hr tablet Commonly known as:  IMDUR Take 60 mg by mouth daily.   levothyroxine 25 MCG tablet Commonly known as:  SYNTHROID, LEVOTHROID Take 25 mcg by mouth daily before breakfast.   metoprolol succinate 25 MG 24 hr tablet Commonly known as:  TOPROL-XL Take 25 mg by mouth daily.   ondansetron 4 MG tablet Commonly known as:  ZOFRAN Take 4 mg by mouth every 8 (eight) hours as needed for nausea or vomiting.   pantoprazole 40 MG tablet Commonly known as:  PROTONIX Take 40 mg by mouth daily.   pravastatin 20 MG tablet Commonly known as:  PRAVACHOL Take 20 mg by mouth daily.   sertraline 50 MG tablet Commonly known as:  ZOLOFT Take 50 mg by mouth daily.   SLOW-MAG PO Take 1 tablet by mouth daily.      Follow-up Information    Hennie Duos, MD. Schedule an appointment as soon as possible for a visit in 1 week(s).   Specialty:   Internal Medicine Contact information: [place] [id number] [phone]          Allergies  Allergen Reactions  . Aclidinium Bromide Itching and Other (See Comments)    Throat irritation    Consultations:  GI  Procedures/Studies: Ct Abdomen Pelvis Wo Contrast  Result Date: 11/08/2016 CLINICAL DATA:  Patient with vomiting.  Back pain. EXAM: CT ABDOMEN AND PELVIS WITHOUT CONTRAST TECHNIQUE: Multidetector CT imaging of the abdomen and pelvis was performed following the standard protocol without IV contrast. COMPARISON:  CT abdomen pelvis 06/11/2015 FINDINGS: Lower chest: Heart is mildly enlarged. Coronary arterial vascular calcifications. Interval progression of subpleural reticular opacities with associated architectural distortion. No pleural effusion. Hepatobiliary: Liver is normal in size and contour. Status post cholecystectomy. Pancreas: Unremarkable. Spleen: Unremarkable Adrenals/Urinary Tract: The adrenal glands are normal. Right kidney is surgically absent. Stable appearance of the surgical bed. Stable appearance of the left kidney. No hydronephrosis. Mild urinary bladder wall thickening. Stomach/Bowel: Large amount of stool within the rectum. Descending and sigmoid colonic diverticulosis. No CT evidence for acute diverticulitis. Vascular/Lymphatic: Peripheral calcified atherosclerotic plaque involving the abdominal aorta. Infrarenal abdominal aortic ectasia measuring 3.8 cm, previously 3.5 cm. No retroperitoneal lymphadenopathy. Reproductive: Prostate unremarkable. Other: None.  Musculoskeletal: Thoracic and lumbar spine degenerative changes. No aggressive or acute appearing osseous lesions. IMPRESSION: Large amount of stool within the rectum compatible with constipation. Descending and sigmoid colonic diverticulosis without CT evidence for acute diverticulitis. Infrarenal abdominal aortic aneurysm, slightly increased from prior measuring 3.8 cm. Recommend followup by  ultrasound in 2 years. This recommendation follows ACR consensus guidelines: White Paper of the ACR Incidental Findings Committee II on Vascular Findings. J Am Coll Radiol 2013; 10:789-794. Interval progression of subpleural reticular opacities and architectural distortion most compatible with progressed pulmonary fibrotic change. In the nonacute setting, recommend further evaluation with high-resolution chest CT. Mild urinary bladder wall thickening. Recommend correlation with urinalysis as infection not excluded. Electronically Signed   By: Lovey Newcomer M.D.   On: 11/08/2016 13:09   Dg Chest 2 View  Result Date: 11/10/2016 CLINICAL DATA:  Increase shortness of breath, pneumonia, history of COPD, coronary artery disease with stent placement, previous pulmonary embolism. EXAM: CHEST  2 VIEW COMPARISON:  Portable chest x-ray of November 09, 2016 FINDINGS: The lungs are adequately inflated. The interstitial markings remain increased with areas of patchy confluence in the right mid lung and the left lung base. Pair radius small left pleural effusion. The cardiac silhouette remains enlarged. The central pulmonary vascularity is prominent. There is calcification in the wall of the thoracic aorta. The observed bony thorax exhibits no acute abnormality. IMPRESSION: Persistent increased lung markings as described worrisome for interstitial pneumonia. Underlying low-grade compensated CHF may be present. Overall there has not been significant interval change in the appearance of the chest since yesterday's study. Electronically Signed   By: David  Martinique M.D.   On: 11/10/2016 10:15   Nm Gi Blood Loss  Addendum Date: 11/20/2016   ADDENDUM REPORT: 11/20/2016 20:00 ADDENDUM: I discussed these findings by telephone with Dr. Allyson Sabal at approximately 1830 hours on 11/20/2016. Electronically Signed   By: Misty Stanley M.D.   On: 11/20/2016 20:00   Result Date: 11/20/2016 CLINICAL DATA:  GI bleed intermittent for 2 days. EXAM:  NUCLEAR MEDICINE GASTROINTESTINAL BLEEDING SCAN TECHNIQUE: Sequential abdominal images were obtained following intravenous administration of Tc-45m labeled red blood cells. RADIOPHARMACEUTICALS:  27.4 mCi Tc-56m in-vitro labeled red cells. COMPARISON:  CT scan from 04/10/ 2017 FINDINGS: There is an area of extravascular tracer accumulation in the lower abdomen/pelvis with some linear retrograde movement during the exam. This collection appears to clear between the first and second hours of imaging and the second hour of planar imaging shows further accumulation of radiotracer in the same region, but to a greater degree. Some portions of the tracer accumulation on the second hour do clear but there is no gross migration of the apparent extra vascular radiotracer. Movement of the radiotracer is more apparent on the stacked cine images. Reviewing coronal imaging from the previous CT scan, this tracer accumulation is in the region of the patient's sigmoid colon and has a similar configuration. IMPRESSION: Imaging features are highly suspicious for active bleeding in the sigmoid colon. Electronically Signed: By: Misty Stanley M.D. On: 11/20/2016 18:23   US Renal  Result Date: 11/09/2016 CLINICAL DATA:  Patient with oliguria and an urea. Status post right renal removal. EXAM: RENAL / URINARY TRACT ULTRASOUND COMPLETE COMPARISON:  None. FINDINGS: Right Kidney: Surgically absent. Left Kidney: Length: 14.7 cm. Normal renal cortical thickness and echogenicity. Mild cortical lobulation. No hydronephrosis. Bladder: Decompressed with Foley catheter. IMPRESSION: No hydronephrosis. Electronically Signed   By: Lovey Newcomer M.D.   On: 11/09/2016 10:52  Nm Pulmonary Vent And Perf (v/q Scan)  Result Date: 11/08/2016 CLINICAL DATA:  Evaluate for pulmonary embolism EXAM: NUCLEAR MEDICINE VENTILATION - PERFUSION LUNG SCAN TECHNIQUE: Ventilation images were obtained in multiple projections using inhaled aerosol Tc-62m DTPA.  Perfusion images were obtained in multiple projections after intravenous injection of Tc-54m MAA. RADIOPHARMACEUTICALS:  29.6 mCi Technetium-52m DTPA aerosol inhalation and 4.1 mCi Technetium-42m MAA IV COMPARISON:  Chest radiograph - earlier same day ; 06/10/2015 FINDINGS: Review of chest radiograph performed earlier today demonstrates an enlarged cardiac silhouette and mediastinal contours. Interval development of ill-defined heterogeneous airspace opacities within the right mid and lower lung worrisome for areas of infection. Pulmonary vasculature appears indistinct with cephalization of flow. Small bilateral effusions are not excluded. Ventilation: There is clumping of inhaled radiotracer about the bilateral pulmonary hila, left greater than right, with overall poor ventilation. Perfusion: There is relative homogeneous distribution of injected radiotracer throughout the pulmonary parenchyma, with slight preferential perfusion of the left lung in relation to the right. No discrete mismatched segmental or subsegmental filling defects to suggest pulmonary embolism. IMPRESSION: Pulmonary embolism absent (very low probability of pulmonary embolism). Electronically Signed   By: Sandi Mariscal M.D.   On: 11/08/2016 14:09   Dg Chest Port 1 View  Result Date: 11/09/2016 CLINICAL DATA:  Shortness of breath and pulmonary edema. EXAM: PORTABLE CHEST 1 VIEW COMPARISON:  Chest radiograph November 08, 2016 FINDINGS: Cardiac silhouette is similarly enlarged. Mediastinal silhouette is nonsuspicious. RIGHT mid and LEFT lower lung zone consolidation. Mild lucency LEFT upper lobe consist with stent with bullous changes in COPD. Elevated RIGHT hemidiaphragm. No pleural effusion. No pneumothorax. Soft tissue planes and included osseous structures are unchanged. IMPRESSION: RIGHT mid and LEFT lower lung zone consolidation concerning for pneumonia. Followup PA and lateral chest X-ray is recommended in 3-4 weeks following trial of  antibiotic therapy to ensure resolution and exclude underlying malignancy. Stable cardiomegaly. Electronically Signed   By: Elon Alas M.D.   On: 11/09/2016 03:55   Dg Chest Portable 1 View  Result Date: 11/08/2016 CLINICAL DATA:  Vomiting blood and bile. History of aspiration pneumonia. EXAM: PORTABLE CHEST 1 VIEW COMPARISON:  June 10, 2015 FINDINGS: There is new infiltrate in the right mid and lower lung. Mild opacity in left base is similar in the interval. No pneumothorax. No other interval changes. IMPRESSION: 1. New infiltrate in the right mid and lower lung could represent pneumonia or aspiration. Aspiration should be considered given history. 2. Mild opacity in left base is similar in the interval, possibly scar or atelectasis. Electronically Signed   By: Dorise Bullion III M.D   On: 11/08/2016 08:38   Dg Swallowing Func-speech Pathology  Result Date: 11/09/2016 Objective Swallowing Evaluation: Type of Study: MBS-Modified Barium Swallow Study Patient Details Name: Jaekwon Mcclune MRN: [id number] Date of Birth: January 11, 1923 Today's Date: 11/09/2016 Time: SLP Start Time (ACUTE ONLY): 1030-SLP Stop Time (ACUTE ONLY): 1100 SLP Time Calculation (min) (ACUTE ONLY): 30 min Past Medical History: Past Medical History: Diagnosis Date . Aspiration pneumonia ([place])  . BPH (benign prostatic hyperplasia)  . CAD (coronary artery disease)   s/p stent . CKD (chronic kidney disease)  . COPD (chronic obstructive pulmonary disease) ([place])  . Depression  . GERD (gastroesophageal reflux disease)  . HLD (hyperlipidemia)  . PE (pulmonary embolism)  Past Surgical History: Past Surgical History: Procedure Laterality Date . APPENDECTOMY   . NEPHRECTOMY Right  HPI: Mr. Troublefield is a 83 yo M with a past medical history of CAD, CKD IV (  hx of nephrectomy x 70 yrs), pulmonary fibrosis, PE (on warfarin), adrenal insufficiency who presented to the ED with complaints of fever, hemoptysis/hematemesis. The details of the history were  provided by his son who he currently lives with.   This morning the patient woke up and felt cold with chills and was generally weak with increased work of breathing. His son took his temperature with a thermometer which was 101. He began coughing and spitting up bright red blood. He also had foul-smelling vomit, also with blood. The patient was in his usual state of health yesterday, last night noted to feel queasy and did not go out to dinner with family. No other recent complaints of increased cough, nausea vomiting or diarrhea, or other symptoms. Of note, he was admitted at an outside hospital on 3/30 for pneumonia. His son states he also had hemoptysis, fever at that time. His family denies any overt signs of aspiration in the patient.  Subjective: The patient was seen in radiology for MBS to determine current swallowing physiology and least restrictive diet.  Assessment / Plan / Recommendation CHL IP CLINICAL IMPRESSIONS 11/09/2016 Clinical Impression MBS was completed using thin liquids, nectar thick liquids, pureed material and dual textured solids.  The patient was noted to have a mild oropharyngeal dysphagia.  The oral phase was characterized by delayed oral transit given dual textured solids.  The pharyngeal phase was characterizced by a delayed swallow trigger with thin and nectar thick liquids triggering at the pyriform sinuses and pureed material and dual textured solids triggering at the vallecular.  Of note, following the initial strip the patient was noted to clear the pharyngeal space of all material but backflow from the esophagus was noted to the level of the pyriform sinuses in some cases leading to severe residue.  Visualization of the CP segment was difficult due to the patient's position and broad shoulders.  Despite attempts to improve view the cricopharyngeal segment was never able to be fully visualized.  He did not appear to have a [place] Diverticulum.  It is unclear if the patient's  cricopharngeal segment is tight or possibly closing prematurely.  Further assessment may be warranted as the patient is at very high risk for aspiration of the material that backflows to the pyriform sinuses.  Penetration to the cords was noted prior to the swallow given tsp sips of thin and nectar thick liquids.  Use of a chin tuck eliminated this.  Esophageal did not reveal other overt issues.  Recommend a regular diet with thin liquids.  The patient MUST TUCK CHIN for all liquid swallows.  He should also sit up for 30-60 mins after meals to assist with clearance of the pyriform residue.  Suggest possible GI work up to fully assess esophageal function.  ST will follow up for therapeutic diet tolerance and education.  Patient may benefit from ST at next level of care.   SLP Visit Diagnosis Dysphagia, oropharyngeal phase (R13.12) Attention and concentration deficit following -- Frontal lobe and executive function deficit following -- Impact on safety and function Moderate aspiration risk   CHL IP TREATMENT RECOMMENDATION 11/09/2016 Treatment Recommendations Therapy as outlined in treatment plan below   Prognosis 11/09/2016 Prognosis for Safe Diet Advancement Good Barriers to Reach Goals -- Barriers/Prognosis Comment -- CHL IP DIET RECOMMENDATION 11/09/2016 SLP Diet Recommendations Regular solids;Thin liquid Liquid Administration via Cup;No straw Medication Administration Crushed with puree Compensations Minimize environmental distractions;Slow rate;Small sips/bites;Chin tuck Postural Changes Seated upright at 90 degrees;Remain semi-upright after after  feeds/meals (Comment)   CHL IP OTHER RECOMMENDATIONS 11/09/2016 Recommended Consults -- Oral Care Recommendations Oral care QID;Oral care before and after PO Other Recommendations --   CHL IP FOLLOW UP RECOMMENDATIONS 11/09/2016 Follow up Recommendations Home health SLP   CHL IP FREQUENCY AND DURATION 11/09/2016 Speech Therapy Frequency (ACUTE ONLY) min 2x/week Treatment Duration  2 weeks      CHL IP ORAL PHASE 11/09/2016 Oral Phase Impaired Oral - Pudding Teaspoon -- Oral - Pudding Cup -- Oral - Honey Teaspoon -- Oral - Honey Cup -- Oral - Nectar Teaspoon -- Oral - Nectar Cup -- Oral - Nectar Straw -- Oral - Thin Teaspoon -- Oral - Thin Cup -- Oral - Thin Straw -- Oral - Puree -- Oral - Mech Soft -- Oral - Regular -- Oral - Multi-Consistency Delayed oral transit Oral - Pill -- Oral Phase - Comment --  CHL IP PHARYNGEAL PHASE 11/09/2016 Pharyngeal Phase Impaired Pharyngeal- Pudding Teaspoon -- Pharyngeal -- Pharyngeal- Pudding Cup -- Pharyngeal -- Pharyngeal- Honey Teaspoon -- Pharyngeal -- Pharyngeal- Honey Cup -- Pharyngeal -- Pharyngeal- Nectar Teaspoon Delayed swallow initiation-pyriform sinuses;Pharyngeal residue - pyriform Pharyngeal -- Pharyngeal- Nectar Cup Delayed swallow initiation-pyriform sinuses;Pharyngeal residue - pyriform Pharyngeal -- Pharyngeal- Nectar Straw -- Pharyngeal -- Pharyngeal- Thin Teaspoon Pharyngeal residue - pyriform Pharyngeal -- Pharyngeal- Thin Cup -- Pharyngeal -- Pharyngeal- Thin Straw -- Pharyngeal -- Pharyngeal- Puree Pharyngeal residue - pyriform Pharyngeal -- Pharyngeal- Mechanical Soft -- Pharyngeal -- Pharyngeal- Regular -- Pharyngeal -- Pharyngeal- Multi-consistency Pharyngeal residue - pyriform Pharyngeal -- Pharyngeal- Pill -- Pharyngeal -- Pharyngeal Comment --  CHL IP CERVICAL ESOPHAGEAL PHASE 11/09/2016 Cervical Esophageal Phase Impaired Pudding Teaspoon -- Pudding Cup -- Honey Teaspoon -- Honey Cup -- Nectar Teaspoon -- Nectar Cup -- Nectar Straw -- Thin Teaspoon -- Thin Cup -- Thin Straw -- Puree Esophageal backflow into the pharynx Mechanical Soft -- Regular -- Multi-consistency -- Pill -- Cervical Esophageal Comment Patient with some type of CP issues that were not well visualized.  Back flow from the CP segment not opening enough or closing too early most likely.   Shelly Flatten, MA, CCC-SLP Acute Rehab SLP [phone] Lamar Sprinkles 11/09/2016,  11:16 AM               Subjective: No complaints today  Discharge Exam: Vitals:   11/23/16 1100 11/23/16 1200  BP: (!) 172/65 (!) 157/69  Pulse: (!) 56 (!) 54  Resp: (!) 21 18  Temp:  97.9 F (36.6 C)  SpO2: 94% 96%   Vitals:   11/23/16 0400 11/23/16 0800 11/23/16 1100 11/23/16 1200  BP: (!) 156/71 (!) 167/74 (!) 172/65 (!) 157/69  Pulse: (!) 50 71 (!) 56 (!) 54  Resp: 13 20 (!) 21 18  Temp:  98.3 F (36.8 C)  97.9 F (36.6 C)  TempSrc:  Oral  Oral  SpO2: 91% 97% 94% 96%  Weight:      Height:        General: Pt is alert, awake, not in acute distress Cardiovascular: RRR, S1/S2 +, no rubs, no gallops Respiratory: CTA bilaterally, no wheezing, no rhonchi Abdominal: Soft, NT, ND, bowel sounds + Extremities: no edema, no cyanosis   The results of significant diagnostics from this hospitalization (including imaging, microbiology, ancillary and laboratory) are listed below for reference.     Microbiology: Recent Results (from the past 240 hour(s))  MRSA PCR Screening     Status: None   Collection Time: 11/20/16  6:20 PM  Result Value Ref Range  Status   MRSA by PCR NEGATIVE NEGATIVE Final    Comment:        The GeneXpert MRSA Assay (FDA approved for NASAL specimens only), is one component of a comprehensive MRSA colonization surveillance program. It is not intended to diagnose MRSA infection nor to guide or monitor treatment for MRSA infections.      Labs: BNP (last 3 results) No results for input(s): BNP in the last 8760 hours. Basic Metabolic Panel:  Recent Labs Lab 11/20/16 0413 11/21/16 0957 11/22/16 0310  NA 143 144 143  K 3.2* 3.4* 3.8  CL 110 111 110  CO2 23 22 25   GLUCOSE 82 78 86  BUN 25* 29* 26*  CREATININE 1.84* 1.72* 1.68*  CALCIUM 8.3* 8.1* 8.2*   Liver Function Tests:  Recent Labs Lab 11/20/16 0413 11/21/16 0957  AST 19 25  ALT 13* 14*  ALKPHOS 47 47  BILITOT 0.6 2.2*  PROT 5.2* 5.2*  ALBUMIN 2.5* 2.6*   No results for  input(s): LIPASE, AMYLASE in the last 168 hours. No results for input(s): AMMONIA in the last 168 hours. CBC:  Recent Labs Lab 11/20/16 0413 11/20/16 1913 11/21/16 0957 11/22/16 0310 11/23/16 0339  WBC 10.5 17.1* 9.7 8.7 8.4  HGB 9.6* 8.6* 8.7* 8.6* 8.7*  HCT 28.2* 25.8* 25.0* 24.8* 25.2*  MCV 90.1 92.1 89.0 90.8 92.0  PLT 347 344 293 298 310   Cardiac Enzymes: No results for input(s): CKTOTAL, CKMB, CKMBINDEX, TROPONINI in the last 168 hours. BNP: Invalid input(s): POCBNP CBG: No results for input(s): GLUCAP in the last 168 hours. D-Dimer No results for input(s): DDIMER in the last 72 hours. Hgb A1c No results for input(s): HGBA1C in the last 72 hours. Lipid Profile No results for input(s): CHOL, HDL, LDLCALC, TRIG, CHOLHDL, LDLDIRECT in the last 72 hours. Thyroid function studies No results for input(s): TSH, T4TOTAL, T3FREE, THYROIDAB in the last 72 hours.  Invalid input(s): FREET3 Anemia work up No results for input(s): VITAMINB12, FOLATE, FERRITIN, TIBC, IRON, RETICCTPCT in the last 72 hours. Urinalysis    Component Value Date/Time   COLORURINE YELLOW 11/08/2016 1018   APPEARANCEUR CLEAR 11/08/2016 1018   LABSPEC 1.014 11/08/2016 1018   PHURINE 5.0 11/08/2016 1018   GLUCOSEU NEGATIVE 11/08/2016 1018   HGBUR NEGATIVE 11/08/2016 [place] 11/08/2016 1018   KETONESUR NEGATIVE 11/08/2016 1018   PROTEINUR NEGATIVE 11/08/2016 1018   NITRITE NEGATIVE 11/08/2016 1018   LEUKOCYTESUR NEGATIVE 11/08/2016 1018   Sepsis Labs Invalid input(s): PROCALCITONIN,  WBC,  LACTICIDVEN Microbiology Recent Results (from the past 240 hour(s))  MRSA PCR Screening     Status: None   Collection Time: 11/20/16  6:20 PM  Result Value Ref Range Status   MRSA by PCR NEGATIVE NEGATIVE Final    Comment:        The GeneXpert MRSA Assay (FDA approved for NASAL specimens only), is one component of a comprehensive MRSA colonization surveillance program. It is  not intended to diagnose MRSA infection nor to guide or monitor treatment for MRSA infections.      SIGNED:   Donne Hazel, MD  [facility] 11/23/2016, 3:56 PM  If 7PM-7AM, please contact night-coverage [email or web address] Password [email or web address]

## 2016-11-23 NOTE — Progress Notes (Signed)
PROGRESS NOTE    Leonard Shepard  SPQ:[id number] DOB: Nov 30, 1922 DOA: 11/20/2016 PCP: Hennie Duos, MD    Brief Narrative:  83 year old male with history of obstructive lung disease, coronary artery disease status post stent placement, pulmonary embolism on chronic Coumadin, chronic kidney disease stage III, abdominal insufficiency on cortisone replacement, hypothyroidism, solitary left kidney recently admitted from 9/8-9/12 for aspiration pneumonia presented with bright red blood per rectum for 2 days. She was found to have a supratherapeutic INR on admission. Evaluated by GI. Bleeding scan positive for active bleeding on September 20. Seen by IR. After discussion with the patient and family decided not to do embolization her invasive procedure.   Assessment & Plan:   Principal Problem:   Lower GI bleed Active Problems:   Orthostasis   HLD (hyperlipidemia)   GERD (gastroesophageal reflux disease)   CAD (coronary artery disease)   S/P coronary artery stent placement   Acquired hypothyroidism   History of adrenal insufficiency   Hx pulmonary embolism   Hypertension   Anemia due to acute blood loss   Diverticulitis of colon with bleeding   Coagulopathy (HCC)  # Lower GI bleed from sigmoid colon: Suspect diverticular in origin. Patient had last colonoscopy over 10 years ago. Nuclear medicine bleeding scan positive for active bleeding at the site of sigmoid colon. IR discussed with the patient and family and decided not to do angiographically R embolization due to risk of kidney damage and complications. -Hgb stable thus far -Diet advanced -Discussed with GI with plans for IVC vs coumadin in 1-2 weeks vs nothing. Today, patient reports desiring least invasive modality  #History of pulmonary embolism twice on chronic Coumadin. Patient reported that he is on Coumadin for a long time. Supratherapeutic INR with bleeding on admission. Patient is high risk for bleeding with Coumadin. Dr.  Carolin Sicks discussed with Dr. Halford Chessman from pulmonology. Doppler ultrasound of lower extremities negative for DVT. Plan to hold Coumadin for now.. - PT consulted -Patient is from SNF but desires alternative SNF placement. SW consulted  # Acute blood loss anemia due to GIB; monitor CBC. Received red blood cell transfusion. Hgb stable. Labs reviewed  #Coronary artery disease: Continue Imdur, metoprolol. Chest pain free at present  #Hypothyroidism: Continue Synthroid. Stable currently  #History of adrenal insufficiency: Continue Cortef and Florinef. Currently stable  #Dyslipidemia: Continue fenofibrate, pravastatin. Presently stable  #GERD: Continue Protonix  #History of oropharyngeal dysphagia: Recently admitted for aspiration pneumonia.   #Chronic kidney disease is stage III: Patient has lower serum creatinine level than prior levels. Unknown exact baseline. Monitor BMP. Renal function stable. Repeat bmet in AM  #Hypertension: Blood pressure elevated. Continue Imdur, metoprolol. Recently increased the dose of amlodipine to 5 mg. BP stable  # Hypokalemia: replete KCL. Corrected. Labs reviewed  DVT prophylaxis: SCD Code Status: Full Family Communication: Pt in room, family not at bedside Disposition Plan: SNF - pt desires another SNF besides [employer]:   GI  IR  Procedures:     Antimicrobials: Anti-infectives    None       Subjective: Without complaints today. No abd pain  Objective: Vitals:   11/23/16 0000 11/23/16 0009 11/23/16 0200 11/23/16 0400  BP: (!) 173/73  (!) 181/85 (!) 156/71  Pulse: 61  (!) 57 (!) 50  Resp: 14  11 13   Temp:  98.4 F (36.9 C)    TempSrc:  Oral    SpO2: 96%  97% 91%  Weight:      Height:  Intake/Output Summary (Last 24 hours) at 11/23/16 0737 Last data filed at 11/23/16 0200  Gross per 24 hour  Intake              360 ml  Output             1350 ml  Net             -990 ml   Filed Weights   11/22/16  0900  Weight: 95.3 kg (210 lb)    Examination:  General exam: Appears calm and comfortable  Respiratory system: Clear to auscultation. Respiratory effort normal. Cardiovascular system: S1 & S2 heard, RRR.  Gastrointestinal system: Abdomen is nondistended, soft and nontender. No organomegaly or masses felt. Normal bowel sounds heard. Central nervous system: Alert and oriented. No focal neurological deficits. Extremities: Symmetric 5 x 5 power. Skin: No rashes, lesions Psychiatry: Judgement and insight appear normal. Mood & affect appropriate.   Data Reviewed: I have personally reviewed following labs and imaging studies  CBC:  Recent Labs Lab 11/20/16 0413 11/20/16 1913 11/21/16 0957 11/22/16 0310 11/23/16 0339  WBC 10.5 17.1* 9.7 8.7 8.4  HGB 9.6* 8.6* 8.7* 8.6* 8.7*  HCT 28.2* 25.8* 25.0* 24.8* 25.2*  MCV 90.1 92.1 89.0 90.8 92.0  PLT 347 344 293 298 585   Basic Metabolic Panel:  Recent Labs Lab 11/20/16 0413 11/21/16 0957 11/22/16 0310  NA 143 144 143  K 3.2* 3.4* 3.8  CL 110 111 110  CO2 23 22 25   GLUCOSE 82 78 86  BUN 25* 29* 26*  CREATININE 1.84* 1.72* 1.68*  CALCIUM 8.3* 8.1* 8.2*   GFR: Estimated Creatinine Clearance: 33.6 mL/min (A) (by C-G formula based on SCr of 1.68 mg/dL (H)). Liver Function Tests:  Recent Labs Lab 11/20/16 0413 11/21/16 0957  AST 19 25  ALT 13* 14*  ALKPHOS 47 47  BILITOT 0.6 2.2*  PROT 5.2* 5.2*  ALBUMIN 2.5* 2.6*   No results for input(s): LIPASE, AMYLASE in the last 168 hours. No results for input(s): AMMONIA in the last 168 hours. Coagulation Profile:  Recent Labs Lab 11/20/16 0413 11/20/16 1913 11/21/16 1438 11/22/16 0310 11/23/16 0339  INR 4.18* 1.68 1.25 1.31 1.28   Cardiac Enzymes: No results for input(s): CKTOTAL, CKMB, CKMBINDEX, TROPONINI in the last 168 hours. BNP (last 3 results) No results for input(s): PROBNP in the last 8760 hours. HbA1C: No results for input(s): HGBA1C in the last 72  hours. CBG: No results for input(s): GLUCAP in the last 168 hours. Lipid Profile: No results for input(s): CHOL, HDL, LDLCALC, TRIG, CHOLHDL, LDLDIRECT in the last 72 hours. Thyroid Function Tests: No results for input(s): TSH, T4TOTAL, FREET4, T3FREE, THYROIDAB in the last 72 hours. Anemia Panel: No results for input(s): VITAMINB12, FOLATE, FERRITIN, TIBC, IRON, RETICCTPCT in the last 72 hours. Sepsis Labs: No results for input(s): PROCALCITON, LATICACIDVEN in the last 168 hours.  Recent Results (from the past 240 hour(s))  MRSA PCR Screening     Status: None   Collection Time: 11/20/16  6:20 PM  Result Value Ref Range Status   MRSA by PCR NEGATIVE NEGATIVE Final    Comment:        The GeneXpert MRSA Assay (FDA approved for NASAL specimens only), is one component of a comprehensive MRSA colonization surveillance program. It is not intended to diagnose MRSA infection nor to guide or monitor treatment for MRSA infections.      Radiology Studies: No results found.  Scheduled Meds: . amLODipine  5  mg Oral Daily  . colesevelam  1,875 mg Oral BID WC  . fenofibrate  160 mg Oral Daily  . finasteride  5 mg Oral Daily  . fludrocortisone  0.1 mg Oral Daily  . hydrocortisone  10 mg Oral q morning - 10a  . hydrocortisone  5 mg Oral QPM  . isosorbide mononitrate  60 mg Oral Daily  . levothyroxine  25 mcg Oral QAC breakfast  . metoprolol succinate  25 mg Oral Daily  . pantoprazole  40 mg Oral Daily  . pravastatin  20 mg Oral Daily  . sertraline  50 mg Oral Daily   Continuous Infusions: . sodium chloride       LOS: 3 days   Kurtis Anastasia, Orpah Melter, MD [facility] Pager [phone]  If 7PM-7AM, please contact night-coverage [email or web address] Password [email or web address] 11/23/2016, 7:37 AM

## 2016-11-24 ENCOUNTER — Telehealth: Payer: Self-pay

## 2016-11-24 LAB — TYPE AND SCREEN
ABO/RH(D): A POS
ANTIBODY SCREEN: NEGATIVE
UNIT DIVISION: 0
Unit division: 0

## 2016-11-24 LAB — BPAM RBC
BLOOD PRODUCT EXPIRATION DATE: 201809282359
Blood Product Expiration Date: 201809252359
ISSUE DATE / TIME: 201809210033
ISSUE DATE / TIME: 201809210355
UNIT TYPE AND RH: 600
Unit Type and Rh: 600

## 2016-11-24 NOTE — Telephone Encounter (Signed)
This is a patient of [place], who was admitted to [facility] after hospitalization. [facility] F/U is needed. Hospital discharge from [facility] on 11/23/2016.

## 2016-11-25 ENCOUNTER — Encounter: Payer: Self-pay | Admitting: Internal Medicine

## 2016-11-25 ENCOUNTER — Non-Acute Institutional Stay (SKILLED_NURSING_FACILITY): Payer: Medicare Other | Admitting: Internal Medicine

## 2016-11-25 DIAGNOSIS — T380X5A Adverse effect of glucocorticoids and synthetic analogues, initial encounter: Secondary | ICD-10-CM

## 2016-11-25 DIAGNOSIS — I2510 Atherosclerotic heart disease of native coronary artery without angina pectoris: Secondary | ICD-10-CM | POA: Diagnosis not present

## 2016-11-25 DIAGNOSIS — Z905 Acquired absence of kidney: Secondary | ICD-10-CM | POA: Diagnosis not present

## 2016-11-25 DIAGNOSIS — Z9229 Personal history of other drug therapy: Secondary | ICD-10-CM

## 2016-11-25 DIAGNOSIS — K922 Gastrointestinal hemorrhage, unspecified: Secondary | ICD-10-CM

## 2016-11-25 DIAGNOSIS — I10 Essential (primary) hypertension: Secondary | ICD-10-CM

## 2016-11-25 DIAGNOSIS — E039 Hypothyroidism, unspecified: Secondary | ICD-10-CM

## 2016-11-25 DIAGNOSIS — E273 Drug-induced adrenocortical insufficiency: Secondary | ICD-10-CM

## 2016-11-25 DIAGNOSIS — Z86711 Personal history of pulmonary embolism: Secondary | ICD-10-CM

## 2016-11-25 DIAGNOSIS — D62 Acute posthemorrhagic anemia: Secondary | ICD-10-CM

## 2016-11-25 DIAGNOSIS — I2782 Chronic pulmonary embolism: Secondary | ICD-10-CM

## 2016-11-25 DIAGNOSIS — N183 Chronic kidney disease, stage 3 unspecified: Secondary | ICD-10-CM

## 2016-11-25 DIAGNOSIS — Z955 Presence of coronary angioplasty implant and graft: Secondary | ICD-10-CM | POA: Diagnosis not present

## 2016-11-25 NOTE — Progress Notes (Signed)
: Provider: Hennie Duos MD Location:  [place] Room Number: 58 Place of Service:  SNF [phone])  PCP: Hennie Duos, MD Patient Care Team: Hennie Duos, MD as PCP - General (Internal Medicine)  Extended Emergency Contact Information Primary Emergency Contact: Roseanna Rainbow [place] Phone: [phone] Mobile Phone: [phone] Relation: Son     Allergies: Aclidinium bromide  Chief Complaint  Patient presents with  . Readmit To SNF    following hospitalization 11/20/16 to 11/23/16 lower GI bleed    HPI: Patient is 82 y.o. male with COPD, coronary artery disease status post stent, PE on chronic Coumadin, chronic kidney disease stage III, adrenal insufficiency on cortisone replacement, hypothyroidism, solitary left kidney, who presented to [facility] emergency department with bright red blood per rectum 2 days associated with tenesmus. Patient feels a burning has gotten steadily worse and he does complain of fatigue. He was told that his last colonoscopy more than 5 years ago that he had diverticulosis and some polyps. Patient was found to have a super therapeutic INR. Patient was admitted to [facility] from 9/20-23 for management of his lower GI bleed. Patient was of course taken off of his Coumadin. Nuclear medicine bleeding scan was positive for active bleeding at the site of the sigmoid colon. Interventional radiology discussed with family and patient who decided not to do embolization due to the risk of kidney damage and complications. Patient's work prior to transfusion 1 unit PRBC after which his hemoglobin was stable. Hospital course was, located by blood pressure elevated, requiring a new medication and by hypokalemia which was repleted. Patient is admitted to skilled nursing facility with generalized weakness for OT/PT. While at skilled nursing facility patient will be followed for history of PE, treated  with Coumadin, coronary artery disease treated with Inderal and metoprolol and adrenal insufficiency treated with Cortef and Florinef.  Past Medical History:  Diagnosis Date  . Acute renal failure superimposed on stage 4 chronic kidney disease ([place]) 11/16/2016  . Anemia due to acute blood loss   . Aspiration pneumonia ([place])   . BPH (benign prostatic hyperplasia)   . CAD (coronary artery disease)    s/p stent  . Chronic renal insufficiency, stage III (moderate)   . CKD (chronic kidney disease)   . COPD (chronic obstructive pulmonary disease) ([place])   . Depression   . Diverticulitis of colon with bleeding   . GERD (gastroesophageal reflux disease)   . H/O unilateral nephrectomy   . HLD (hyperlipidemia)   . Lower GI bleed 11/20/2016  . Oropharyngeal dysphagia 11/10/2016  . PE (pulmonary embolism)   . Postinflammatory pulmonary fibrosis (HCC)     Past Surgical History:  Procedure Laterality Date  . APPENDECTOMY    . NEPHRECTOMY Right     Allergies as of 11/25/2016      Reactions   Aclidinium Bromide Itching, Other (See Comments)   Throat irritation      Medication List       Accurate as of 11/25/16 10:58 AM. Always use your most recent med list.          amLODipine 2.5 MG tablet Commonly known as:  NORVASC Take 2.5 mg by mouth daily.   colesevelam 625 MG tablet Commonly known as:  WELCHOL Take 1,875 mg by mouth 2 (two) times daily with a meal.   diphenhydramine-acetaminophen 25-500 MG Tabs tablet Commonly known as:  TYLENOL PM Take 2 tablets by mouth  at bedtime.   fenofibrate 160 MG tablet Take 160 mg by mouth daily.   finasteride 5 MG tablet Commonly known as:  PROSCAR Take 5 mg by mouth daily.   fludrocortisone 0.1 MG tablet Commonly known as:  FLORINEF Take 0.1 mg by mouth daily.   fluticasone 50 MCG/ACT nasal spray Commonly known as:  FLONASE Place 1 spray into both nostrils daily.   furosemide 20 MG tablet Commonly known as:  LASIX Take 20 mg by  mouth daily as needed for fluid.   guaifenesin 100 MG/5ML syrup Commonly known as:  ROBITUSSIN Take 200 mg by mouth every 6 (six) hours as needed for cough.   hydrocortisone 10 MG tablet Commonly known as:  CORTEF Take 1 tablet in the morning and half a tablet at night.   hydrocortisone 25 MG suppository Commonly known as:  ANUSOL-HC Place 25 mg rectally 3 (three) times daily. For 2 weeks   isosorbide mononitrate 60 MG 24 hr tablet Commonly known as:  IMDUR Take 60 mg by mouth daily.   levothyroxine 25 MCG tablet Commonly known as:  SYNTHROID, LEVOTHROID Take 25 mcg by mouth daily before breakfast.   metoprolol succinate 25 MG 24 hr tablet Commonly known as:  TOPROL-XL Take 25 mg by mouth daily.   ondansetron 4 MG tablet Commonly known as:  ZOFRAN Take 4 mg by mouth every 8 (eight) hours as needed for nausea or vomiting.   pantoprazole 40 MG tablet Commonly known as:  PROTONIX Take 40 mg by mouth daily.   pravastatin 20 MG tablet Commonly known as:  PRAVACHOL Take 20 mg by mouth daily.   sertraline 50 MG tablet Commonly known as:  ZOLOFT Take 50 mg by mouth daily.   SLOW-MAG PO Take 1 tablet by mouth daily.            Discharge Care Instructions        Start     Ordered   11/25/16 0000  CBC and differential    Comments:  This external order was created through the Results Console.    11/25/16 1056   11/25/16 6644  Basic metabolic panel    Comments:  This external order was created through the Results Console.    11/25/16 1056      No orders of the defined types were placed in this encounter.   Immunization History  Administered Date(s) Administered  . PPD Test 11/12/2016    Social History  Substance Use Topics  . Smoking status: Never Smoker  . Smokeless tobacco: Never Used  . Alcohol use No    Family history is   Family History  Problem Relation Age of Onset  . Lung disease Father   . Leukemia Brother       Review of  Systems  DATA OBTAINED: from patient, nurse GENERAL:  no fevers, fatigue, appetite changes SKIN: No itching, or rash EYES: No eye pain, redness, discharge EARS: No earache, tinnitus, change in hearing NOSE: No congestion, drainage or bleeding  MOUTH/THROAT: No mouth or tooth pain, No sore throat RESPIRATORY: No cough, wheezing, SOB CARDIAC: No chest pain, palpitations, lower extremity edema  GI: No abdominal pain, No N/V/D or constipation, No heartburn or reflux  GU: No dysuria, frequency or urgency, or incontinence  MUSCULOSKELETAL: No unrelieved bone/joint pain NEUROLOGIC: No headache, dizziness or focal weakness PSYCHIATRIC: No c/o anxiety or sadness   Vitals:   11/25/16 1044  BP: 140/70  Pulse: (!) 56  Resp: (!) 22  Temp: 97.9 F (  36.6 C)  SpO2: 94%    SpO2 Readings from Last 1 Encounters:  11/25/16 94%   Body mass index is 28.48 kg/m.     Physical Exam  GENERAL APPEARANCE: Alert, conversant,  No acute distress.  SKIN: No diaphoresis rash HEAD: Normocephalic, atraumatic  EYES: Conjunctiva/lids clear. Pupils round, reactive. EOMs intact.  EARS: External exam WNL, canals clear. Hearing grossly normal.  NOSE: No deformity or discharge.  MOUTH/THROAT: Lips w/o lesions  RESPIRATORY: Breathing is even, unlabored. Lung sounds are clear   CARDIOVASCULAR: Heart RRR no murmurs, rubs or gallops. No peripheral edema.   GASTROINTESTINAL: Abdomen is soft, non-tender, not distended w/ normal bowel sounds. GENITOURINARY: Bladder non tender, not distended  MUSCULOSKELETAL: No abnormal joints or musculature NEUROLOGIC:  Cranial nerves 2-12 grossly intact. Moves all extremities  PSYCHIATRIC: Mood and affect appropriate to situation, no behavioral issues  Patient Active Problem List   Diagnosis Date Noted  . HX: anticoagulation   . Anemia due to acute blood loss   . Diverticulitis of colon with bleeding   . Coagulopathy ([place])   . Lower GI bleed 11/20/2016  . Dysphasia  11/16/2016  . Acute renal failure superimposed on stage 4 chronic kidney disease ([place]) 11/16/2016  . Adrenal insufficiency due to steroid withdrawal ([place]) 11/16/2016  . Hypertension 11/16/2016  . Abdominal aortic atherosclerosis ([place]) 11/10/2016  . Oropharyngeal dysphagia 11/10/2016  . Hemoptysis   . Chronic anticoagulation   . S/P coronary artery stent placement   . Acquired hypothyroidism   . History of adrenal insufficiency   . Postinflammatory pulmonary fibrosis ([place])   . H/O unilateral nephrectomy   . Hx pulmonary embolism   . Aspiration pneumonia ([place]) 11/08/2016  . Orthostasis 06/11/2015  . Sepsis ([place]) 06/11/2015  . Nausea & vomiting 06/11/2015  . Abdominal pain 06/11/2015  . HLD (hyperlipidemia)   . GERD (gastroesophageal reflux disease)   . CAD (coronary artery disease)   . PE (pulmonary embolism)   . BPH (benign prostatic hyperplasia)   . Chronic renal insufficiency, stage III (moderate)   . COPD (chronic obstructive pulmonary disease) ([place])   . Depression       Labs reviewed: Basic Metabolic Panel:    Component Value Date/Time   NA 143 11/22/2016 0310   NA 144 11/17/2016   K 3.8 11/22/2016 0310   CL 110 11/22/2016 0310   CO2 25 11/22/2016 0310   GLUCOSE 86 11/22/2016 0310   BUN 26 (H) 11/22/2016 0310   BUN 31 (A) 11/17/2016   CREATININE 1.68 (H) 11/22/2016 0310   CALCIUM 8.2 (L) 11/22/2016 0310   PROT 5.2 (L) 11/21/2016 0957   ALBUMIN 2.6 (L) 11/21/2016 0957   AST 25 11/21/2016 0957   ALT 14 (L) 11/21/2016 0957   ALKPHOS 47 11/21/2016 0957   BILITOT 2.2 (H) 11/21/2016 0957   GFRNONAA 34 (L) 11/22/2016 0310   GFRAA 39 (L) 11/22/2016 0310     Recent Labs  11/20/16 0413 11/21/16 0957 11/22/16 0310  NA 143 144 143  K 3.2* 3.4* 3.8  CL 110 111 110  CO2 23 22 25   GLUCOSE 82 78 86  BUN 25* 29* 26*  CREATININE 1.84* 1.72* 1.68*  CALCIUM 8.3* 8.1* 8.2*   Liver Function Tests:  Recent Labs  11/08/16 0815 11/20/16 0413 11/21/16 0957  AST  35 19 25  ALT 17 13* 14*  ALKPHOS 56 47 47  BILITOT 0.9 0.6 2.2*  PROT 6.1* 5.2* 5.2*  ALBUMIN 3.3* 2.5* 2.6*    Recent Labs  11/08/16 0815  LIPASE 56*   No results for input(s): AMMONIA in the last 8760 hours. CBC:  Recent Labs  11/08/16 0815  11/21/16 0957 11/22/16 0310 11/23/16 0339  WBC 10.4  < > 9.7 8.7 8.4  NEUTROABS 8.0*  --   --   --   --   HGB 12.6*  < > 8.7* 8.6* 8.7*  HCT 39.6  < > 25.0* 24.8* 25.2*  MCV 91.7  < > 89.0 90.8 92.0  PLT 256  < > 293 298 310  < > = values in this interval not displayed. Lipid No results for input(s): CHOL, HDL, LDLCALC, TRIG in the last 8760 hours.  Cardiac Enzymes: No results for input(s): CKTOTAL, CKMB, CKMBINDEX, TROPONINI in the last 8760 hours. BNP: No results for input(s): BNP in the last 8760 hours. No results found for: MICROALBUR No results found for: HGBA1C No results found for: TSH No results found for: VITAMINB12 No results found for: FOLATE No results found for: IRON, TIBC, FERRITIN  Imaging and Procedures obtained prior to SNF admission: Nm Gi Blood Loss  Addendum Date: 11/20/2016   ADDENDUM REPORT: 11/20/2016 20:00 ADDENDUM: I discussed these findings by telephone with Dr. Allyson Sabal at approximately 1830 hours on 11/20/2016. Electronically Signed   By: Misty Stanley M.D.   On: 11/20/2016 20:00   Result Date: 11/20/2016 CLINICAL DATA:  GI bleed intermittent for 2 days. EXAM: NUCLEAR MEDICINE GASTROINTESTINAL BLEEDING SCAN TECHNIQUE: Sequential abdominal images were obtained following intravenous administration of Tc-51m labeled red blood cells. RADIOPHARMACEUTICALS:  27.4 mCi Tc-24m in-vitro labeled red cells. COMPARISON:  CT scan from 04/10/ 2017 FINDINGS: There is an area of extravascular tracer accumulation in the lower abdomen/pelvis with some linear retrograde movement during the exam. This collection appears to clear between the first and second hours of imaging and the second hour of planar imaging shows further  accumulation of radiotracer in the same region, but to a greater degree. Some portions of the tracer accumulation on the second hour do clear but there is no gross migration of the apparent extra vascular radiotracer. Movement of the radiotracer is more apparent on the stacked cine images. Reviewing coronal imaging from the previous CT scan, this tracer accumulation is in the region of the patient's sigmoid colon and has a similar configuration. IMPRESSION: Imaging features are highly suspicious for active bleeding in the sigmoid colon. Electronically Signed: By: Misty Stanley M.D. On: 11/20/2016 18:23     Not all labs, radiology exams or other studies done during hospitalization come through on my EPIC note; however they are reviewed by me.    Assessment and Plan  LOWER GI bleed-suspected diverticular in origin at the sigmoid colon; family and patient decided not to do an embolization due to reduced to kidney damage and complications; patient to resume Coumadin in 1-2 weeks versus outpatient referral for IVC placement. SNF -admitted for generalized weakness for OT/PT  CHRONIC PE-on chronic Coumadin; patient was supratherapeutic on admission; patient is at high risk for bleeding with Coumadin; patient had a DVT of bilateral lower extremities which was negative for DVT; patient is Coumadin is on hold at the present moment for being elevated SNF - we'll follow INR until he comes into the normal range and then we will restart Coumadin unless patient desires to have an IVC filter placed  ACUTE BLOOD LOSS ANEMIA; due to GI bleed; patient received 1 unit PRBC; discharge hemoglobin 8.7 SNF- follow-up CBC  CAD SNF - stable; continue Imdur 24 hour  tablet 60 mg by mouth daily metoprolol XL 25 mg daily and statin  HYPOTHYROIDISM SNF -stable; continue Synthroid 25 g by mouth daily  ADRENAL INSUFFICIENCY SNF - stable; continue the cortisone 10 mg every morning and 5 mg daily at bedtime and Florinef 0.1  mg by mouth daily  CKD stage III-patient had C serum creatinine levels and prior SNF - will monitor BMP  HYPERTENSION SNF - blood pressure uncontrolled with D admission of Norvasc 5 mg by mouth daily; plan to continue metoprolol XL 25 mg by mouth daily and Imdur 60 mg by mouth daily   Time spent greater than 45 minutes;> 50% of time with patient was spent reviewing records, labs, tests and studies, counseling and developing plan of care  Webb Silversmith D. Sheppard Coil, MD

## 2016-11-26 ENCOUNTER — Encounter: Payer: Self-pay | Admitting: Internal Medicine

## 2016-11-28 ENCOUNTER — Ambulatory Visit (INDEPENDENT_AMBULATORY_CARE_PROVIDER_SITE_OTHER): Payer: Medicare Other | Admitting: Nurse Practitioner

## 2016-11-28 ENCOUNTER — Encounter: Payer: Self-pay | Admitting: Nurse Practitioner

## 2016-11-28 VITALS — BP 140/74 | HR 64 | Ht 72.0 in | Wt 205.0 lb

## 2016-11-28 DIAGNOSIS — I2510 Atherosclerotic heart disease of native coronary artery without angina pectoris: Secondary | ICD-10-CM | POA: Diagnosis not present

## 2016-11-28 DIAGNOSIS — R1310 Dysphagia, unspecified: Secondary | ICD-10-CM | POA: Diagnosis not present

## 2016-11-28 NOTE — Patient Instructions (Signed)
If you are age 81 or older, your body mass index should be between 23-30. Your Body mass index is 27.8 kg/m. If this is out of the aforementioned range listed, please consider follow up with your Primary Care Provider.  If you are age 17 or younger, your body mass index should be between 19-25. Your Body mass index is 27.8 kg/m. If this is out of the aformentioned range listed, please consider follow up with your Primary Care Provider.   Awaiting patient's son, Delsa Bern to call me.  Thank you for choosing me and [facility].   Tye Savoy, NP

## 2016-11-28 NOTE — Progress Notes (Signed)
Chief complaint:    Patient not sure why he is here but thinks because of recent GI bleed   HPI:  Patient is a 83 year old male referred by Inocencio Homes, MD for evaluation of dysphagia.  He is currently residing at an assisted living facility at [facility]. He has pulmonary fibrosis, CAD status post stent placement, PE on chronic Coumadin. He has CKD stage III, thyroid disease. He is on chronic cortisone replacement for adrenal insufficiency. He has a solitary kidney. Patient was hospitalized early Sept with aspiration PNA. Not long after discharge he was readmitted for painless rectal bleeding.  His baseline hemoglobin of 11-12 declined to the mid 8 range and he was given blood. Hemoglobin by discharge was 8.7.  We saw him in consultation (Dr. Henrene Pastor).  Bleeding scan was positive with activity in the sigmoid colon. Patient and family decided not to pursue embolization or IVC filter given comorbidities including chronic kidney disease.  After correction of coagulopathy the bleeding ceased. The bleeding was felt to be a diverticular hemorrhage. Coumadin is still on hold. He's had no further bleeding.  While hospitalized for pneumonia patient had ST therapy evaluation. MBSS revealed mild oropharyngeal dysphasia. There was backflow from the esophagus to the level of the piriform sinus resulting in significant residue. He was felt to be very high risk for aspiration given the backflow. Penetration to the cords was noted prior to the swallow when given sips of thin and nectar thick liquids. Chin tucks eliminated this. Patient was advised to follow a regular diet with thin liquids and use chin tuck with all liquids. Patient has been using the chin tucks, sitting upright 30-60 minutes after meals and eating slowly with liquids in between each bite. He was having some problems with occasional solid food dysphagia but this has now resolved with above measures.      Past Medical History:  Diagnosis  Date  . Acute renal failure superimposed on stage 4 chronic kidney disease ([place]) 11/16/2016  . Anemia due to acute blood loss   . Aspiration pneumonia ([place])   . BPH (benign prostatic hyperplasia)   . CAD (coronary artery disease)    s/p stent  . Chronic renal insufficiency, stage III (moderate)   . CKD (chronic kidney disease)   . COPD (chronic obstructive pulmonary disease) ([place])   . Depression   . Diverticulitis of colon with bleeding   . GERD (gastroesophageal reflux disease)   . H/O unilateral nephrectomy   . HLD (hyperlipidemia)   . Lower GI bleed 11/20/2016  . Oropharyngeal dysphagia 11/10/2016  . PE (pulmonary embolism)   . Postinflammatory pulmonary fibrosis (HCC)      Past Surgical History:  Procedure Laterality Date  . APPENDECTOMY    . NEPHRECTOMY Right    Family History  Problem Relation Age of Onset  . Lung disease Father   . Leukemia Brother    Social History  Substance Use Topics  . Smoking status: Never Smoker  . Smokeless tobacco: Never Used  . Alcohol use No   Current Outpatient Prescriptions  Medication Sig Dispense Refill  . amLODipine (NORVASC) 2.5 MG tablet Take 2.5 mg by mouth daily.    . colesevelam (WELCHOL) 625 MG tablet Take 1,875 mg by mouth 2 (two) times daily with a meal.     . diphenhydramine-acetaminophen (TYLENOL PM) 25-500 MG TABS tablet Take 2 tablets by mouth at bedtime.    . fenofibrate 160 MG tablet Take 160 mg by mouth daily.    Marland Kitchen  finasteride (PROSCAR) 5 MG tablet Take 5 mg by mouth daily.    . fludrocortisone (FLORINEF) 0.1 MG tablet Take 0.1 mg by mouth daily.    . fluticasone (FLONASE) 50 MCG/ACT nasal spray Place 1 spray into both nostrils daily.    . furosemide (LASIX) 20 MG tablet Take 20 mg by mouth daily as needed for fluid.    Marland Kitchen guaifenesin (ROBITUSSIN) 100 MG/5ML syrup Take 200 mg by mouth every 6 (six) hours as needed for cough.    . hydrocortisone (ANUSOL-HC) 25 MG suppository Place 25 mg rectally 3 (three) times daily.  For 2 weeks    . hydrocortisone (CORTEF) 10 MG tablet Take 1 tablet in the morning and half a tablet at night. 45 tablet 2  . isosorbide mononitrate (IMDUR) 60 MG 24 hr tablet Take 60 mg by mouth daily.    Marland Kitchen levothyroxine (SYNTHROID, LEVOTHROID) 25 MCG tablet Take 25 mcg by mouth daily before breakfast.    . Magnesium Cl-Calcium Carbonate (SLOW-MAG PO) Take 1 tablet by mouth daily.    . metoprolol succinate (TOPROL-XL) 25 MG 24 hr tablet Take 25 mg by mouth daily.    . ondansetron (ZOFRAN) 4 MG tablet Take 4 mg by mouth every 8 (eight) hours as needed for nausea or vomiting.    . pantoprazole (PROTONIX) 40 MG tablet Take 40 mg by mouth daily.    . pravastatin (PRAVACHOL) 20 MG tablet Take 20 mg by mouth daily.    . sertraline (ZOLOFT) 50 MG tablet Take 50 mg by mouth daily.     No current facility-administered medications for this visit.    Allergies  Allergen Reactions  . Aclidinium Bromide Itching and Other (See Comments)    Throat irritation     Review of Systems: All systems reviewed and negative except where noted in HPI.    Physical Exam: There were no vitals taken for this visit. Constitutional:  Well-developed, white male in a wheelchair in no acute distress.  Psychiatric: Normal mood and affect. Behavior is normal. EENT: Pupils normal.  Conjunctivae are normal. No scleral icterus. Neck supple.  Cardiovascular: Normal rate, regular rhythm. BLE pitting edema Pulmonary/chest: Effort normal and breath sounds normal. No wheezing, rales or rhonchi. Abdominal: Soft, nondistended. Nontender. Bowel sounds active throughout. There are no masses palpable. No hepatomegaly. Lymphadenopathy: No cervical adenopathy noted. Neurological: Alert and oriented to person place and time. Skin: Skin is warm and dry. No rashes noted.   ASSESSMENT AND PLAN:  Very pleasant 83 yo male with oropharyngeal dysphagia / recent hospitalization for PNA, presumed aspiration. Hospital records reviewed.   MBBS suggested backflow from esophagus.   -Long discussion with the patient as he was not clear about the purpose of this visit today. He was having some solid food dysphagia but after following ST recommendations this has resolved. Patient says he has been told through the years that he has swallowing problems and was basically given the same speech therapy recommendations years ago. He is not particularly interested in pursuing an esophagram and especially not an upper endoscopy (even if esophagram was abnormal). Patient would like me to speak with his son Delsa Bern who helps make medical decisions. Delsa Bern is out of town right now. I will await his call.   Recent admission for lower GI bleed, presumed diverticular hemorrhage. IR evaluated. . Abnormal bleeding scan with activity in the sigmoid colon. Patient/family did not want to pursue embolization given multiple comorbidities especially chronic kidney disease. After correction of coagulopathy bleeding stopped. He  did require unit of blood. Coumadin is still on call, no further bleeding  I spent 25 minutes of face-to-face time with the patient. Greater than 50% of the time was spent counseling and coordinating care. Questions answered   Tye Savoy, NP  11/28/2016, 8:47 AM

## 2016-12-01 NOTE — Progress Notes (Signed)
Reviewed. I agree that he does not need additional workup or endoscopy at this point

## 2016-12-09 ENCOUNTER — Encounter: Payer: Self-pay | Admitting: Internal Medicine

## 2016-12-09 ENCOUNTER — Non-Acute Institutional Stay (SKILLED_NURSING_FACILITY): Payer: Medicare Other | Admitting: Internal Medicine

## 2016-12-09 DIAGNOSIS — R791 Abnormal coagulation profile: Secondary | ICD-10-CM

## 2016-12-09 DIAGNOSIS — I2782 Chronic pulmonary embolism: Secondary | ICD-10-CM | POA: Diagnosis not present

## 2016-12-09 DIAGNOSIS — J449 Chronic obstructive pulmonary disease, unspecified: Secondary | ICD-10-CM | POA: Diagnosis not present

## 2016-12-09 DIAGNOSIS — K5733 Diverticulitis of large intestine without perforation or abscess with bleeding: Secondary | ICD-10-CM | POA: Diagnosis not present

## 2016-12-09 DIAGNOSIS — K922 Gastrointestinal hemorrhage, unspecified: Secondary | ICD-10-CM

## 2016-12-09 DIAGNOSIS — Z8639 Personal history of other endocrine, nutritional and metabolic disease: Secondary | ICD-10-CM | POA: Diagnosis not present

## 2016-12-09 DIAGNOSIS — D62 Acute posthemorrhagic anemia: Secondary | ICD-10-CM

## 2016-12-09 DIAGNOSIS — I10 Essential (primary) hypertension: Secondary | ICD-10-CM | POA: Diagnosis not present

## 2016-12-09 DIAGNOSIS — I2510 Atherosclerotic heart disease of native coronary artery without angina pectoris: Secondary | ICD-10-CM

## 2016-12-09 DIAGNOSIS — R0902 Hypoxemia: Secondary | ICD-10-CM | POA: Diagnosis not present

## 2016-12-09 DIAGNOSIS — N179 Acute kidney failure, unspecified: Secondary | ICD-10-CM

## 2016-12-09 DIAGNOSIS — N184 Chronic kidney disease, stage 4 (severe): Secondary | ICD-10-CM

## 2016-12-09 DIAGNOSIS — E039 Hypothyroidism, unspecified: Secondary | ICD-10-CM

## 2016-12-09 NOTE — Progress Notes (Signed)
Location:  [facility] [place] Room Number: [place] of Service:  SNF (31)  PCP: Hennie Duos, MD Patient Care Team: Hennie Duos, MD as PCP - General (Internal Medicine)  Extended Emergency Contact Information Primary Emergency Contact: Jake Seats of [place] Phone: [phone] Mobile Phone: [phone] Relation: Son  Allergies  Allergen Reactions  . Aclidinium Bromide Itching and Other (See Comments)    Throat irritation    Chief Complaint  Patient presents with  . Discharge Note    discharge SNF to home    HPI:  83 y.o. male  COPD, coronary artery disease status post stent, PE on chronic Coumadin, chronic kidney disease stage III, adrenal insufficiency" her son replacement, hypothyroidism, solitary left kidney, who was admitted to [facility] from 9/20-23 for a lower GI bleed and a supratherapeutic INR. Decision was made not to embolize area due to risk of kidney damage and complications. Patient received 1 unit PRBCs after which his hemoglobin was stable. Hospital course was complicated by blood pressure elevation requiring any medication and by hypokalemia which was repleted. Patient was admitted to skilled nursing facility for OT/PT and is now ready to be discharged to home.    Past Medical History:  Diagnosis Date  . Acute renal failure superimposed on stage 4 chronic kidney disease ([place]) 11/16/2016  . Anemia due to acute blood loss   . Aspiration pneumonia ([place])   . BPH (benign prostatic hyperplasia)   . CAD (coronary artery disease)    s/p stent  . Chronic renal insufficiency, stage III (moderate) (H[facility])   . CKD (chronic kidney disease)   . COPD (chronic obstructive pulmonary disease) ([place])   . Depression   . Diverticulitis of colon with bleeding   . GERD (gastroesophageal reflux disease)   . H/O unilateral nephrectomy   . Heart attack ([place])   . HLD (hyperlipidemia)   . Lower GI bleed  11/20/2016  . Oropharyngeal dysphagia 11/10/2016  . PE (pulmonary embolism)   . Postinflammatory pulmonary fibrosis ([place])   . Rotator cuff tear, right     Past Surgical History:  Procedure Laterality Date  . APPENDECTOMY    . CHOLECYSTECTOMY    . CIRCUMCISION    . COLONOSCOPY    . heart stint    . INGUINAL HERNIA REPAIR Right   . KNEE SURGERY Right    Pt unsure of what the Dr did, his knee had collasped on him  . lumbar back surgery     x 2  . NEPHRECTOMY Right    3 surgeries on this kidney before finally removing it  . SHOULDER SURGERY Left    for spurs  . TONSILLECTOMY AND ADENOIDECTOMY       reports that he has quit smoking. His smoking use included Cigarettes and Pipe. He has never used smokeless tobacco. He reports that he drinks alcohol. He reports that he does not use drugs. Social History   Social History  . Marital status: Widowed    Spouse name: N/A  . Number of children: 2  . Years of education: N/A   Occupational History  . Not on file.   Social History Main Topics  . Smoking status: Former Smoker    Types: Cigarettes, Pipe  . Smokeless tobacco: Never Used  . Alcohol use 0.0 oz/week     Comment: occ.  . Drug use: No  . Sexual activity: Not Currently   Other Topics Concern  . Not  on file   Social History Narrative   Admitted to [facility] 11/12/16   Widowed   Never smoked   Alcohol occ.   Full Code    Pertinent  Health Maintenance Due  Topic Date Due  . PNA vac Low Risk Adult (1 of 2 - PCV13) 05/17/1989  . INFLUENZA VACCINE  10/01/2016    Medications: Allergies as of 12/09/2016      Reactions   Aclidinium Bromide Itching, Other (See Comments)   Throat irritation      Medication List       Accurate as of 12/09/16  2:37 PM. Always use your most recent med list.          amLODipine 2.5 MG tablet Commonly known as:  NORVASC Take 2.5 mg by mouth daily.   colesevelam 625 MG tablet Commonly known as:  WELCHOL Take  1,875 mg by mouth 2 (two) times daily with a meal.   diphenhydramine-acetaminophen 25-500 MG Tabs tablet Commonly known as:  TYLENOL PM Take 2 tablets by mouth at bedtime.   fenofibrate 160 MG tablet Take 160 mg by mouth daily.   finasteride 5 MG tablet Commonly known as:  PROSCAR Take 5 mg by mouth daily.   fludrocortisone 0.1 MG tablet Commonly known as:  FLORINEF Take 0.1 mg by mouth daily.   fluticasone 50 MCG/ACT nasal spray Commonly known as:  FLONASE Place 1 spray into both nostrils daily.   furosemide 20 MG tablet Commonly known as:  LASIX Take 20 mg by mouth daily as needed for fluid.   guaifenesin 100 MG/5ML syrup Commonly known as:  ROBITUSSIN Take 200 mg by mouth every 6 (six) hours as needed for cough.   hydrocortisone 10 MG tablet Commonly known as:  CORTEF Take 1 tablet in the morning and half a tablet at night.   isosorbide mononitrate 60 MG 24 hr tablet Commonly known as:  IMDUR Take 60 mg by mouth daily.   levothyroxine 25 MCG tablet Commonly known as:  SYNTHROID, LEVOTHROID Take 25 mcg by mouth daily before breakfast.   metoprolol succinate 25 MG 24 hr tablet Commonly known as:  TOPROL-XL Take 25 mg by mouth daily.   ondansetron 4 MG tablet Commonly known as:  ZOFRAN Take 4 mg by mouth every 8 (eight) hours as needed for nausea or vomiting.   OXYGEN Inhale into the lungs. 2 liter 02 daily   pravastatin 20 MG tablet Commonly known as:  PRAVACHOL Take 20 mg by mouth daily.   sertraline 50 MG tablet Commonly known as:  ZOLOFT Take 50 mg by mouth daily.   SLOW-MAG PO Take 1 tablet by mouth daily.        Vitals:   12/09/16 1045  BP: (!) 169/82  Pulse: 64  Resp: 18  Temp: 97.8 F (36.6 C)  SpO2: 94%  Weight: 206 lb (93.4 kg)  Height: 5\' 10"  (1.778 m)   Body mass index is 29.56 kg/m.  Physical Exam  GENERAL APPEARANCE: Alert, conversant. No acute distress.  HEENT: Unremarkable. RESPIRATORY: Breathing is even, unlabored.  Lung sounds are clear   CARDIOVASCULAR: Heart RRR no murmurs, rubs or gallops. No peripheral edema.  GASTROINTESTINAL: Abdomen is soft, non-tender, not distended w/ normal bowel sounds.  NEUROLOGIC: Cranial nerves 2-12 grossly intact. Moves all extremities   Labs reviewed: Basic Metabolic Panel:  Recent Labs  11/20/16 0413 11/21/16 0957 11/22/16 0310  NA 143 144 143  K 3.2* 3.4* 3.8  CL 110 111 110  CO2 23 22 25   GLUCOSE 82 78 86  BUN 25* 29* 26*  CREATININE 1.84* 1.72* 1.68*  CALCIUM 8.3* 8.1* 8.2*   No results found for: [facility] Liver Function Tests:  Recent Labs  11/08/16 0815 11/20/16 0413 11/21/16 0957  AST 35 19 25  ALT 17 13* 14*  ALKPHOS 56 47 47  BILITOT 0.9 0.6 2.2*  PROT 6.1* 5.2* 5.2*  ALBUMIN 3.3* 2.5* 2.6*    Recent Labs  11/08/16 0815  LIPASE 56*   No results for input(s): AMMONIA in the last 8760 hours. CBC:  Recent Labs  11/08/16 0815  11/21/16 0957 11/22/16 0310 11/23/16 0339  WBC 10.4  < > 9.7 8.7 8.4  NEUTROABS 8.0*  --   --   --   --   HGB 12.6*  < > 8.7* 8.6* 8.7*  HCT 39.6  < > 25.0* 24.8* 25.2*  MCV 91.7  < > 89.0 90.8 92.0  PLT 256  < > 293 298 310  < > = values in this interval not displayed. Lipid No results for input(s): CHOL, HDL, LDLCALC, TRIG in the last 8760 hours. Cardiac Enzymes: No results for input(s): CKTOTAL, CKMB, CKMBINDEX, TROPONINI in the last 8760 hours. BNP: No results for input(s): BNP in the last 8760 hours. CBG:  Recent Labs  11/10/16 0757  GLUCAP 107*    Procedures and Imaging Studies During Stay: Dg Chest 2 View  Result Date: 11/10/2016 CLINICAL DATA:  Increase shortness of breath, pneumonia, history of COPD, coronary artery disease with stent placement, previous pulmonary embolism. EXAM: CHEST  2 VIEW COMPARISON:  Portable chest x-ray of November 09, 2016 FINDINGS: The lungs are adequately inflated. The interstitial markings remain increased with areas of patchy confluence in the right  mid lung and the left lung base. Pair radius small left pleural effusion. The cardiac silhouette remains enlarged. The central pulmonary vascularity is prominent. There is calcification in the wall of the thoracic aorta. The observed bony thorax exhibits no acute abnormality. IMPRESSION: Persistent increased lung markings as described worrisome for interstitial pneumonia. Underlying low-grade compensated CHF may be present. Overall there has not been significant interval change in the appearance of the chest since yesterday's study. Electronically Signed   By: David  Martinique M.D.   On: 11/10/2016 10:15   Nm Gi Blood Loss  Addendum Date: 11/20/2016   ADDENDUM REPORT: 11/20/2016 20:00 ADDENDUM: I discussed these findings by telephone with Dr. Allyson Sabal at approximately 1830 hours on 11/20/2016. Electronically Signed   By: Misty Stanley M.D.   On: 11/20/2016 20:00   Result Date: 11/20/2016 CLINICAL DATA:  GI bleed intermittent for 2 days. EXAM: NUCLEAR MEDICINE GASTROINTESTINAL BLEEDING SCAN TECHNIQUE: Sequential abdominal images were obtained following intravenous administration of Tc-55m labeled red blood cells. RADIOPHARMACEUTICALS:  27.4 mCi Tc-39m in-vitro labeled red cells. COMPARISON:  CT scan from 04/10/ 2017 FINDINGS: There is an area of extravascular tracer accumulation in the lower abdomen/pelvis with some linear retrograde movement during the exam. This collection appears to clear between the first and second hours of imaging and the second hour of planar imaging shows further accumulation of radiotracer in the same region, but to a greater degree. Some portions of the tracer accumulation on the second hour do clear but there is no gross migration of the apparent extra vascular radiotracer. Movement of the radiotracer is more apparent on the stacked cine images. Reviewing coronal imaging from the previous CT scan, this tracer accumulation is in the region of the patient's sigmoid  colon and has a similar  configuration. IMPRESSION: Imaging features are highly suspicious for active bleeding in the sigmoid colon. Electronically Signed: By: Misty Stanley M.D. On: 11/20/2016 18:23    Assessment/Plan:   Lower GI bleed  Diverticulitis of colon with bleeding  Supratherapeutic INR  History of adrenal insufficiency  Anemia due to acute blood loss  Other chronic pulmonary embolism without acute cor pulmonale (HCC)  Essential hypertension  Coronary artery disease involving native coronary artery of native heart without angina pectoris  Acquired hypothyroidism  Acute renal failure superimposed on stage 4 chronic kidney disease, unspecified acute renal failure type (H[facility])  Hypoxia  Chronic obstructive pulmonary disease, unspecified COPD type ([place])   Patient is being discharged with the following home health services:  OT/PT/nursing  Patient is being discharged with the following durable medical equipment:  Home O2 2 L  Patient has been advised to f/u with their PCP in 1-2 weeks to bring them up to date on their rehab stay.  Social services at facility was responsible for arranging this appointment.  Pt was provided with a 30 day supply of prescriptions for medications and refills must be obtained from their PCP.  For controlled substances, a more limited supply may be provided adequate until PCP appointment only.  Medications have been reconciled.   Time spent greater than 30 minutes;> 50% of time with patient was spent reviewing records, labs, tests and studies, counseling and developing plan of care  Noah Delaine. Sheppard Coil, MD

## 2016-12-12 DIAGNOSIS — K5731 Diverticulosis of large intestine without perforation or abscess with bleeding: Secondary | ICD-10-CM

## 2016-12-12 DIAGNOSIS — J690 Pneumonitis due to inhalation of food and vomit: Secondary | ICD-10-CM

## 2016-12-12 DIAGNOSIS — J8410 Pulmonary fibrosis, unspecified: Secondary | ICD-10-CM

## 2016-12-12 DIAGNOSIS — I2510 Atherosclerotic heart disease of native coronary artery without angina pectoris: Secondary | ICD-10-CM | POA: Diagnosis not present

## 2016-12-12 DIAGNOSIS — I951 Orthostatic hypotension: Secondary | ICD-10-CM | POA: Diagnosis not present

## 2016-12-12 DIAGNOSIS — N184 Chronic kidney disease, stage 4 (severe): Secondary | ICD-10-CM | POA: Diagnosis not present

## 2016-12-12 DIAGNOSIS — J449 Chronic obstructive pulmonary disease, unspecified: Secondary | ICD-10-CM

## 2016-12-12 DIAGNOSIS — I129 Hypertensive chronic kidney disease with stage 1 through stage 4 chronic kidney disease, or unspecified chronic kidney disease: Secondary | ICD-10-CM | POA: Diagnosis not present

## 2016-12-25 ENCOUNTER — Other Ambulatory Visit (HOSPITAL_COMMUNITY)
Admission: RE | Admit: 2016-12-25 | Discharge: 2016-12-25 | Disposition: A | Discharge status: Home / Self Care | Payer: Medicare Other | Source: Other Acute Inpatient Hospital | Attending: Family Medicine | Admitting: Family Medicine

## 2016-12-25 DIAGNOSIS — I10 Essential (primary) hypertension: Secondary | ICD-10-CM | POA: Diagnosis present

## 2016-12-25 LAB — BASIC METABOLIC PANEL
Anion gap: 9 (ref 5–15)
BUN: 28 mg/dL — AB (ref 6–20)
CALCIUM: 8.8 mg/dL — AB (ref 8.9–10.3)
CO2: 24 mmol/L (ref 22–32)
CREATININE: 2.06 mg/dL — AB (ref 0.61–1.24)
Chloride: 104 mmol/L (ref 101–111)
GFR calc non Af Amer: 26 mL/min — ABNORMAL LOW (ref >60)
GFR, EST AFRICAN AMERICAN: 31 mL/min — AB (ref >60)
Glucose, Bld: 78 mg/dL (ref 65–99)
Potassium: 3.7 mmol/L (ref 3.5–5.1)
Sodium: 137 mmol/L (ref 135–145)

## 2018-12-07 IMAGING — CT CT ABD-PELV W/O CM
2 of 4 series · 16 of 46 positions shown, 18 images · non-contrast
Comparison: CT abdomen pelvis 06/11/2015

CLINICAL DATA: Patient with vomiting.  Back pain.

EXAM:
CT ABDOMEN AND PELVIS WITHOUT CONTRAST
TECHNIQUE: Multidetector CT imaging of the abdomen and pelvis was performed
following the standard protocol without IV contrast.

[Series 3: ap without · axial · non-contrast · 0.82mm/px · z∈[+796,+1246]mm · 13 of 102 slices shown, 15 images]
[im 6/102  soft-tissue]
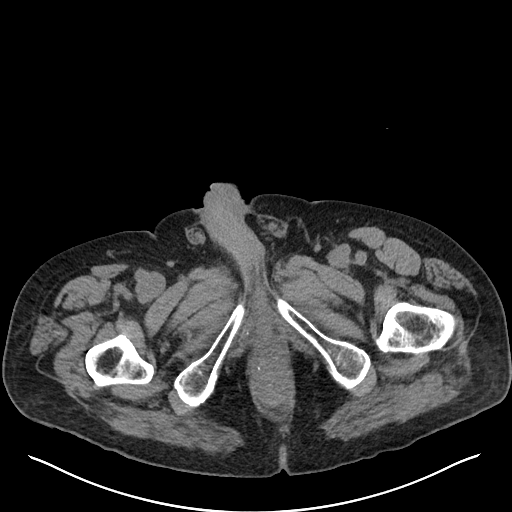
[im 6/102  bone]
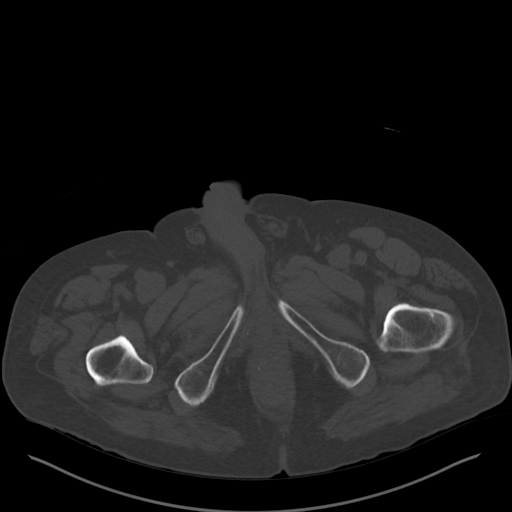
[im 16/102  soft-tissue]
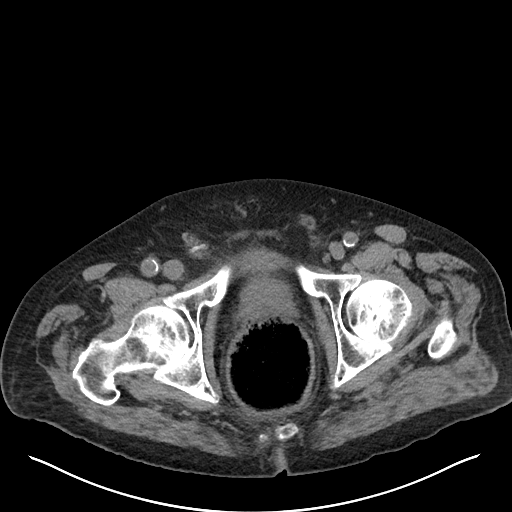
[im 22/102  soft-tissue]
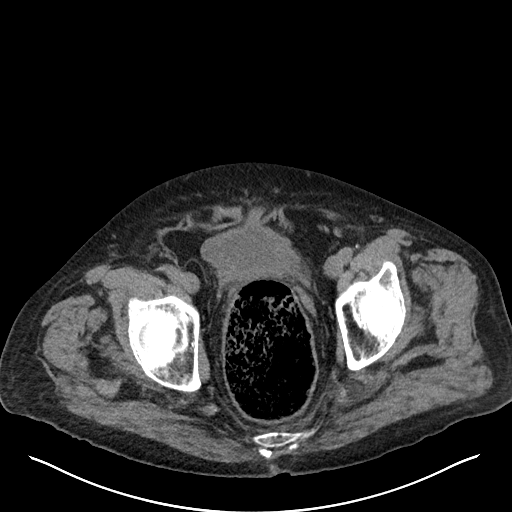
[im 27/102  soft-tissue]
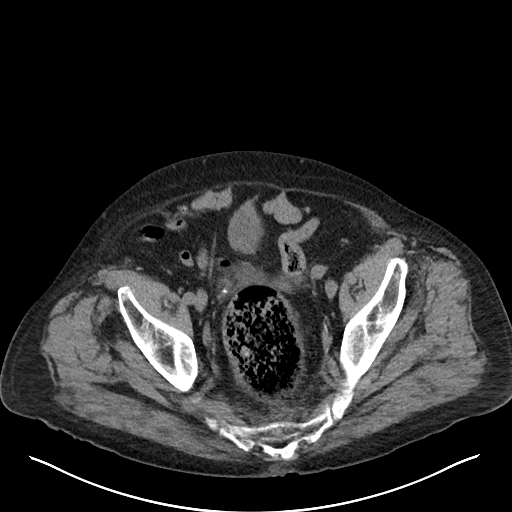
[im 38/102  soft-tissue]
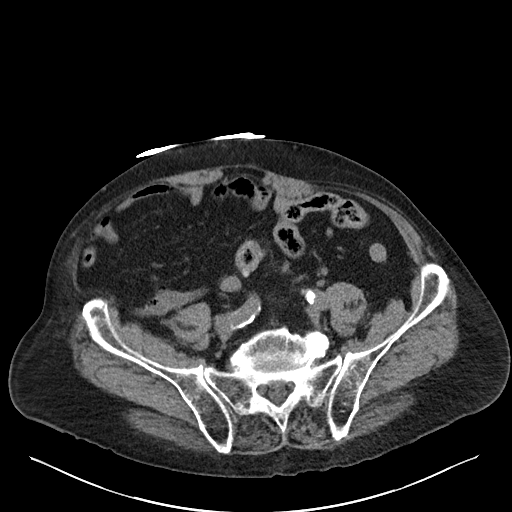
[im 43/102  soft-tissue]
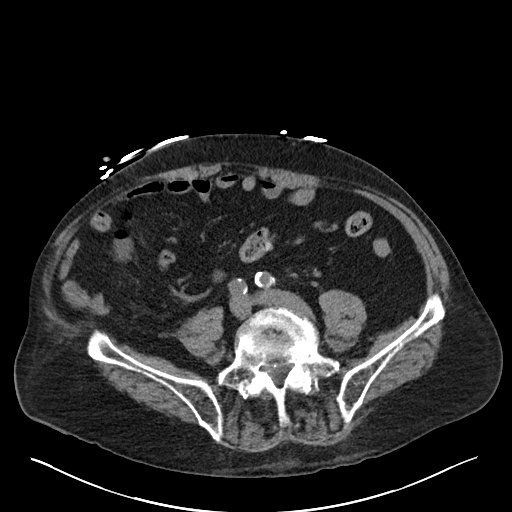
[im 54/102  soft-tissue]
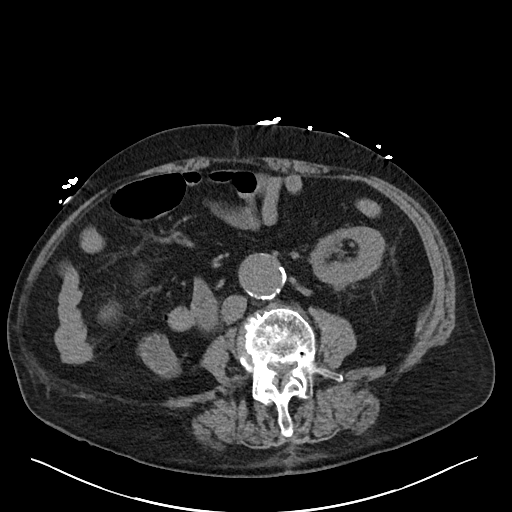
[im 59/102  soft-tissue]
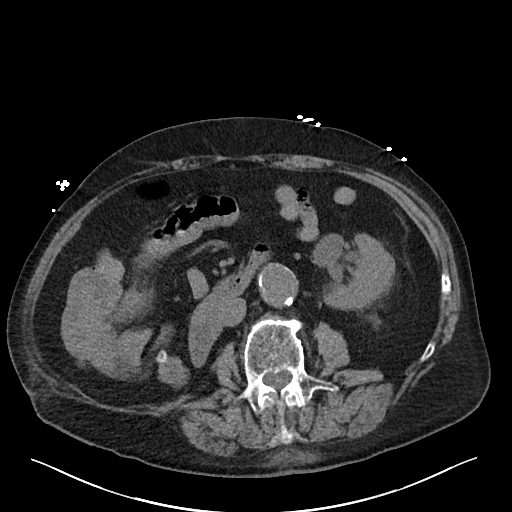
[im 64/102  soft-tissue]
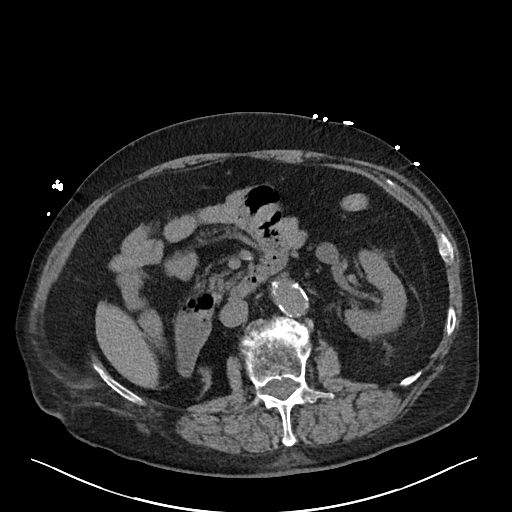
[im 64/102  bone]
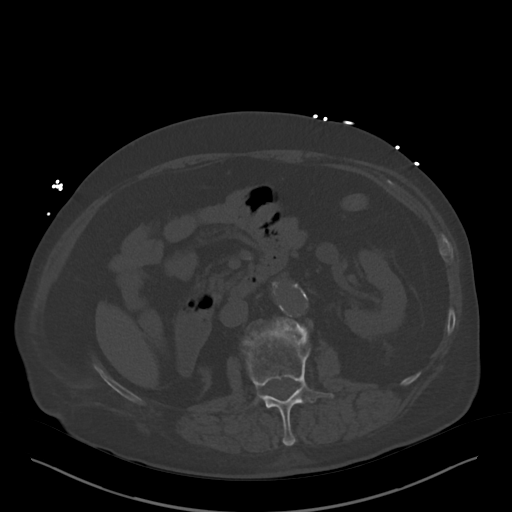
[im 75/102  soft-tissue]
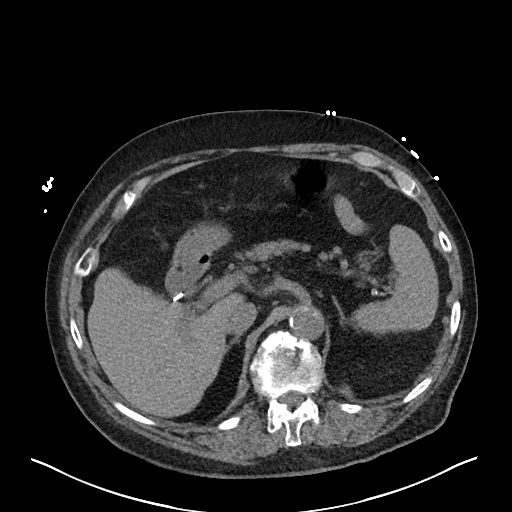
[im 80/102  soft-tissue]
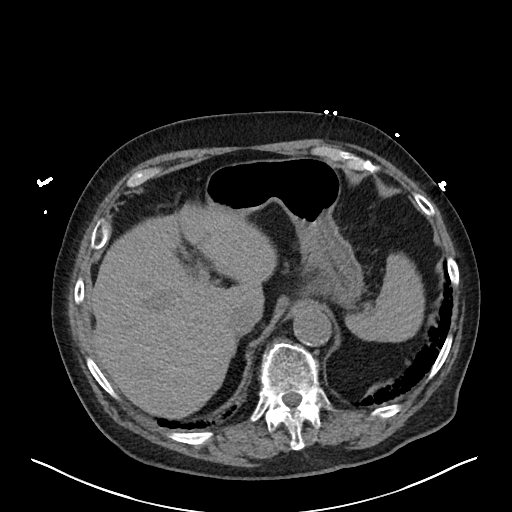
[im 86/102  soft-tissue]
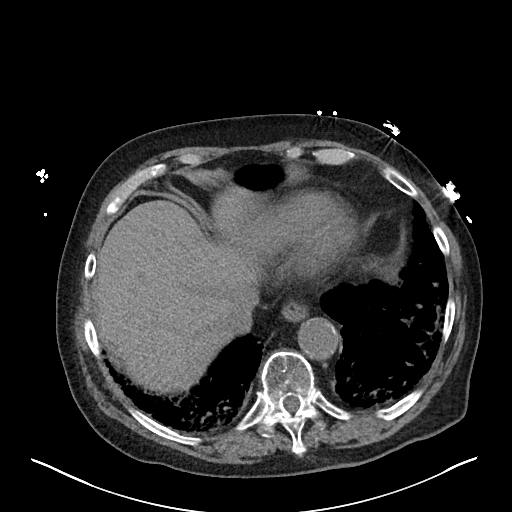
[im 96/102  soft-tissue]
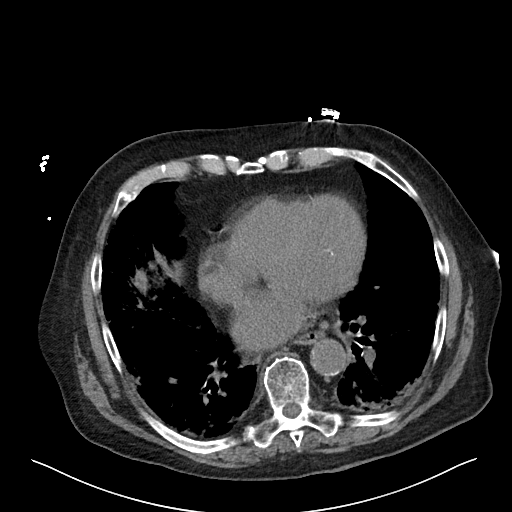

[Series 6: cor · coronal · 0.78mm/px · 3 of 108 slices shown]
[im 36/108  soft-tissue]
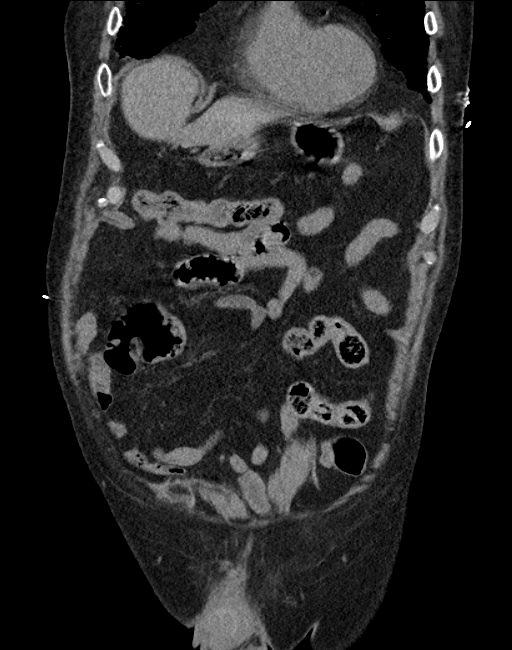
[im 48/108  soft-tissue]
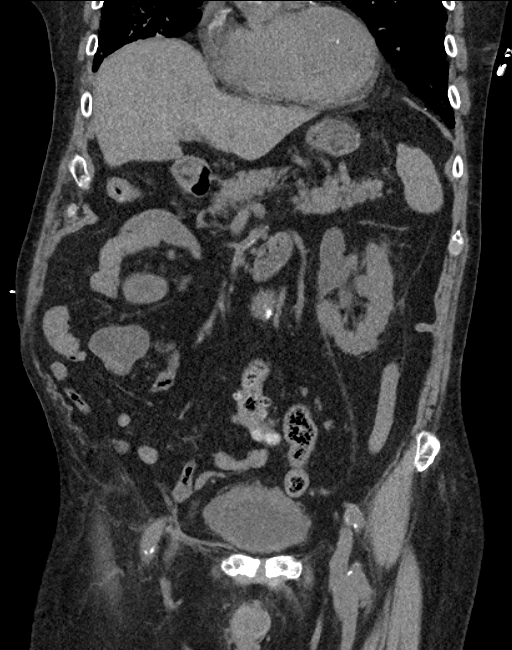
[im 60/108  soft-tissue]
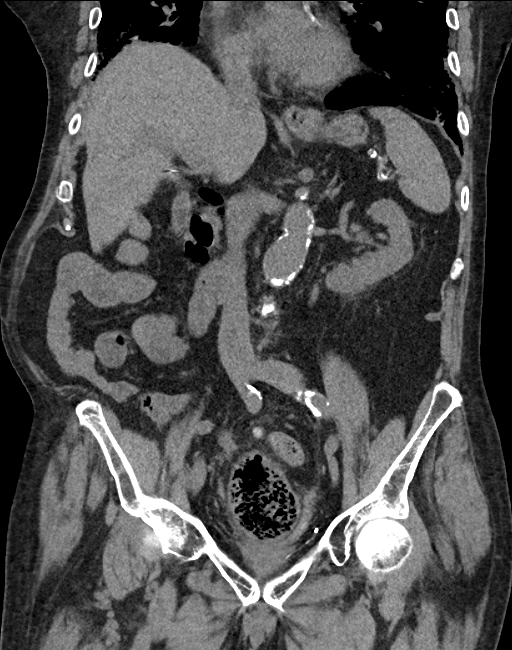

[16 of 46 positions shown; findings below may reference images not displayed]

FINDINGS: Lower chest: Heart is mildly enlarged. Coronary arterial vascular
calcifications. Interval progression of subpleural reticular
opacities with associated architectural distortion. No pleural
effusion.

Hepatobiliary: Liver is normal in size and contour. Status post
cholecystectomy.

Pancreas: Unremarkable.

Spleen: Unremarkable

Adrenals/Urinary Tract: The adrenal glands are normal. Right kidney
is surgically absent. Stable appearance of the surgical bed. Stable
appearance of the left kidney. No hydronephrosis. Mild urinary
bladder wall thickening.

Stomach/Bowel: Large amount of stool within the rectum. Descending
and sigmoid colonic diverticulosis. No CT evidence for acute
diverticulitis.

Vascular/Lymphatic: Peripheral calcified atherosclerotic plaque
involving the abdominal aorta. Infrarenal abdominal aortic ectasia
measuring 3.8 cm, previously 3.5 cm. No retroperitoneal
lymphadenopathy.

Reproductive: Prostate unremarkable.

Other: None.

Musculoskeletal: Thoracic and lumbar spine degenerative changes. No
aggressive or acute appearing osseous lesions.
IMPRESSION: Large amount of stool within the rectum compatible with
constipation.

Descending and sigmoid colonic diverticulosis without CT evidence
for acute diverticulitis.

Infrarenal abdominal aortic aneurysm, slightly increased from prior
measuring 3.8 cm. Recommend followup by ultrasound in 2 years. This
recommendation follows ACR consensus guidelines: White Paper of the
ACR Incidental Findings Committee II on Vascular Findings. [HOSPITAL] 9216; [DATE].

Interval progression of subpleural reticular opacities and
architectural distortion most compatible with progressed pulmonary
fibrotic change. In the nonacute setting, recommend further
evaluation with high-resolution chest CT.

Mild urinary bladder wall thickening. Recommend correlation with
urinalysis as infection not excluded.

## 2018-12-21 ENCOUNTER — Other Ambulatory Visit: Payer: Self-pay

## 2018-12-21 ENCOUNTER — Inpatient Hospital Stay (HOSPITAL_COMMUNITY)
Admission: EM | Admit: 2018-12-21 | Discharge: 2018-12-29 | DRG: 871 | Disposition: A | Discharge status: Hospice | Payer: Medicare Other | Attending: Internal Medicine | Admitting: Internal Medicine

## 2018-12-21 ENCOUNTER — Inpatient Hospital Stay (HOSPITAL_COMMUNITY): Payer: Medicare Other

## 2018-12-21 ENCOUNTER — Encounter (HOSPITAL_COMMUNITY): Payer: Self-pay | Admitting: Emergency Medicine

## 2018-12-21 ENCOUNTER — Emergency Department (HOSPITAL_COMMUNITY): Payer: Medicare Other

## 2018-12-21 DIAGNOSIS — Z515 Encounter for palliative care: Secondary | ICD-10-CM | POA: Diagnosis not present

## 2018-12-21 DIAGNOSIS — J189 Pneumonia, unspecified organism: Secondary | ICD-10-CM

## 2018-12-21 DIAGNOSIS — I129 Hypertensive chronic kidney disease with stage 1 through stage 4 chronic kidney disease, or unspecified chronic kidney disease: Secondary | ICD-10-CM | POA: Diagnosis present

## 2018-12-21 DIAGNOSIS — I248 Other forms of acute ischemic heart disease: Secondary | ICD-10-CM | POA: Diagnosis present

## 2018-12-21 DIAGNOSIS — Z905 Acquired absence of kidney: Secondary | ICD-10-CM

## 2018-12-21 DIAGNOSIS — A419 Sepsis, unspecified organism: Principal | ICD-10-CM | POA: Diagnosis present

## 2018-12-21 DIAGNOSIS — R0600 Dyspnea, unspecified: Secondary | ICD-10-CM

## 2018-12-21 DIAGNOSIS — Z20828 Contact with and (suspected) exposure to other viral communicable diseases: Secondary | ICD-10-CM | POA: Diagnosis present

## 2018-12-21 DIAGNOSIS — N184 Chronic kidney disease, stage 4 (severe): Secondary | ICD-10-CM | POA: Diagnosis present

## 2018-12-21 DIAGNOSIS — E039 Hypothyroidism, unspecified: Secondary | ICD-10-CM | POA: Diagnosis present

## 2018-12-21 DIAGNOSIS — R0602 Shortness of breath: Secondary | ICD-10-CM

## 2018-12-21 DIAGNOSIS — R52 Pain, unspecified: Secondary | ICD-10-CM | POA: Diagnosis not present

## 2018-12-21 DIAGNOSIS — Z9981 Dependence on supplemental oxygen: Secondary | ICD-10-CM

## 2018-12-21 DIAGNOSIS — R4182 Altered mental status, unspecified: Secondary | ICD-10-CM | POA: Diagnosis present

## 2018-12-21 DIAGNOSIS — Z87891 Personal history of nicotine dependence: Secondary | ICD-10-CM

## 2018-12-21 DIAGNOSIS — R6520 Severe sepsis without septic shock: Secondary | ICD-10-CM | POA: Diagnosis present

## 2018-12-21 DIAGNOSIS — Z8639 Personal history of other endocrine, nutritional and metabolic disease: Secondary | ICD-10-CM | POA: Diagnosis not present

## 2018-12-21 DIAGNOSIS — Z79899 Other long term (current) drug therapy: Secondary | ICD-10-CM

## 2018-12-21 DIAGNOSIS — G9341 Metabolic encephalopathy: Secondary | ICD-10-CM | POA: Diagnosis present

## 2018-12-21 DIAGNOSIS — Z7982 Long term (current) use of aspirin: Secondary | ICD-10-CM

## 2018-12-21 DIAGNOSIS — I160 Hypertensive urgency: Secondary | ICD-10-CM | POA: Diagnosis present

## 2018-12-21 DIAGNOSIS — J690 Pneumonitis due to inhalation of food and vomit: Secondary | ICD-10-CM | POA: Diagnosis present

## 2018-12-21 DIAGNOSIS — I2782 Chronic pulmonary embolism: Secondary | ICD-10-CM | POA: Diagnosis present

## 2018-12-21 DIAGNOSIS — Z7189 Other specified counseling: Secondary | ICD-10-CM | POA: Diagnosis not present

## 2018-12-21 DIAGNOSIS — E785 Hyperlipidemia, unspecified: Secondary | ICD-10-CM | POA: Diagnosis present

## 2018-12-21 DIAGNOSIS — J449 Chronic obstructive pulmonary disease, unspecified: Secondary | ICD-10-CM | POA: Diagnosis present

## 2018-12-21 DIAGNOSIS — K219 Gastro-esophageal reflux disease without esophagitis: Secondary | ICD-10-CM | POA: Diagnosis present

## 2018-12-21 DIAGNOSIS — R338 Other retention of urine: Secondary | ICD-10-CM

## 2018-12-21 DIAGNOSIS — I2510 Atherosclerotic heart disease of native coronary artery without angina pectoris: Secondary | ICD-10-CM | POA: Diagnosis present

## 2018-12-21 DIAGNOSIS — Z7952 Long term (current) use of systemic steroids: Secondary | ICD-10-CM | POA: Diagnosis not present

## 2018-12-21 DIAGNOSIS — Z79890 Hormone replacement therapy (postmenopausal): Secondary | ICD-10-CM | POA: Diagnosis not present

## 2018-12-21 DIAGNOSIS — I252 Old myocardial infarction: Secondary | ICD-10-CM

## 2018-12-21 DIAGNOSIS — N179 Acute kidney failure, unspecified: Secondary | ICD-10-CM | POA: Diagnosis present

## 2018-12-21 DIAGNOSIS — Z66 Do not resuscitate: Secondary | ICD-10-CM | POA: Diagnosis present

## 2018-12-21 DIAGNOSIS — Z955 Presence of coronary angioplasty implant and graft: Secondary | ICD-10-CM

## 2018-12-21 DIAGNOSIS — Z781 Physical restraint status: Secondary | ICD-10-CM

## 2018-12-21 DIAGNOSIS — J8410 Pulmonary fibrosis, unspecified: Secondary | ICD-10-CM | POA: Diagnosis not present

## 2018-12-21 DIAGNOSIS — J9621 Acute and chronic respiratory failure with hypoxia: Secondary | ICD-10-CM | POA: Diagnosis present

## 2018-12-21 DIAGNOSIS — K5641 Fecal impaction: Secondary | ICD-10-CM | POA: Diagnosis present

## 2018-12-21 DIAGNOSIS — Z23 Encounter for immunization: Secondary | ICD-10-CM | POA: Diagnosis not present

## 2018-12-21 DIAGNOSIS — R451 Restlessness and agitation: Secondary | ICD-10-CM

## 2018-12-21 DIAGNOSIS — N401 Enlarged prostate with lower urinary tract symptoms: Secondary | ICD-10-CM | POA: Diagnosis present

## 2018-12-21 DIAGNOSIS — R5381 Other malaise: Secondary | ICD-10-CM | POA: Diagnosis present

## 2018-12-21 LAB — URINALYSIS, ROUTINE W REFLEX MICROSCOPIC
Bilirubin Urine: NEGATIVE
Glucose, UA: NEGATIVE mg/dL
Hgb urine dipstick: NEGATIVE
Ketones, ur: NEGATIVE mg/dL
Leukocytes,Ua: NEGATIVE
Nitrite: NEGATIVE
Protein, ur: 100 mg/dL — AB
Specific Gravity, Urine: 1.013 (ref 1.005–1.030)
pH: 5 (ref 5.0–8.0)

## 2018-12-21 LAB — CBC WITH DIFFERENTIAL/PLATELET
Abs Immature Granulocytes: 0.07 10*3/uL (ref 0.00–0.07)
Basophils Absolute: 0.1 10*3/uL (ref 0.0–0.1)
Basophils Relative: 0 %
Eosinophils Absolute: 0.1 10*3/uL (ref 0.0–0.5)
Eosinophils Relative: 1 %
HCT: 38.5 % — ABNORMAL LOW (ref 39.0–52.0)
Hemoglobin: 12.2 g/dL — ABNORMAL LOW (ref 13.0–17.0)
Immature Granulocytes: 1 %
Lymphocytes Relative: 10 %
Lymphs Abs: 1.4 10*3/uL (ref 0.7–4.0)
MCH: 30 pg (ref 26.0–34.0)
MCHC: 31.7 g/dL (ref 30.0–36.0)
MCV: 94.8 fL (ref 80.0–100.0)
Monocytes Absolute: 0.8 10*3/uL (ref 0.1–1.0)
Monocytes Relative: 6 %
Neutro Abs: 11.1 10*3/uL — ABNORMAL HIGH (ref 1.7–7.7)
Neutrophils Relative %: 82 %
Platelets: 222 10*3/uL (ref 150–400)
RBC: 4.06 MIL/uL — ABNORMAL LOW (ref 4.22–5.81)
RDW: 14.4 % (ref 11.5–15.5)
WBC: 13.6 10*3/uL — ABNORMAL HIGH (ref 4.0–10.5)
nRBC: 0 % (ref 0.0–0.2)

## 2018-12-21 LAB — ECHOCARDIOGRAM COMPLETE
Height: 72 in
Weight: 3294.55 oz

## 2018-12-21 LAB — COMPREHENSIVE METABOLIC PANEL
ALT: 13 U/L (ref 0–44)
AST: 24 U/L (ref 15–41)
Albumin: 3.5 g/dL (ref 3.5–5.0)
Alkaline Phosphatase: 81 U/L (ref 38–126)
Anion gap: 11 (ref 5–15)
BUN: 34 mg/dL — ABNORMAL HIGH (ref 8–23)
CO2: 24 mmol/L (ref 22–32)
Calcium: 8.6 mg/dL — ABNORMAL LOW (ref 8.9–10.3)
Chloride: 104 mmol/L (ref 98–111)
Creatinine, Ser: 2.16 mg/dL — ABNORMAL HIGH (ref 0.61–1.24)
GFR calc Af Amer: 29 mL/min — ABNORMAL LOW (ref >60)
GFR calc non Af Amer: 25 mL/min — ABNORMAL LOW (ref >60)
Glucose, Bld: 108 mg/dL — ABNORMAL HIGH (ref 70–99)
Potassium: 3.4 mmol/L — ABNORMAL LOW (ref 3.5–5.1)
Sodium: 139 mmol/L (ref 135–145)
Total Bilirubin: 0.8 mg/dL (ref 0.3–1.2)
Total Protein: 6.7 g/dL (ref 6.5–8.1)

## 2018-12-21 LAB — TRIGLYCERIDES: Triglycerides: 129 mg/dL (ref <150)

## 2018-12-21 LAB — LACTATE DEHYDROGENASE: LDH: 189 U/L (ref 98–192)

## 2018-12-21 LAB — FERRITIN: Ferritin: 35 ng/mL (ref 24–336)

## 2018-12-21 LAB — STREP PNEUMONIAE URINARY ANTIGEN: Strep Pneumo Urinary Antigen: NEGATIVE

## 2018-12-21 LAB — D-DIMER, QUANTITATIVE: D-Dimer, Quant: 6.36 ug/mL-FEU — ABNORMAL HIGH (ref 0.00–0.50)

## 2018-12-21 LAB — LACTIC ACID, PLASMA
Lactic Acid, Venous: 2.6 mmol/L (ref 0.5–1.9)
Lactic Acid, Venous: 3.9 mmol/L (ref 0.5–1.9)
Lactic Acid, Venous: 2.5 mmol/L (ref 0.5–1.9)
Lactic Acid, Venous: 3.1 mmol/L (ref 0.5–1.9)

## 2018-12-21 LAB — SARS CORONAVIRUS 2 BY RT PCR (HOSPITAL ORDER, PERFORMED IN ~~LOC~~ HOSPITAL LAB): SARS Coronavirus 2: NEGATIVE

## 2018-12-21 LAB — MRSA PCR SCREENING: MRSA by PCR: NEGATIVE

## 2018-12-21 LAB — CORTISOL: Cortisol, Plasma: 16.8 ug/dL

## 2018-12-21 LAB — PROCALCITONIN: Procalcitonin: <0.1 ng/mL

## 2018-12-21 LAB — TROPONIN I (HIGH SENSITIVITY)
Troponin I (High Sensitivity): 725 ng/L (ref <18)
Troponin I (High Sensitivity): 1965 ng/L (ref <18)

## 2018-12-21 LAB — FIBRINOGEN: Fibrinogen: 375 mg/dL (ref 210–475)

## 2018-12-21 LAB — BRAIN NATRIURETIC PEPTIDE: B Natriuretic Peptide: 512.6 pg/mL — ABNORMAL HIGH (ref 0.0–100.0)

## 2018-12-21 LAB — C-REACTIVE PROTEIN: CRP: 0.8 mg/dL (ref <1.0)

## 2018-12-21 MED ORDER — ACETAMINOPHEN 650 MG RE SUPP
650.0000 mg | Freq: Four times a day (QID) | RECTAL | Status: DC | PRN
  Administered 2018-12-22 (×2): 650 mg via RECTAL
  Filled 2018-12-21 (×2): qty 1

## 2018-12-21 MED ORDER — PROMETHAZINE HCL 25 MG/ML IJ SOLN
12.5000 mg | Freq: Four times a day (QID) | INTRAMUSCULAR | Status: DC | PRN
  Administered 2018-12-21 (×2): 12.5 mg via INTRAVENOUS
  Filled 2018-12-21 (×2): qty 1

## 2018-12-21 MED ORDER — ORAL CARE MOUTH RINSE
15.0000 mL | Freq: Two times a day (BID) | OROMUCOSAL | Status: DC
  Administered 2018-12-21 – 2018-12-29 (×15): 15 mL via OROMUCOSAL

## 2018-12-21 MED ORDER — CHLORHEXIDINE GLUCONATE CLOTH 2 % EX PADS
6.0000 | MEDICATED_PAD | Freq: Every day | CUTANEOUS | Status: DC
  Administered 2018-12-21 – 2018-12-29 (×9): 6 via TOPICAL

## 2018-12-21 MED ORDER — SODIUM CHLORIDE 0.9 % IV SOLN
2.0000 g | INTRAVENOUS | Status: DC
  Administered 2018-12-22 – 2018-12-27 (×6): 2 g via INTRAVENOUS
  Filled 2018-12-21 (×8): qty 2

## 2018-12-21 MED ORDER — SODIUM CHLORIDE 0.9 % IV SOLN
2.0000 g | Freq: Two times a day (BID) | INTRAVENOUS | Status: DC
  Administered 2018-12-21: 14:00:00 2 g via INTRAVENOUS

## 2018-12-21 MED ORDER — ACETAMINOPHEN 325 MG PO TABS: 650.0000 mg | ORAL_TABLET | Freq: Four times a day (QID) | ORAL | Status: DC | PRN

## 2018-12-21 MED ORDER — VANCOMYCIN HCL 10 G IV SOLR
1500.0000 mg | INTRAVENOUS | Status: DC
  Administered 2018-12-23 – 2018-12-25 (×2): 1500 mg via INTRAVENOUS
  Filled 2018-12-21 (×2): qty 1500

## 2018-12-21 MED ORDER — SODIUM CHLORIDE 0.9 % IV BOLUS: 500.0000 mL | Freq: Once | INTRAVENOUS | Status: DC

## 2018-12-21 MED ORDER — ALBUTEROL SULFATE HFA 108 (90 BASE) MCG/ACT IN AERS
2.0000 | INHALATION_SPRAY | Freq: Once | RESPIRATORY_TRACT | Status: AC
  Administered 2018-12-21: 09:00:00 2 via RESPIRATORY_TRACT
  Filled 2018-12-21: qty 6.7

## 2018-12-21 MED ORDER — ACETAMINOPHEN 500 MG PO TABS
500.0000 mg | ORAL_TABLET | Freq: Once | ORAL | Status: AC
  Administered 2018-12-21: 500 mg via ORAL
  Filled 2018-12-21: qty 1

## 2018-12-21 MED ORDER — SODIUM CHLORIDE 0.9 % IV BOLUS
500.0000 mL | Freq: Once | INTRAVENOUS | Status: AC
  Administered 2018-12-21: 500 mL via INTRAVENOUS

## 2018-12-21 MED ORDER — HYDROCORTISONE NA SUCCINATE PF 100 MG IJ SOLR
50.0000 mg | Freq: Four times a day (QID) | INTRAMUSCULAR | Status: DC
  Administered 2018-12-21 – 2018-12-22 (×3): 50 mg via INTRAVENOUS
  Filled 2018-12-21 (×3): qty 2

## 2018-12-21 MED ORDER — VANCOMYCIN HCL 10 G IV SOLR
2000.0000 mg | Freq: Once | INTRAVENOUS | Status: AC
  Administered 2018-12-21: 2000 mg via INTRAVENOUS
  Filled 2018-12-21: qty 2000

## 2018-12-21 MED ORDER — PROMETHAZINE HCL 25 MG/ML IJ SOLN
12.5000 mg | Freq: Once | INTRAMUSCULAR | Status: AC
  Administered 2018-12-21: 09:00:00 12.5 mg via INTRAVENOUS
  Filled 2018-12-21: qty 1

## 2018-12-21 MED ORDER — SODIUM CHLORIDE 0.9 % IV SOLN
500.0000 mg | INTRAVENOUS | Status: DC
  Administered 2018-12-21: 500 mg via INTRAVENOUS
  Filled 2018-12-21: qty 500

## 2018-12-21 MED ORDER — LEVOTHYROXINE SODIUM 100 MCG/5ML IV SOLN
35.0000 ug | Freq: Every day | INTRAVENOUS | Status: DC
  Administered 2018-12-21 – 2018-12-22 (×2): 35 ug via INTRAVENOUS
  Filled 2018-12-21 (×2): qty 5

## 2018-12-21 MED ORDER — FLEET ENEMA 7-19 GM/118ML RE ENEM: 1.0000 | ENEMA | Freq: Once | RECTAL | Status: DC | PRN

## 2018-12-21 MED ORDER — SODIUM CHLORIDE 0.9 % IV BOLUS
1000.0000 mL | Freq: Once | INTRAVENOUS | Status: AC
  Administered 2018-12-21: 1000 mL via INTRAVENOUS

## 2018-12-21 MED ORDER — PROMETHAZINE HCL 25 MG PO TABS: 12.5000 mg | ORAL_TABLET | Freq: Four times a day (QID) | ORAL | Status: DC | PRN

## 2018-12-21 MED ORDER — SODIUM CHLORIDE 0.9 % IV SOLN
2.0000 g | INTRAVENOUS | Status: DC
  Administered 2018-12-21: 2 g via INTRAVENOUS
  Filled 2018-12-21: qty 20

## 2018-12-21 MED ORDER — HEPARIN SODIUM (PORCINE) 5000 UNIT/ML IJ SOLN
5000.0000 [IU] | Freq: Three times a day (TID) | INTRAMUSCULAR | Status: DC
  Administered 2018-12-21 – 2018-12-28 (×19): 5000 [IU] via SUBCUTANEOUS
  Filled 2018-12-21 (×19): qty 1

## 2018-12-21 MED ORDER — HALOPERIDOL LACTATE 5 MG/ML IJ SOLN
2.5000 mg | Freq: Once | INTRAMUSCULAR | Status: AC
  Administered 2018-12-21: 2.5 mg via INTRAVENOUS
  Filled 2018-12-21: qty 1

## 2018-12-21 MED ORDER — SENNA 8.6 MG PO TABS
1.0000 | ORAL_TABLET | Freq: Two times a day (BID) | ORAL | Status: DC
  Filled 2018-12-21 (×2): qty 1

## 2018-12-21 NOTE — Progress Notes (Signed)
Patient extremely restless and agitated and trying to get out of bed. Redirection is not effective due to patients hearing limitations and the use of airborne precautions creating extra ambient noise and patients altered mental status intermittently. Pt appears to be intermittently confused, removing oxygen, telemetry leads, and pulling at his foley catheter. Mittens have been placed to help prevent the patient from pulling at devices, however these have been ineffective thus far. Blood pressure is reading elevated, however it is unclear if this is due to agitation or true elevation. MD notified.

## 2018-12-21 NOTE — H&P (Addendum)
History and Physical  Leonard Shepard [id number] DOB: February 18, 1923 DOA: 12/21/2018   Patient coming from: Home & is able to ambulate  Chief Complaint:   HPI: Leonard Shepard is a 85 y.o. male with medical history significant for CAD, pulmonary fibrosis on home O2, chronic PE no longer on coumadin due to severe GI bleed in 2018, HTN, HLD, solitary kidney s/p R nephrectomy, CKD stage IV who presents to the ED complaining of chills, projectile vomiting, worsening shortness of breath, with sats in the 60s, that started last night prior to arrival.  Of note, patient son recently tested positive for COVID-19, about 10 days ago, and he has been living in the same house, but trying to stay away from each other.  Of note, patient has had similar episode back in 2018 where he was treated for recurrent aspiration pneumonia.  Patient does have a history of pulmonary fibrosis and follows with pulmonary, currently on about 3 L of oxygen at home.  Prior to all of this illness, patient was somewhat independent.  Patient currently very lethargic and confused.   ED Course: Febrile 102.4, tachycardic, tachypneic, desaturating to the 70s-80s on room air, hypertensive. Labs showed creatinine 2.16 at baseline, BNP 512.6, troponin 725-->1965, EKG with no acute ST changes, CRP 0.8, ferritin 35, lactic acid elevated, procalcitonin <0.10, WBC 13.6, D-dimer 6.36, COVID-19 negative, chest x-ray showed right upper lobe and bibasilar airspace disease most worrisome for pneumonia.  CT chest showed pneumonia with superimposed chronic interstitial lung disease.  Abdominal x-ray showed moderately large stool ball in the rectosigmoid colon.  Review of Systems: Review of systems are otherwise negative   Past Medical History:  Diagnosis Date   Acute renal failure superimposed on stage 4 chronic kidney disease ([place]) 11/16/2016   Anemia due to acute blood loss    Aspiration pneumonia (HCC)    BPH (benign prostatic hyperplasia)      CAD (coronary artery disease)    s/p stent   Chronic renal insufficiency, stage III (moderate)    CKD (chronic kidney disease)    COPD (chronic obstructive pulmonary disease) (HCC)    Depression    Diverticulitis of colon with bleeding    GERD (gastroesophageal reflux disease)    H/O unilateral nephrectomy    Heart attack ([place])    HLD (hyperlipidemia)    Lower GI bleed 11/20/2016   Oropharyngeal dysphagia 11/10/2016   PE (pulmonary embolism)    Postinflammatory pulmonary fibrosis (HCC)    Rotator cuff tear, right    Past Surgical History:  Procedure Laterality Date   APPENDECTOMY     CHOLECYSTECTOMY     CIRCUMCISION     COLONOSCOPY     heart stint     INGUINAL HERNIA REPAIR Right    KNEE SURGERY Right    Pt unsure of what the Dr did, his knee had collasped on him   lumbar back surgery     x 2   NEPHRECTOMY Right    3 surgeries on this kidney before finally removing it   SHOULDER SURGERY Left    for spurs   TONSILLECTOMY AND ADENOIDECTOMY      Social History:  reports that he has quit smoking. His smoking use included cigarettes and pipe. He has never used smokeless tobacco. He reports current alcohol use. He reports that he does not use drugs.   Allergies  Allergen Reactions   Aclidinium Bromide Itching and Other (See Comments)    Throat irritation    Family History  Problem  Relation Age of Onset   Lung disease Father    Leukemia Brother       Prior to Admission medications   Medication Sig Start Date End Date Taking? Authorizing Provider  amLODipine (NORVASC) 5 MG tablet Take 5 mg by mouth daily. 05/13/18  Yes [provider]  aspirin 81 MG chewable tablet Chew 81 mg by mouth daily.   Yes [provider]  diphenhydramine-acetaminophen (TYLENOL PM) 25-500 MG TABS tablet Take 2 tablets by mouth at bedtime as needed (sleep).   Yes [provider]  docusate sodium (COLACE) 100 MG capsule Take 100 mg by mouth  2 (two) times daily as needed for mild constipation.   Yes [provider]  fludrocortisone (FLORINEF) 0.1 MG tablet Take 0.1 mg by mouth daily.   Yes [provider]  fluticasone (FLONASE) 50 MCG/ACT nasal spray Place 1 spray into both nostrils daily.   Yes [provider]  guaifenesin (ROBITUSSIN) 100 MG/5ML syrup Take 200 mg by mouth every 6 (six) hours as needed for cough.   Yes [provider]  HYDROcodone-acetaminophen (NORCO/VICODIN) 5-325 MG tablet Take 1 tablet by mouth every 6 (six) hours as needed for pain. 11/09/18 11/09/19 Yes [provider]  hydrocortisone (ANUSOL-HC) 2.5 % rectal cream Place 1 application rectally 2 (two) times daily as needed for hemorrhoids or anal itching.   Yes [provider]  hydrocortisone (CORTEF) 10 MG tablet Take 1 tablet in the morning and half a tablet at night. Patient taking differently: Take 5-10 mg by mouth See admin instructions. 10mg  in the morning & 5mg  at night 06/12/15  Yes Charlynne Cousins, MD  isosorbide mononitrate (IMDUR) 60 MG 24 hr tablet Take 60 mg by mouth 2 (two) times daily.    Yes [provider]  levothyroxine (SYNTHROID) 75 MCG tablet Take 75 mcg by mouth daily. 09/30/18  Yes [provider]  Magnesium Cl-Calcium Carbonate (SLOW-MAG PO) Take 1 tablet by mouth daily.   Yes [provider]  metoprolol succinate (TOPROL-XL) 25 MG 24 hr tablet Take 25 mg by mouth daily.   Yes [provider]  Multiple Vitamin (MULTIVITAMIN WITH MINERALS) TABS tablet Take 1 tablet by mouth daily.   Yes [provider]  nitroGLYCERIN (NITROSTAT) 0.4 MG SL tablet Place 0.4 mg under the tongue as needed for chest pain. 04/05/18 04/05/19 Yes [provider]  ondansetron (ZOFRAN) 4 MG tablet Take 4 mg by mouth every 8 (eight) hours as needed for nausea or vomiting.   Yes [provider]  OXYGEN Inhale 3 L into the lungs continuous.    Yes [provider]  polyvinyl alcohol (LIQUIFILM TEARS) 1.4 % ophthalmic solution Place 1 drop into both eyes as needed for dry eyes.   Yes [provider]  pravastatin (PRAVACHOL) 20 MG tablet Take 20 mg by mouth daily.   Yes [provider]  psyllium (REGULOID) 0.52 g capsule Take 0.52 g by mouth 2 (two) times daily.   Yes [provider]  sertraline (ZOLOFT) 50 MG tablet Take 50 mg by mouth daily.   Yes [provider]  trolamine salicylate (ASPERCREME) 10 % cream Apply 1 application topically as needed for muscle pain.   Yes [provider]    Physical Exam: BP 116/72    Pulse 95    Temp (!) 101.3 F (38.5 C)    Resp (!) 21    Ht 6' (1.829 m)    Wt 93.6 kg  SpO2 95%    BMI 27.99 kg/m   General: Mild distress, increased work of breathing, lethargic, toxic appearing, Eyes: Normal ENT: Normal Neck: Supple Cardiovascular: S1, S2 present Respiratory: Coarse breath sounds present diffusely, with crackles at the bases Abdomen: Soft, nontender, nondistended, bowel sounds present Skin: Normal Musculoskeletal: Trace bilateral lower extremity edema Psychiatric: Unable to assess, lethargic Neurologic: Unable to assess, does not follow commands          Labs on Admission:  Basic Metabolic Panel: Recent Labs  Lab 12/21/18 0930  NA 139  K 3.4*  CL 104  CO2 24  GLUCOSE 108*  BUN 34*  CREATININE 2.16*  CALCIUM 8.6*   Liver Function Tests: Recent Labs  Lab 12/21/18 0930  AST 24  ALT 13  ALKPHOS 81  BILITOT 0.8  PROT 6.7  ALBUMIN 3.5   No results for input(s): LIPASE, AMYLASE in the last 168 hours. No results for input(s): AMMONIA in the last 168 hours. CBC: Recent Labs  Lab 12/21/18 0930  WBC 13.6*  NEUTROABS 11.1*  HGB 12.2*  HCT 38.5*  MCV 94.8  PLT 222   Cardiac Enzymes: No results for input(s): CKTOTAL, CKMB, CKMBINDEX, TROPONINI in the last 168 hours.  BNP (last 3 results) Recent Labs    12/21/18 0930  BNP  512.6*    ProBNP (last 3 results) No results for input(s): PROBNP in the last 8760 hours.  CBG: No results for input(s): GLUCAP in the last 168 hours.  Radiological Exams on Admission: Ct Abdomen Pelvis Wo Contrast  Result Date: 12/21/2018 CLINICAL DATA:  Nausea vomiting since yesterday, confusion. Limited evaluation due to patient condition. EXAM: CT ABDOMEN AND PELVIS WITHOUT CONTRAST TECHNIQUE: Multidetector CT imaging of the abdomen and pelvis was performed following the standard protocol without IV contrast. COMPARISON:  11/08/2016 FINDINGS: Lower chest: Superimposed airspace disease is present upon a background of interstitial lung disease. To a lesser extent similar changes are seen in the medial left lung base. No signs of pleural effusion. Extensive coronary artery calcifications and evidence of aortic atherosclerosis with stable dilation of the descending thoracic aorta to 3.6 cm. Heart size is enlarged without pericardial effusion. Central pulmonary vasculature is engorged at 3.8 cm. Hepatobiliary: Lobular hepatic contours. No signs of focal lesion. Post cholecystectomy. No signs of biliary ductal dilation. Pancreas: Unremarkable. No pancreatic ductal dilatation or surrounding inflammatory changes. Spleen: Normal in size without focal abnormality. Adrenals/Urinary Tract: Adrenal glands are normal. Post right nephrectomy. Renal cortical scarring noted on the left. No signs of left-sided hydronephrosis or perinephric fluid. Stomach/Bowel: No signs of bowel obstruction. Large volume stool is however present in the rectum but with similar appearance to prior exam. Colonic diverticulosis. Large duodenal diverticulum unchanged. Vascular/Lymphatic: Marked vascular disease with moderate to marked dilation of the infrarenal abdominal aorta with fusiform morphology measuring 4.4 x 4.3 cm compared to 3.8 x 3.8 cm in 2018. No periaortic stranding or hematoma. No signs of adenopathy in the retroperitoneum  or in the upper abdomen. No signs of pelvic lymphadenopathy. Reproductive: Foley catheter in situ, circumferential thickening of the urinary bladder, nonspecific finding. Other: No signs of free air. No ascites. Musculoskeletal: Extensive spinal degenerative changes are similar to the previous exam. Skis be no acute bone finding or destructive bone process. IMPRESSION: Pneumonia superimposed on chronic changes of interstitial lung disease. Aspiration is also considered given history of vomiting. Marked vascular disease with stable dilation of the descending thoracic aorta to 3.6 cm. Infrarenal abdominal aortic aneurysm measuring 4.4 x 4.3  cm compared to 3.8 x 3.8 cm in 2018. Recommend followup by ultrasound in 1 year. This recommendation follows ACR consensus guidelines: White Paper of the ACR Incidental Findings Committee II on Vascular Findings. J Am Coll Radiol 2013; 10:789-794. Aortic aneurysm NOS (ICD10-I71.9) Coronary artery disease and signs of pulmonary arterial hypertension. Aortic Atherosclerosis (ICD10-I70.0). Electronically Signed   By: Zetta Bills M.D.   On: 12/21/2018 15:32   Dg Chest Port 1 View  Result Date: 12/21/2018 CLINICAL DATA:  Cough and shortness of breath. Negative COVID-19 test. EXAM: PORTABLE CHEST 1 VIEW COMPARISON:  PA and lateral chest 06/10/2015, 11/08/2016 and 11/10/2016. FINDINGS: There is right upper lobe and bibasilar airspace disease. No pneumothorax. There are likely small bilateral pleural effusions. Cardiomegaly and atherosclerosis are identified. No acute or focal bony abnormality. IMPRESSION: Right upper lobe and bibasilar airspace disease most worrisome for pneumonia. Cardiomegaly. Atherosclerosis. Electronically Signed   By: Inge Rise M.D.   On: 12/21/2018 11:38   Dg Abd Portable 1 View  Result Date: 12/21/2018 CLINICAL DATA:  Cough, shortness of breath and renal failure. EXAM: PORTABLE ABDOMEN - 1 VIEW COMPARISON:  CT abdomen and pelvis 11/08/2016.  FINDINGS: The bowel gas pattern is normal. Moderately large stool ball in the rectosigmoid colon noted. No radio-opaque calculi or other significant radiographic abnormality are seen. The patient is status post cholecystectomy. Multilevel lumbar spondylosis is seen. IMPRESSION: No acute finding. Moderately large stool ball in the rectosigmoid colon. Electronically Signed   By: Inge Rise M.D.   On: 12/21/2018 11:39    EKG: Independently reviewed.  Sinus tachycardia, no acute ST changes  Assessment/Plan Present on Admission:  Sepsis ([place])  Chronic pulmonary embolism (HCC)  Aspiration pneumonia (HCC)  Acquired hypothyroidism  Postinflammatory pulmonary fibrosis (HCC)  Acute renal failure superimposed on stage 4 chronic kidney disease (HCC)  Principal Problem:   Sepsis ([place]) Active Problems:   Chronic pulmonary embolism (HCC)   Aspiration pneumonia (HCC)   Acquired hypothyroidism   History of adrenal insufficiency   Postinflammatory pulmonary fibrosis (HCC)   H/O unilateral nephrectomy   Acute renal failure superimposed on stage 4 chronic kidney disease (HCC)   Sepsis 2/2 aspiration pneumonia Vs CAP Vs COVID-19 virus Febrile, leukocytosis, tachycardic, tachypneic, hypoxic on presentation CRP 0.8, ferritin 35 Lactic acid elevated--> will trend  Procalcitonin <0.10, WBC 13.6 COVID-19 negative, repeat pending BC x2 pending UA negative Urine strep pneumo, Legionella pending Chest x-ray showed right upper lobe and bibasilar airspace disease most worrisome for pneumonia Broad-spectrum antibiotics with cefepime and vancomycin Will involve PCCM if patient deteriorates any further Consult SLP once clinical improvement Monitor in stepdown unit  Acute on chronic hypoxic respiratory failure, underlying history of pulmonary fibrosis Likely 2/2 above as mentioned On home O2 about 3 L, now on high flow We will hold off on placing patient on nebulizer treatments pending repeat  Covid test Management as above Supplemental oxygen/nonrebreather if needed  Elevated troponin/Hx of CAD Denies any chest pain Troponin 725-->1965 BNP 512.6 EKG with no acute ST changes ECHO with EF 60-65%, no regional [facility] Cardiology consulted, likely demand ischemia, no further work up recommended  Hypertension Currently hypotensive likely 2/2 sepsis Limit IVF in the setting of multifocal PNA with hypoxia  Continue stress dose steroids If persistent, may consult PCCM for pressors  Elevated d-dimer 6.36 Could be elevated in the setting of sepsis Hx of PE, was on coumadin, but stopped in 2018 2/2 severe GIB VQ scan ordered, pending  Hx of adrenal insufficiency Could explain  his persistent N/V Abd xray showed moderately large stool ball in the rectosigmoid colon CT abd/pelvis with no acute abdominal abnormality Start stress dose steroids, plan to taper off as pt improves Hold home florinef, cortef  Hx of Pulmonary fibrosis On O2 [place] 3L Follows with pulmonology  CKD stage IV/Hx of right nephrectomy Creatinine at baseline Avoid nephrotoxics Daily BMP  Stool Impaction Enema prn     DVT prophylaxis: Heparin [place]  Code Status: DNR   Family Communication: Had extensive conversations with the son on multiple occasions, confirmed patient is DNR (after he discussed with his sister). Son also stated he would not want to be placed on dialysis.   Disposition Plan: TBD  Consults called: Cardiology   Admission status: Inpatient     Alma Friendly MD [facility]   If 7PM-7AM, please contact night-coverage [email or web address]   12/21/2018, 5:39 PM

## 2018-12-21 NOTE — ED Notes (Signed)
Date and time results received: 12/21/18 10:37 AM  (use smartphrase ".now" to insert current time)  Test: troponin 725 Critical Value: lactic acid 2.6 Name of Provider Notified:  Dr Regenia Skeeter and primary RN   Orders Received? Or Actions Taken?:

## 2018-12-21 NOTE — Consult Note (Signed)
Cardiology Consultation:   Patient ID: Leonard Shepard MRN: [id number]; DOB: [birth year]  Admit date: 12/21/2018 Date of Consult: 12/21/2018  Primary Care Provider: Hennie Duos, MD Primary Cardiologist: Dr. Mauricio Po at [place] Primary Electrophysiologist:  None    Patient Profile:   Leonard Shepard is a 85 y.o. male with a history of CAD s/p remote stenting, pulmonary fibrosis on home O2, chronic PE no longer on Coumadin due to severe GI bleed in 2018, hypertension, hyperlipidemia, solitary kidney s/p right nephrectomy with CKD stage IV who is being seen today for the evaluation of elevated troponin at the request of Dr. Horris Latino.  History of Present Illness:   Leonard Shepard is a 85 year old male with the above history who is followed by Dr. Mauricio Po at [facility]. Per chart review in Care Everywhere, last ischemic evaluation was a nuclear stress test in 2017 which showed small perfusion defect from previous anterolateral infarct but overall was considered low risk. Most recent Echo from 05/2016 showed LVEF of 50-55% with mild LVH, grade 1 diastolic dysfunction, mild to moderate mitral regurgitation, and mild aortic root dilatation. Patient last seen by Dr. Mauricio Po in 04/2018 at which time he reported some intrascapular chest discomfort the day prior to visit for which he took an old sublingual Nitro tablet. EKG showed no acute changes. Dr. Mauricio Po was unsure if it was his angina but patient reluctant to pursue ischemic evaluation given CKD.Therefore, Amlodipine was increased to 5mg  daily and patient was instructed to follow-up in 6 months.   Patient presented to the [facility] ED today for evaluation of nausea/vomiting and shortness of breath. Patient currently very lethargic and unable to be aroused much. Therefore, history obtained from son Leonard Shepard. Leonard Shepard states patient has been doing well from a cardiac standpoint. He underwent stenting for CAD at least 10 years ago while living in  [place]. Since then, he has done well from a cardiac standpoint. He moved from here from [place] about 3 years ago and has followed with Dr. Mauricio Po since then. His biggest problems has been his breathing issues. He was diagnosed with pulmonary fibrosis shortly after moving here and follows with Pulmonology. He is on 3 L of O2 at home. He was admitted for similar picture (sepsis secondary to pneumonia) in 11/2016 but before then was staying very active (mowing grass, hunting, riding four wheeler).  Son states patient was in his usual state of health until this morning when he woke up around 6-7am complaining of chills and significant shortness of breath. He then starting vomiting and O2 sats were noted to be in the 60's. Daughter-in-law called 31.   Upon arrival to the ED, patient febrile with temp as high as 102.4 and mildly tachycardic, mildly tachypneic, and hypertensive with BP of 190/109. O2 sats were in the low 80s. EKG showed sinus tachycardia, rate 102, with LVH with T wave inversion in lead aVL but no acute ST/T changes. High-sensitivity troponin 725 >> 1,965. D-dimer elevated at  6.36. BNP elevated at 512.6. Chest x-ray showed right upper lobe and bibasilar airspace disease most worrisome for pneumonia. Abdominal x-ray showed moderately large stool ball in the rectosigmoid colon but no acute findings. WBC 13.6, Hgb 12.2, Plts 222. Na 139, K 3.4, Glucose 108, BUN 34, Cr 2.16. Lactic acid 2.6. COVID-19 negative. Patient now hypotensive with systolic BP in the 99991111 and very lethargic. Patient given multiple fluid boluses and started on IV antibiotics. Cardiology was consulted for elevated troponin.  Heart Pathway Score:  Past Medical History:  Diagnosis Date   Acute renal failure superimposed on stage 4 chronic kidney disease ([place]) 11/16/2016   Anemia due to acute blood loss    Aspiration pneumonia (HCC)    BPH (benign prostatic hyperplasia)    CAD (coronary artery disease)    s/p stent     Chronic renal insufficiency, stage III (moderate)    CKD (chronic kidney disease)    COPD (chronic obstructive pulmonary disease) (HCC)    Depression    Diverticulitis of colon with bleeding    GERD (gastroesophageal reflux disease)    H/O unilateral nephrectomy    Heart attack ([place])    HLD (hyperlipidemia)    Lower GI bleed 11/20/2016   Oropharyngeal dysphagia 11/10/2016   PE (pulmonary embolism)    Postinflammatory pulmonary fibrosis (HCC)    Rotator cuff tear, right     Past Surgical History:  Procedure Laterality Date   APPENDECTOMY     CHOLECYSTECTOMY     CIRCUMCISION     COLONOSCOPY     heart stint     INGUINAL HERNIA REPAIR Right    KNEE SURGERY Right    Pt unsure of what the Dr did, his knee had collasped on him   lumbar back surgery     x 2   NEPHRECTOMY Right    3 surgeries on this kidney before finally removing it   SHOULDER SURGERY Left    for spurs   TONSILLECTOMY AND ADENOIDECTOMY       Home Medications:  Prior to Admission medications   Medication Sig Start Date End Date Taking? Authorizing Provider  amLODipine (NORVASC) 5 MG tablet Take 5 mg by mouth daily. 05/13/18  Yes [provider]  aspirin 81 MG chewable tablet Chew 81 mg by mouth daily.   Yes [provider]  diphenhydramine-acetaminophen (TYLENOL PM) 25-500 MG TABS tablet Take 2 tablets by mouth at bedtime as needed (sleep).   Yes [provider]  docusate sodium (COLACE) 100 MG capsule Take 100 mg by mouth 2 (two) times daily as needed for mild constipation.   Yes [provider]  fludrocortisone (FLORINEF) 0.1 MG tablet Take 0.1 mg by mouth daily.   Yes [provider]  fluticasone (FLONASE) 50 MCG/ACT nasal spray Place 1 spray into both nostrils daily.   Yes [provider]  guaifenesin (ROBITUSSIN) 100 MG/5ML syrup Take 200 mg by mouth every 6 (six) hours as needed for cough.   Yes [provider]   HYDROcodone-acetaminophen (NORCO/VICODIN) 5-325 MG tablet Take 1 tablet by mouth every 6 (six) hours as needed for pain. 11/09/18 11/09/19 Yes [provider]  hydrocortisone (ANUSOL-HC) 2.5 % rectal cream Place 1 application rectally 2 (two) times daily as needed for hemorrhoids or anal itching.   Yes [provider]  hydrocortisone (CORTEF) 10 MG tablet Take 1 tablet in the morning and half a tablet at night. Patient taking differently: Take 5-10 mg by mouth See admin instructions. 10mg  in the morning & 5mg  at night 06/12/15  Yes Charlynne Cousins, MD  isosorbide mononitrate (IMDUR) 60 MG 24 hr tablet Take 60 mg by mouth 2 (two) times daily.    Yes [provider]  levothyroxine (SYNTHROID) 75 MCG tablet Take 75 mcg by mouth daily. 09/30/18  Yes [provider]  Magnesium Cl-Calcium Carbonate (SLOW-MAG PO) Take 1 tablet by mouth daily.   Yes [provider]  metoprolol succinate (TOPROL-XL) 25 MG 24 hr tablet Take 25 mg by mouth  daily.   Yes [provider]  Multiple Vitamin (MULTIVITAMIN WITH MINERALS) TABS tablet Take 1 tablet by mouth daily.   Yes [provider]  nitroGLYCERIN (NITROSTAT) 0.4 MG SL tablet Place 0.4 mg under the tongue as needed for chest pain. 04/05/18 04/05/19 Yes [provider]  ondansetron (ZOFRAN) 4 MG tablet Take 4 mg by mouth every 8 (eight) hours as needed for nausea or vomiting.   Yes [provider]  OXYGEN Inhale 3 L into the lungs continuous.    Yes [provider]  polyvinyl alcohol (LIQUIFILM TEARS) 1.4 % ophthalmic solution Place 1 drop into both eyes as needed for dry eyes.   Yes [provider]  pravastatin (PRAVACHOL) 20 MG tablet Take 20 mg by mouth daily.   Yes [provider]  psyllium (REGULOID) 0.52 g capsule Take 0.52 g by mouth 2 (two) times daily.   Yes [provider]  sertraline (ZOLOFT) 50 MG tablet Take 50 mg by mouth daily.   Yes  [provider]  trolamine salicylate (ASPERCREME) 10 % cream Apply 1 application topically as needed for muscle pain.   Yes [provider]    Inpatient Medications: Scheduled Meds:  levothyroxine  35 mcg Intravenous Daily   Continuous Infusions:  ceFEPime (MAXIPIME) IV     sodium chloride     vancomycin     PRN Meds: promethazine  Allergies:    Allergies  Allergen Reactions   Aclidinium Bromide Itching and Other (See Comments)    Throat irritation    Social History:   Social History   Socioeconomic History   Marital status: Widowed    Spouse name: Not on file   Number of children: 2   Years of education: Not on file   Highest education level: Not on file  Occupational History   Not on file  Social Needs   Financial resource strain: Not on file   Food insecurity    Worry: Not on file    Inability: Not on file   Transportation needs    Medical: Not on file    Non-medical: Not on file  Tobacco Use   Smoking status: Former Smoker    Types: Cigarettes, Pipe   Smokeless tobacco: Never Used  Substance and Sexual Activity   Alcohol use: Yes    Alcohol/week: 0.0 standard drinks    Comment: occ.   Drug use: No   Sexual activity: Not Currently  Lifestyle   Physical activity    Days per week: Not on file    Minutes per session: Not on file   Stress: Not on file  Relationships   Social connections    Talks on phone: Not on file    Gets together: Not on file    Attends religious service: Not on file    Active member of club or organization: Not on file    Attends meetings of clubs or organizations: Not on file    Relationship status: Not on file   Intimate partner violence    Fear of current or ex partner: Not on file    Emotionally abused: Not on file    Physically abused: Not on file    Forced sexual activity: Not on file  Other Topics Concern   Not on file  Social History Narrative   Admitted to [facility]12[facility]   Widowed   Never smoked   Alcohol occ.   Full Code    Family History:  Family History  Problem Relation Age of Onset   Lung disease Father    Leukemia Brother      ROS:  Please see the history of present illness.  Review of Systems  Unable to perform ROS: Mental status change    Physical Exam/Data:   Vitals:   12/21/18 1139 12/21/18 1230 12/21/18 1235 12/21/18 1236  BP:  (!) 171/97    Pulse:  (!) 129  (!) 103  Resp:  18  (!) 37  Temp: (!) 101.1 F (38.4 C) (!) 102.2 F (39 C)  (!) 102.4 F (39.1 C)  TempSrc: Bladder     SpO2:  (!) 79%  (!) 84%  Weight:   93.4 kg   Height:   6' (1.829 m)     Intake/Output Summary (Last 24 hours) at 12/21/2018 1307 Last data filed at 12/21/2018 1129 Gross per 24 hour  Intake 10 ml  Output 900 ml  Net -890 ml   Last 3 Weights 12/21/2018 12/09/2016 11/28/2016  Weight (lbs) 205 lb 14.6 oz 206 lb 205 lb  Weight (kg) 93.4 kg 93.441 kg 92.987 kg     Body mass index is 27.93 kg/m.  General: 85 y.o. ill appearing male resting comfortably in no acute distress. HEENT: Normocephalic and atraumatic. Neck: Supple. Difficult to assess JVD given patient's position in bed. Heart: RRR. Heart sounds difficult to hear over lung sounds. Lungs: Tachypneic with mild increased work of breathing. Course breath/rhonchi present diffusely with crackles noted in left base.   Abdomen: Soft, non-distended, and non-tender to palpation. Bowel sounds present. Extremities: Trace lower extremity edema bilaterally. Skin: Warm and dry. Neuro: Very lethargic. Groans with firm sternal rub but does not open eyes. Psych: Lethargic.    EKG:  The EKG was personally reviewed and demonstrates:  Sinus tachycardia, rate 102, with PVC, LVH with T wave inversion in aVL, left axis deviation, and QTc of 484 ms. No acute ST/T changes compared to prior tracings.  Telemetry:  Telemetry was personally reviewed and demonstrates: Sinus rhythm with baseline  heart rates in th high 80's to low 100's but as high as the 140's at times.  Relevant CV Studies: Nuclear Stress Test 09/07/2015: Impression: Abnormal cardiac perfusion exam for small previous anterolateral infarct. Prognostically this is a low risk scan. _______________  Echocardiogram 05/08/2016: Summary: A complete portable two-dimensional transthoracic echocardiogram with color flow Doppler and Spectral Doppler was performed. The study was technically adequate. The left ventricle is normal in size. There is mild concentric left ventricular hypertrophy with normal wall motion and ejection fraction 50-55%. Grade I mild diastolic dysfunction; abnormal relaxation pattern. The left atrium is mildly dilated. There is mild-moderate (1-2+) mitral regurgitation. Estimation of right ventricular systolic pressure is not possible. There is mild aortic root dilatation.  Laboratory Data:  High Sensitivity Troponin:   Recent Labs  Lab 12/21/18 0930 12/21/18 1115  TROPONINIHS 725* 1,965*     Chemistry Recent Labs  Lab 12/21/18 0930  NA 139  K 3.4*  CL 104  CO2 24  GLUCOSE 108*  BUN 34*  CREATININE 2.16*  CALCIUM 8.6*  GFRNONAA 25*  GFRAA 29*  ANIONGAP 11    Recent Labs  Lab 12/21/18 0930  PROT 6.7  ALBUMIN 3.5  AST 24  ALT 13  ALKPHOS 81  BILITOT 0.8   Hematology Recent Labs  Lab 12/21/18 0930  WBC 13.6*  RBC 4.06*  HGB 12.2*  HCT 38.5*  MCV 94.8  MCH 30.0  MCHC 31.7  RDW 14.4  PLT 222   BNP Recent Labs  Lab 12/21/18 0930  BNP 512.6*    DDimer  Recent Labs  Lab 12/21/18 0930  DDIMER 6.36*     Radiology/Studies:  Dg Chest Port 1 View  Result Date: 12/21/2018 CLINICAL DATA:  Cough and shortness of breath. Negative COVID-19 test. EXAM: PORTABLE CHEST 1 VIEW COMPARISON:  PA and lateral chest 06/10/2015, 11/08/2016 and 11/10/2016. FINDINGS: There is right upper lobe and bibasilar airspace disease. No pneumothorax. There are likely small bilateral  pleural effusions. Cardiomegaly and atherosclerosis are identified. No acute or focal bony abnormality. IMPRESSION: Right upper lobe and bibasilar airspace disease most worrisome for pneumonia. Cardiomegaly. Atherosclerosis. Electronically Signed   By: Inge Rise M.D.   On: 12/21/2018 11:38   Dg Abd Portable 1 View  Result Date: 12/21/2018 CLINICAL DATA:  Cough, shortness of breath and renal failure. EXAM: PORTABLE ABDOMEN - 1 VIEW COMPARISON:  CT abdomen and pelvis 11/08/2016. FINDINGS: The bowel gas pattern is normal. Moderately large stool ball in the rectosigmoid colon noted. No radio-opaque calculi or other significant radiographic abnormality are seen. The patient is status post cholecystectomy. Multilevel lumbar spondylosis is seen. IMPRESSION: No acute finding. Moderately large stool ball in the rectosigmoid colon. Electronically Signed   By: Inge Rise M.D.   On: 12/21/2018 11:39    Assessment and Plan:   Elevated Troponin with Known CAD - Patient admitted with sepsis secondary to pneumonia and was found to have elevated troponin. He has known CAD s/p remote stenting at least 10 years ago while living in [place]. - High-sensitivity troponin elevated at 725 >> 1,965. - EKG shows no acute ST/T changes. - Echo pending. - Patient very lethargic and unable to answer questions at this time does not sound like he has had any cardiac issues lately. - Most likely demand ischemia secondary to sepsis/pneumonia. No additional ischemic evaluation indicated at this time. Family currently discussing goals of care. If patient recovers, can follow-up with primary Cardiologist and discuss whether additional evaluation is necessary.  Elevated BNP - BNP elevated in the 500's. - Rhonchi/crackles on lung exam but patient has pneumonia. Only minimal lower extremity edema. - Given hypotension and CKD stage IV, would likely hold off on any diuretics right now.  Elevated D-Dimer - D-dimer  elevated at 6.36. - Patient has history of PE for which he was previously on Coumadin but came off of this in 2018 due to severe GI bleed. - Chest CTA not an option right now due to renal function. Will defer V/Q scan to primary team.  Sepsis Secondary to Pneumonia - COVID-19 negative.  - Lactic acid elevated at 2.6.  - Procalcitonin normal. - Culture pending.  - Currently on antibiotics.   Hypotensive - BP was initially markedly elevated but patient is now hypotensive with systolic BP in the 99991111. - On fluids. - Management per primary team.   Pulmonary Fibrosis on Home O2 - Follows with Pulmonology  Solitary Kidney s/p Right Nephrectomy with CKD Stage IV - Creatinine 2.16 on admission. Son report most recent creatinine was around 1.7. - Patient has stated previously that he is not interested in every being on dialysis. - Continue to monitor.  Otherwise, per primary team.   For questions or updates, please contact [place] Please consult [email or web address] for contact info under     Signed, Darreld Mclean, PA-C  12/21/2018 1:07 PM

## 2018-12-21 NOTE — Progress Notes (Signed)
Pharmacy Antibiotic Note  Leonard Shepard is a 85 y.o. male admitted on 12/21/2018 with sepsis.  Pharmacy has been consulted for vancomycin and cefepime dosing. Rocephin and azithromycin have been given in the ED.  Temp 102.2, WBC 13.6, SCr 2.16  CXR: RUL worrisome for PNA  Plan: Cefepime 2 gm IV q24 for CrCl < 30 ml/min Vancomycin 2 gm loading dose Vancomycin 1500 mg IV Q 48 hrs. Goal AUC 400-550. Expected AUC: 475.6 SCr used: 2.16 Wt 93.4 kg F/u renal fxn, WBC, temp, culture data Vancomycin levels as needed   Temp (24hrs), Avg:99.8 F (37.7 C), Min:98.4 F (36.9 C), Max:101.1 F (38.4 C)  Recent Labs  Lab 12/21/18 0930 12/21/18 1114  WBC 13.6*  --   CREATININE 2.16*  --   LATICACIDVEN 2.6* 3.9*    CrCl cannot be calculated (Unknown ideal weight.).    Allergies  Allergen Reactions  . Aclidinium Bromide Itching and Other (See Comments)    Throat irritation    Antimicrobials this admission: 10/20 CTX x 1 10/20 azith x 1 10/20 vanc>> 10/20 cefepime>>  Dose adjustments this admission:  Microbiology results: 10/20 BCx2: sent 10/20 Covid neg 10/20 Ucx: sent 10/20 MRSA PCR: ordered  Thank you for allowing pharmacy to be a part of this patient's care.  Eudelia Bunch, Pharm.D [phone] 12/21/2018 12:37 PM

## 2018-12-21 NOTE — ED Triage Notes (Addendum)
Patient arrived by EMS from home. Patient c/o N/V and SOB since last night.   Patient has hx of pulmonary fibrosis.   Patient's son lives with patient. Patient's son tested positive for COVID-19. Patient has attempted to social distance from son while living in the same house per patient statement.  EMS gave 8mg  of Zofran. Patient's continues to have projectile vomiting.

## 2018-12-21 NOTE — ED Notes (Signed)
RN notified of abnormal lab 

## 2018-12-21 NOTE — Progress Notes (Signed)
  Echocardiogram 2D Echocardiogram has been performed.  Darlina Sicilian M 12/21/2018, 2:13 PM

## 2018-12-21 NOTE — ED Provider Notes (Signed)
[facility] DEPT Provider Note   CSN: [id number] Arrival date & time: 12/21/18  0840    LEVEL 5 CAVEAT - ALTERED MENTAL STATUS   History   Chief Complaint Chief Complaint  Patient presents with   Emesis   Shortness of Breath    HPI Leonard Shepard is a 85 y.o. male.     HPI  85 year old male brought in by EMS with vomiting and shortness of breath.  History of pulmonary fibrosis, currently on 2 or 3 L at home.  Patient lives with his son and his son recently tested positive for Covid-19.  Started having vomiting last night and shortness of breath and EMS was called.  EMS is given 8 mg Zofran.  Patient is requiring more oxygen than his typical baseline.  Patient states he feels ill and knows he is vomiting but otherwise a history is limited as he has a hard time placing his words for how he is feeling.  Past Medical History:  Diagnosis Date   Acute renal failure superimposed on stage 4 chronic kidney disease ([place]) 11/16/2016   Anemia due to acute blood loss    Aspiration pneumonia (HCC)    BPH (benign prostatic hyperplasia)    CAD (coronary artery disease)    s/p stent   Chronic renal insufficiency, stage III (moderate)    CKD (chronic kidney disease)    COPD (chronic obstructive pulmonary disease) (HCC)    Depression    Diverticulitis of colon with bleeding    GERD (gastroesophageal reflux disease)    H/O unilateral nephrectomy    Heart attack ([place])    HLD (hyperlipidemia)    Lower GI bleed 11/20/2016   Oropharyngeal dysphagia 11/10/2016   PE (pulmonary embolism)    Postinflammatory pulmonary fibrosis (HCC)    Rotator cuff tear, right     Patient Active Problem List   Diagnosis Date Noted   Supratherapeutic INR 12/09/2016   Hypoxia 12/09/2016   HX: anticoagulation    Anemia due to acute blood loss    Diverticulitis of colon with bleeding    Coagulopathy ([place])    Lower GI bleed 11/20/2016   Dysphasia  11/16/2016   Acute renal failure superimposed on stage 4 chronic kidney disease ([place]) 11/16/2016   Adrenal insufficiency due to steroid withdrawal ([place]) 11/16/2016   Hypertension 11/16/2016   Abdominal aortic atherosclerosis ([place]) 11/10/2016   Oropharyngeal dysphagia 11/10/2016   Hemoptysis    Chronic anticoagulation    S/P coronary artery stent placement    Acquired hypothyroidism    History of adrenal insufficiency    Postinflammatory pulmonary fibrosis ([place])    H/O unilateral nephrectomy    Hx pulmonary embolism    Aspiration pneumonia ([place]) 11/08/2016   Orthostasis 06/11/2015   Sepsis ([place]) 06/11/2015   Nausea & vomiting 06/11/2015   Abdominal pain 06/11/2015   HLD (hyperlipidemia)    GERD (gastroesophageal reflux disease)    CAD (coronary artery disease)    Chronic pulmonary embolism (HCC)    BPH (benign prostatic hyperplasia)    Chronic renal insufficiency, stage III (moderate)    COPD (chronic obstructive pulmonary disease) ([place])    Depression     Past Surgical History:  Procedure Laterality Date   APPENDECTOMY     CHOLECYSTECTOMY     CIRCUMCISION     COLONOSCOPY     heart stint     INGUINAL HERNIA REPAIR Right    KNEE SURGERY Right    Pt unsure of what the Dr did, his  knee had collasped on him   lumbar back surgery     x 2   NEPHRECTOMY Right    3 surgeries on this kidney before finally removing it   SHOULDER SURGERY Left    for spurs   TONSILLECTOMY AND ADENOIDECTOMY          Home Medications    Prior to Admission medications   Medication Sig Start Date End Date Taking? Authorizing Provider  amLODipine (NORVASC) 5 MG tablet Take 5 mg by mouth daily. 05/13/18  Yes [provider]  aspirin 81 MG chewable tablet Chew 81 mg by mouth daily.   Yes [provider]  diphenhydramine-acetaminophen (TYLENOL PM) 25-500 MG TABS tablet Take 2 tablets by mouth at bedtime as needed (sleep).   Yes [provider]  docusate sodium (COLACE) 100 MG capsule Take 100 mg by mouth 2 (two) times daily as needed for mild constipation.   Yes [provider]  fludrocortisone (FLORINEF) 0.1 MG tablet Take 0.1 mg by mouth daily.   Yes [provider]  fluticasone (FLONASE) 50 MCG/ACT nasal spray Place 1 spray into both nostrils daily.   Yes [provider]  guaifenesin (ROBITUSSIN) 100 MG/5ML syrup Take 200 mg by mouth every 6 (six) hours as needed for cough.   Yes [provider]  HYDROcodone-acetaminophen (NORCO/VICODIN) 5-325 MG tablet Take 1 tablet by mouth every 6 (six) hours as needed for pain. 11/09/18 11/09/19 Yes [provider]  hydrocortisone (ANUSOL-HC) 2.5 % rectal cream Place 1 application rectally 2 (two) times daily as needed for hemorrhoids or anal itching.   Yes [provider]  hydrocortisone (CORTEF) 10 MG tablet Take 1 tablet in the morning and half a tablet at night. Patient taking differently: Take 5-10 mg by mouth 2 (two) times daily. 10mg  in the mornign & 5mg  at night 06/12/15  Yes Charlynne Cousins, MD  isosorbide mononitrate (IMDUR) 60 MG 24 hr tablet Take 60 mg by mouth 2 (two) times daily.    Yes [provider]  levothyroxine (SYNTHROID) 75 MCG tablet Take 75 mcg by mouth daily. 09/30/18  Yes [provider]  Magnesium Cl-Calcium Carbonate (SLOW-MAG PO) Take 1 tablet by mouth daily.   Yes [provider]  metoprolol succinate (TOPROL-XL) 25 MG 24 hr tablet Take 25 mg by mouth daily.   Yes [provider]  Multiple Vitamin (MULTIVITAMIN WITH MINERALS) TABS tablet Take 1 tablet by mouth daily.   Yes [provider]  nitroGLYCERIN (NITROSTAT) 0.4 MG SL tablet Place 0.4 mg under the tongue as needed for chest pain. 04/05/18 04/05/19 Yes [provider]  ondansetron (ZOFRAN) 4 MG tablet Take 4 mg by mouth every 8 (eight) hours as needed for nausea or vomiting.   Yes [provider]  OXYGEN Inhale 3 L into the lungs continuous.    Yes [provider]  polyvinyl alcohol (LIQUIFILM TEARS) 1.4 % ophthalmic solution Place 1 drop into both eyes as needed for dry eyes.   Yes [provider]  pravastatin (PRAVACHOL) 20 MG tablet Take 20 mg by mouth daily.   Yes [provider]  psyllium (REGULOID) 0.52 g capsule Take 0.52 g by mouth 2 (two) times daily.   Yes [provider]  sertraline (ZOLOFT) 50 MG tablet Take 50 mg by mouth daily.   Yes [provider]  trolamine salicylate (ASPERCREME) 10 % cream Apply 1 application topically as needed for muscle pain.   Yes [provider]  Family History Family History  Problem Relation Age of Onset   Lung disease Father    Leukemia Brother     Social History Social History   Tobacco Use   Smoking status: Former Smoker    Types: Cigarettes, Pipe   Smokeless tobacco: Never Used  Substance Use Topics   Alcohol use: Yes    Alcohol/week: 0.0 standard drinks    Comment: occ.   Drug use: No     Allergies   Aclidinium bromide   Review of Systems Review of Systems  Unable to perform ROS: Mental status change     Physical Exam Updated Vital Signs BP (!) 171/97    Pulse (!) 129    Temp (!) 102.2 F (39 C)    Resp 18    SpO2 (!) 79%   Physical Exam Vitals signs and nursing note reviewed.  Constitutional:      Appearance: He is well-developed. He is ill-appearing.  HENT:     Head: Normocephalic and atraumatic.     Right Ear: External ear normal.     Left Ear: External ear normal.     Nose: Nose normal.  Eyes:     General:        Right eye: No discharge.        Left eye: No discharge.  Neck:     Musculoskeletal: Neck supple.  Cardiovascular:     Rate and Rhythm: Regular rhythm. Tachycardia present.     Heart sounds: Normal heart sounds.     Comments: HR~100 Pulmonary:     Effort: Pulmonary effort is normal. Tachypnea present. No  accessory muscle usage or respiratory distress.     Breath sounds: Rhonchi present.  Abdominal:     Palpations: Abdomen is soft.     Tenderness: There is abdominal tenderness in the suprapubic area.  Musculoskeletal:     Right lower leg: Edema present.     Left lower leg: Edema present.     Comments: Mild pitting edema in BLE  Skin:    General: Skin is warm and dry.  Neurological:     Mental Status: He is alert.  Psychiatric:        Mood and Affect: Mood is not anxious.      ED Treatments / Results  Labs (all labs ordered are listed, but only abnormal results are displayed) Labs Reviewed  LACTIC ACID, PLASMA - Abnormal; Notable for the following components:      Result Value   Lactic Acid, Venous 2.6 (*)    All other components within normal limits  LACTIC ACID, PLASMA - Abnormal; Notable for the following components:   Lactic Acid, Venous 3.9 (*)    All other components within normal limits  CBC WITH DIFFERENTIAL/PLATELET - Abnormal; Notable for the following components:   WBC 13.6 (*)    RBC 4.06 (*)    Hemoglobin 12.2 (*)    HCT 38.5 (*)    Neutro Abs 11.1 (*)    All other components within normal limits  COMPREHENSIVE METABOLIC PANEL - Abnormal; Notable for the following components:   Potassium 3.4 (*)    Glucose, Bld 108 (*)    BUN 34 (*)    Creatinine, Ser 2.16 (*)    Calcium 8.6 (*)    GFR calc non Af Amer 25 (*)    GFR calc Af Amer 29 (*)    All other components within normal limits  D-DIMER, QUANTITATIVE (NOT AT [facility]) - Abnormal; Notable for the following  components:   D-Dimer, Quant 6.36 (*)    All other components within normal limits  BRAIN NATRIURETIC PEPTIDE - Abnormal; Notable for the following components:   B Natriuretic Peptide 512.6 (*)    All other components within normal limits  URINALYSIS, ROUTINE W REFLEX MICROSCOPIC - Abnormal; Notable for the following components:   Protein, ur 100 (*)    Bacteria, UA RARE (*)    All other components  within normal limits  TROPONIN I (HIGH SENSITIVITY) - Abnormal; Notable for the following components:   Troponin I (High Sensitivity) 725 (*)    All other components within normal limits  SARS CORONAVIRUS 2 BY RT PCR (HOSPITAL ORDER, [facility])  CULTURE, BLOOD (ROUTINE X 2)  CULTURE, BLOOD (ROUTINE X 2)  URINE CULTURE  PROCALCITONIN  LACTATE DEHYDROGENASE  FERRITIN  TRIGLYCERIDES  FIBRINOGEN  C-REACTIVE PROTEIN  TROPONIN I (HIGH SENSITIVITY)    EKG EKG Interpretation  Date/Time:  Tuesday December 21 2018 08:57:26 EDT Ventricular Rate:  102 PR Interval:    QRS Duration: 105 QT Interval:  371 QTC Calculation: 484 R Axis:   -42 Text Interpretation:  Sinus tachycardia Ventricular premature complex Left axis deviation Borderline repolarization abnormality Borderline prolonged QT interval Baseline wander in lead(s) V3 Confirmed by Sherwood Gambler [phone]) on 12/21/2018 9:45:01 AM   Radiology Dg Chest Port 1 View  Result Date: 12/21/2018 CLINICAL DATA:  Cough and shortness of breath. Negative COVID-19 test. EXAM: PORTABLE CHEST 1 VIEW COMPARISON:  PA and lateral chest 06/10/2015, 11/08/2016 and 11/10/2016. FINDINGS: There is right upper lobe and bibasilar airspace disease. No pneumothorax. There are likely small bilateral pleural effusions. Cardiomegaly and atherosclerosis are identified. No acute or focal bony abnormality. IMPRESSION: Right upper lobe and bibasilar airspace disease most worrisome for pneumonia. Cardiomegaly. Atherosclerosis. Electronically Signed   By: Inge Rise M.D.   On: 12/21/2018 11:38   Dg Abd Portable 1 View  Result Date: 12/21/2018 CLINICAL DATA:  Cough, shortness of breath and renal failure. EXAM: PORTABLE ABDOMEN - 1 VIEW COMPARISON:  CT abdomen and pelvis 11/08/2016. FINDINGS: The bowel gas pattern is normal. Moderately large stool ball in the rectosigmoid colon noted. No radio-opaque calculi or other significant  radiographic abnormality are seen. The patient is status post cholecystectomy. Multilevel lumbar spondylosis is seen. IMPRESSION: No acute finding. Moderately large stool ball in the rectosigmoid colon. Electronically Signed   By: Inge Rise M.D.   On: 12/21/2018 11:39    Procedures .Critical Care Performed by: Sherwood Gambler, MD Authorized by: Sherwood Gambler, MD   Critical care provider statement:    Critical care time (minutes):  45   Critical care time was exclusive of:  Separately billable procedures and treating other patients   Critical care was necessary to treat or prevent imminent or life-threatening deterioration of the following conditions:  Sepsis and respiratory failure   Critical care was time spent personally by me on the following activities:  Discussions with consultants, evaluation of patient's response to treatment, examination of patient, ordering and performing treatments and interventions, ordering and review of laboratory studies, ordering and review of radiographic studies, pulse oximetry, re-evaluation of patient's condition, obtaining history from patient or surrogate and review of old charts   (including critical care time)  Medications Ordered in ED Medications  cefTRIAXone (ROCEPHIN) 2 g in sodium chloride 0.9 % 100 mL IVPB (0 g Intravenous Stopped 12/21/18 1141)  azithromycin (ZITHROMAX) 500 mg in sodium chloride 0.9 % 250 mL IVPB (0 mg  Intravenous Stopped 12/21/18 1141)  sodium chloride 0.9 % bolus 1,000 mL (has no administration in time range)  promethazine (PHENERGAN) injection 12.5 mg (12.5 mg Intravenous Given 12/21/18 0921)  albuterol (VENTOLIN HFA) 108 (90 Base) MCG/ACT inhaler 2 puff (2 puffs Inhalation Given 12/21/18 0923)  sodium chloride 0.9 % bolus 500 mL (0 mLs Intravenous Stopped 12/21/18 1235)  acetaminophen (TYLENOL) tablet 500 mg (500 mg Oral Given 12/21/18 1200)     Initial Impression / Assessment and Plan / ED Course  I have  reviewed the triage vital signs and the nursing notes.  Pertinent labs & imaging results that were available during my care of the patient were reviewed by me and considered in my medical decision making (see chart for details).        Patient presents with respiratory illness.  Further discussion with son over the phone indicates that this is very similar to when he had pneumonia in the past.  He was given Rocephin and azithromycin.  Testing for the novel coronavirus is negative.  His son actually got over this illness over 10 days ago and was cleared to go back to work last week.  Otherwise, labs are consistent with severe sepsis.  His troponin is elevated, though I think this is likely endorgan damage from sepsis rather than acute MI.  At this point with high flow nasal cannula his work of breathing is better and his oxygenation is better.  He will be admitted to the stepdown unit.  Leonard Shepard was evaluated in Emergency Department on 12/21/2018 for the symptoms described in the history of present illness. He was evaluated in the context of the global COVID-19 pandemic, which necessitated consideration that the patient might be at risk for infection with the SARS-CoV-2 virus that causes COVID-19. Institutional protocols and algorithms that pertain to the evaluation of patients at risk for COVID-19 are in a state of rapid change based on information released by regulatory bodies including the CDC and federal and state organizations. These policies and algorithms were followed during the patient's care in the ED.   Final Clinical Impressions(s) / ED Diagnoses   Final diagnoses:  Severe sepsis ([place])  Community acquired pneumonia of right upper lobe of lung  Acute urinary retention    ED Discharge Orders    None       Sherwood Gambler, MD 12/21/18 [phone]

## 2018-12-22 DIAGNOSIS — A419 Sepsis, unspecified organism: Principal | ICD-10-CM

## 2018-12-22 LAB — COMPREHENSIVE METABOLIC PANEL
ALT: 17 U/L (ref 0–44)
AST: 54 U/L — ABNORMAL HIGH (ref 15–41)
Albumin: 3.1 g/dL — ABNORMAL LOW (ref 3.5–5.0)
Alkaline Phosphatase: 59 U/L (ref 38–126)
Anion gap: 12 (ref 5–15)
BUN: 37 mg/dL — ABNORMAL HIGH (ref 8–23)
CO2: 21 mmol/L — ABNORMAL LOW (ref 22–32)
Calcium: 8.3 mg/dL — ABNORMAL LOW (ref 8.9–10.3)
Chloride: 109 mmol/L (ref 98–111)
Creatinine, Ser: 2.45 mg/dL — ABNORMAL HIGH (ref 0.61–1.24)
GFR calc Af Amer: 25 mL/min — ABNORMAL LOW (ref >60)
GFR calc non Af Amer: 22 mL/min — ABNORMAL LOW (ref >60)
Glucose, Bld: 106 mg/dL — ABNORMAL HIGH (ref 70–99)
Potassium: 4.7 mmol/L (ref 3.5–5.1)
Sodium: 142 mmol/L (ref 135–145)
Total Bilirubin: 0.8 mg/dL (ref 0.3–1.2)
Total Protein: 6.4 g/dL — ABNORMAL LOW (ref 6.5–8.1)

## 2018-12-22 LAB — CBC
HCT: 39.4 % (ref 39.0–52.0)
Hemoglobin: 12.3 g/dL — ABNORMAL LOW (ref 13.0–17.0)
MCH: 30 pg (ref 26.0–34.0)
MCHC: 31.2 g/dL (ref 30.0–36.0)
MCV: 96.1 fL (ref 80.0–100.0)
Platelets: 192 10*3/uL (ref 150–400)
RBC: 4.1 MIL/uL — ABNORMAL LOW (ref 4.22–5.81)
RDW: 14.6 % (ref 11.5–15.5)
WBC: 18.1 10*3/uL — ABNORMAL HIGH (ref 4.0–10.5)
nRBC: 0 % (ref 0.0–0.2)

## 2018-12-22 LAB — URINE CULTURE: Culture: NO GROWTH

## 2018-12-22 LAB — SARS CORONAVIRUS 2 (TAT 6-24 HRS): SARS Coronavirus 2: NEGATIVE

## 2018-12-22 MED ORDER — ASPIRIN 81 MG PO CHEW
81.0000 mg | CHEWABLE_TABLET | Freq: Every day | ORAL | Status: DC
  Administered 2018-12-22 – 2018-12-28 (×5): 81 mg via ORAL
  Filled 2018-12-22 (×6): qty 1

## 2018-12-22 MED ORDER — ONDANSETRON HCL 4 MG PO TABS: 4.0000 mg | ORAL_TABLET | Freq: Three times a day (TID) | ORAL | Status: DC | PRN

## 2018-12-22 MED ORDER — HYDROCODONE-ACETAMINOPHEN 5-325 MG PO TABS
1.0000 | ORAL_TABLET | Freq: Four times a day (QID) | ORAL | Status: DC | PRN
  Administered 2018-12-23 – 2018-12-24 (×2): 1 via ORAL
  Filled 2018-12-22 (×2): qty 1

## 2018-12-22 MED ORDER — BISACODYL 10 MG RE SUPP: 10.0000 mg | Freq: Once | RECTAL | Status: DC

## 2018-12-22 MED ORDER — ISOSORBIDE MONONITRATE ER 60 MG PO TB24
60.0000 mg | ORAL_TABLET | Freq: Two times a day (BID) | ORAL | Status: DC
  Administered 2018-12-22 – 2018-12-28 (×8): 60 mg via ORAL
  Filled 2018-12-22 (×9): qty 1

## 2018-12-22 MED ORDER — DOCUSATE SODIUM 100 MG PO CAPS: 100.0000 mg | ORAL_CAPSULE | Freq: Two times a day (BID) | ORAL | Status: DC | PRN

## 2018-12-22 MED ORDER — LABETALOL HCL 5 MG/ML IV SOLN
5.0000 mg | INTRAVENOUS | Status: AC | PRN
  Administered 2018-12-22 (×3): 5 mg via INTRAVENOUS
  Filled 2018-12-22: qty 4

## 2018-12-22 MED ORDER — AMLODIPINE BESYLATE 5 MG PO TABS
5.0000 mg | ORAL_TABLET | Freq: Every day | ORAL | Status: DC
  Administered 2018-12-22 – 2018-12-23 (×2): 5 mg via ORAL
  Filled 2018-12-22 (×2): qty 1

## 2018-12-22 MED ORDER — METOPROLOL SUCCINATE ER 25 MG PO TB24
25.0000 mg | ORAL_TABLET | Freq: Every day | ORAL | Status: DC
  Administered 2018-12-22 – 2018-12-24 (×3): 25 mg via ORAL
  Filled 2018-12-22 (×4): qty 1

## 2018-12-22 MED ORDER — PRAVASTATIN SODIUM 20 MG PO TABS
20.0000 mg | ORAL_TABLET | Freq: Every day | ORAL | Status: DC
  Administered 2018-12-23 – 2018-12-27 (×3): 20 mg via ORAL
  Filled 2018-12-22 (×4): qty 1

## 2018-12-22 MED ORDER — SERTRALINE HCL 50 MG PO TABS
50.0000 mg | ORAL_TABLET | Freq: Every day | ORAL | Status: DC
  Administered 2018-12-22 – 2018-12-28 (×5): 50 mg via ORAL
  Filled 2018-12-22 (×6): qty 1

## 2018-12-22 MED ORDER — HYDROCORTISONE (PERIANAL) 2.5 % EX CREA
1.0000 "application " | TOPICAL_CREAM | Freq: Two times a day (BID) | CUTANEOUS | Status: DC | PRN
  Filled 2018-12-22: qty 28.35

## 2018-12-22 MED ORDER — LABETALOL HCL 5 MG/ML IV SOLN
10.0000 mg | INTRAVENOUS | Status: DC | PRN
  Administered 2018-12-22 – 2018-12-28 (×31): 10 mg via INTRAVENOUS
  Filled 2018-12-22 (×21): qty 4

## 2018-12-22 MED ORDER — NITROGLYCERIN 0.4 MG SL SUBL: 0.4000 mg | SUBLINGUAL_TABLET | SUBLINGUAL | Status: DC | PRN

## 2018-12-22 MED ORDER — LEVOTHYROXINE SODIUM 75 MCG PO TABS
75.0000 ug | ORAL_TABLET | Freq: Every day | ORAL | Status: DC
  Administered 2018-12-23 – 2018-12-28 (×3): 75 ug via ORAL
  Filled 2018-12-22 (×5): qty 1

## 2018-12-22 MED ORDER — DEXTROSE 5 % IV SOLN
INTRAVENOUS | Status: DC
  Administered 2018-12-22 – 2018-12-27 (×5): via INTRAVENOUS

## 2018-12-22 NOTE — Evaluation (Signed)
SLP Cancellation Note  Patient Details Name: Leonard Shepard MRN: [id number] DOB: 12-03-1922   Cancelled treatment:       Reason Eval/Treat Not Completed: Other (comment);Patient not medically ready(pt currently with high oxygen needs and low grade temp and on 14 Liters of oxygen, Imaging chest showed "Pneumonia superimposed on chronic changes of interstitial lungdisease. Aspiration is also considered given history of vomiting.")  Note h/o esophageal dysmotility per MD notes.   Spoke to Advertising account executive and advised would follow up 12/23/2018. Thanks for this consult.   Macario Golds 12/22/2018, 2:43 PM

## 2018-12-22 NOTE — Progress Notes (Signed)
Pt less agitated after 1 dose of Haldol, however BP remains 200's/100's. NP on call notified.

## 2018-12-22 NOTE — Progress Notes (Signed)
PROGRESS NOTE    [name]  [id number] DOB: [birth year] DOA: 12/21/2018 PCP: Hennie Duos, MD   Brief Narrative:  Leonard Shepard is a 85 y.o. male with medical history significant for CAD, pulmonary fibrosis on home O2 at 3L/min  chronic PE no longer on coumadin due to severe GI bleed in 2018, HTN, HLD, solitary kidney s/p R nephrectomy, CKD stage IV who presents to the ED complaining of chills, projectile vomiting, worsening shortness of breath, with sats in the 79s.  Patient has history of aspiration pneumonia multiple times in the past.  He is usually independent, alert and oriented at baseline, ambulates of the with help of walker and lives with his son.  He son tested positive for Covid-19 about 10 days ago but his son says that he isolated himself pretty well.  Covid-19 test negative twice.  CT chest showed right-sided and bibasilar pneumonia on the background of his  interstitial lung disease.  Patient being currently managed with IV antibiotics for aspiration pneumonia.   Assessment & Plan:   Principal Problem:   Sepsis ([place]) Active Problems:   Chronic pulmonary embolism (HCC)   Aspiration pneumonia (HCC)   Acquired hypothyroidism   History of adrenal insufficiency   Postinflammatory pulmonary fibrosis (HCC)   H/O unilateral nephrectomy   Acute renal failure superimposed on stage 4 chronic kidney disease (HCC)   Acute on chronic hypoxic respiratory failure: Secondary to pneumonia.  Has history of recurrent aspiration pneumonia in the past.  Has history of pulmonary fibrosis and he is on 3 L of oxygen at home.  Currently on 3 L of oxygen.  Aspiration pneumonia: CT chest showed bibasilar pneumonia.  Chest x-ray showed right-sided pneumonia.  Continue broad-spectrum antibiotics for now.  He is still febrile.  Has leukocytosis.  Follow-up cultures.  I have requested speech therapy for evaluation. He has history of aspiration pneumonia in the past and was admitted.  He has  history of esophageal dysmotility as per son.  He was nauseated and vomiting at home.  Suspicion for Covid-19: Test negative twice.  Son was diagnosed with Covid about 10 days ago.  Patient lives with son.  As per son, he had isolated himself.  Continue airborne precaution as per ID.  Elevated troponin: Has history of coronary disease.  Denies any chest pain presentation.  Troponin peaked to 1900s.  EKG did not show any acute ST changes.  Recent echocardiogram showed ejection fraction of 60-65%, no wall motion abnormality.  Cardiology was consulted, suspected demand ischemia, no further work-up recommended.  Hypertensive urgency: Severely hypertensive on presentation.  Will restart his home medicines.  Continue as needed meds  Elevated D-dimer: Could be multifactorial.  History of PE and was on Coumadin in the past but he stopped due to GI bleed.  VQ scan has been ordered but I do not know how much helpful this test will be.  History of adrenal insufficiency: On Florinef and steroid at home.  Will hold this because he is hypertensive.  CKD stage IV/history of right nephrectomy: Currently kidney function is at baseline.  Constipation: Continue aggressive bowel regimen.         DVT prophylaxis: Heparin [place] Code Status: DNR Family Communication: Discussed with son Disposition Plan: Likely back to home after clinical improvement   Consultants: Cardiology  Procedures: None  Antimicrobials:  Anti-infectives (From admission, onward)   Start     Dose/Rate Route Frequency Ordered Stop   12/23/18 1600  vancomycin (VANCOCIN) 1,500 mg in sodium  chloride 0.9 % 500 mL IVPB     1,500 mg 250 mL/hr over 120 Minutes Intravenous Every 48 hours 12/21/18 1318     12/21/18 1400  ceFEPIme (MAXIPIME) 2 g in sodium chloride 0.9 % 100 mL IVPB     2 g 200 mL/hr over 30 Minutes Intravenous Every 24 hours 12/21/18 1316     12/21/18 1300  ceFEPIme (MAXIPIME) 2 g in sodium chloride 0.9 % 100 mL IVPB  Status:   Discontinued     2 g 200 mL/hr over 30 Minutes Intravenous Every 12 hours 12/21/18 1246 12/21/18 1316   12/21/18 1300  vancomycin (VANCOCIN) 2,000 mg in sodium chloride 0.9 % 500 mL IVPB     2,000 mg 250 mL/hr over 120 Minutes Intravenous  Once 12/21/18 1250 12/21/18 1639   12/21/18 0945  cefTRIAXone (ROCEPHIN) 2 g in sodium chloride 0.9 % 100 mL IVPB  Status:  Discontinued     2 g 200 mL/hr over 30 Minutes Intravenous Every 24 hours 12/21/18 0937 12/21/18 1247   12/21/18 0945  azithromycin (ZITHROMAX) 500 mg in sodium chloride 0.9 % 250 mL IVPB  Status:  Discontinued     500 mg 250 mL/hr over 60 Minutes Intravenous Every 24 hours 12/21/18 0937 12/21/18 1247      Subjective:  Patient seen and examined the bedside this morning.  Was severely hypertensive during my evaluation.  Also febrile.  Confused and disoriented.  Was on 3 L oxygen during my evaluation.  Not in acute respiratory distress.  Had extensive discussion with son on phone today.  Objective: Vitals:   12/22/18 1006 12/22/18 1016 12/22/18 1100 12/22/18 1134  BP: (!) 166/115 (!) 169/84 (!) 177/87   Pulse: 82 74 78 73  Resp: (!) 23 (!) 23 (!) 22 (!) 24  Temp: (!) 100.9 F (38.3 C) (!) 100.9 F (38.3 C) (!) 100.9 F (38.3 C) (!) 100.8 F (38.2 C)  TempSrc:      SpO2: 94% 96% 91% 98%  Weight:      Height:        Intake/Output Summary (Last 24 hours) at 12/22/2018 1157 Last data filed at 12/22/2018 0946 Gross per 24 hour  Intake 500 ml  Output 525 ml  Net -25 ml   Filed Weights   12/21/18 1235 12/21/18 1315 12/22/18 0500  Weight: 93.4 kg 93.6 kg 92.7 kg    Examination:  General exam: Not in distress,average built, elderly male HEENT:PERRL,Oral mucosa dry, Ear/Nose normal on gross exam Respiratory system: Bilateral decreased air entry Cardiovascular system: S1 & S2 heard, RRR. No JVD, murmurs, rubs, gallops or clicks. No pedal edema. Gastrointestinal system: Abdomen is nondistended, soft and nontender. No  organomegaly or masses felt. Normal bowel sounds heard. Central nervous system: Alert, awake but not oriented  extremities: No edema, no clubbing ,no cyanosis, distal peripheral pulses palpable. Skin: No rashes, lesions or ulcers,no icterus ,no pallor    Data Reviewed: I have personally reviewed following labs and imaging studies  CBC: Recent Labs  Lab 12/21/18 0930 12/22/18 0228  WBC 13.6* 18.1*  NEUTROABS 11.1*  --   HGB 12.2* 12.3*  HCT 38.5* 39.4  MCV 94.8 96.1  PLT 222 [id number]   Basic Metabolic Panel: Recent Labs  Lab 12/21/18 0930 12/22/18 0228  NA 139 142  K 3.4* 4.7  CL 104 109  CO2 24 21*  GLUCOSE 108* 106*  BUN 34* 37*  CREATININE 2.16* 2.45*  CALCIUM 8.6* 8.3*   GFR: Estimated Creatinine Clearance: 20.2  mL/min (A) (by C-G formula based on SCr of 2.45 mg/dL (H)). Liver Function Tests: Recent Labs  Lab 12/21/18 0930 12/22/18 0228  AST 24 54*  ALT 13 17  ALKPHOS 81 59  BILITOT 0.8 0.8  PROT 6.7 6.4*  ALBUMIN 3.5 3.1*   No results for input(s): LIPASE, AMYLASE in the last 168 hours. No results for input(s): AMMONIA in the last 168 hours. Coagulation Profile: No results for input(s): INR, PROTIME in the last 168 hours. Cardiac Enzymes: No results for input(s): CKTOTAL, CKMB, CKMBINDEX, TROPONINI in the last 168 hours. BNP (last 3 results) No results for input(s): PROBNP in the last 8760 hours. HbA1C: No results for input(s): HGBA1C in the last 72 hours. CBG: No results for input(s): GLUCAP in the last 168 hours. Lipid Profile: Recent Labs    12/21/18 0930  TRIG 129   Thyroid Function Tests: No results for input(s): TSH, T4TOTAL, FREET4, T3FREE, THYROIDAB in the last 72 hours. Anemia Panel: Recent Labs    12/21/18 0930  FERRITIN 35   Sepsis Labs: Recent Labs  Lab 12/21/18 0930 12/21/18 1114 12/21/18 1855 12/21/18 2107  PROCALCITON <0.10  --   --   --   LATICACIDVEN 2.6* 3.9* 2.5* 3.1*    Recent Results (from the past 240 hour(s))   Urine culture     Status: None   Collection Time: 12/21/18  9:16 AM   Specimen: Urine, Catheterized  Result Value Ref Range Status   Specimen Description   Final    URINE, CATHETERIZED Performed at [facility], [place] [address], [place], [place] [address]    Special Requests   Final    NONE Performed at [facility], [place] [address] [place], [place] [address]    Culture   Final    NO GROWTH Performed at [facility], [place] [address] [place], [place] [address]    Report Status 12/22/2018 FINAL  Final  Blood Culture (routine x 2)     Status: None (Preliminary result)   Collection Time: 12/21/18  9:30 AM   Specimen: BLOOD  Result Value Ref Range Status   Specimen Description   Final    BLOOD RIGHT ANTECUBITAL Performed at [facility] [address], [place], [place] [address]    Special Requests   Final    BOTTLES DRAWN AEROBIC AND ANAEROBIC Blood Culture adequate volume Performed at [facility] [address] [place], [place] [address]    Culture   Final    NO GROWTH < 24 HOURS Performed at [facility] [address], [place], [place] [address]    Report Status PENDING  Incomplete  SARS Coronavirus 2 by RT PCR (hospital order, performed in [facility] hospital lab) Nasopharyngeal Nasopharyngeal Swab     Status: None   Collection Time: 12/21/18  9:46 AM   Specimen: Nasopharyngeal Swab  Result Value Ref Range Status   SARS Coronavirus 2 NEGATIVE NEGATIVE Final    Comment: (NOTE) If result is NEGATIVE SARS-CoV-2 target nucleic acids are NOT DETECTED. The SARS-CoV-2 RNA is generally detectable in upper and lower  respiratory specimens during the acute phase of infection. The lowest  concentration of SARS-CoV-2 viral copies this assay can detect is 250  copies / mL. A negative result does not preclude SARS-CoV-2 infection  and should not be used as the sole basis for treatment or  other  patient management decisions.  A negative result may occur with  improper specimen collection / handling, submission of specimen other  than nasopharyngeal swab, presence of viral mutation(s) within the  areas targeted by this assay, and inadequate number of viral copies  (<250 copies / mL). A negative result must be combined with clinical  observations, patient history, and epidemiological information. If result is POSITIVE SARS-CoV-2 target nucleic acids are DETECTED. The SARS-CoV-2 RNA is generally detectable in upper and lower  respiratory specimens dur ing the acute phase of infection.  Positive  results are indicative of active infection with SARS-CoV-2.  Clinical  correlation with patient history and other diagnostic information is  necessary to determine patient infection status.  Positive results do  not rule out bacterial infection or co-infection with other viruses. If result is PRESUMPTIVE POSTIVE SARS-CoV-2 nucleic acids MAY BE PRESENT.   A presumptive positive result was obtained on the submitted specimen  and confirmed on repeat testing.  While 2019 novel coronavirus  (SARS-CoV-2) nucleic acids may be present in the submitted sample  additional confirmatory testing may be necessary for epidemiological  and / or clinical management purposes  to differentiate between  SARS-CoV-2 and other Sarbecovirus currently known to infect humans.  If clinically indicated additional testing with an alternate test  methodology [id number]) is advised. The SARS-CoV-2 RNA is generally  detectable in upper and lower respiratory sp ecimens during the acute  phase of infection. The expected result is Negative. Fact Sheet for Patients:  StrictlyIdeas.no Fact Sheet for Healthcare Providers: BankingDealers.co.za This test is not yet approved or cleared by the [place] FDA and has been authorized for detection and/or diagnosis of  SARS-CoV-2 by FDA under an Emergency Use Authorization (EUA).  This EUA will remain in effect (meaning this test can be used) for the duration of the COVID-19 declaration under Section 564(b)(1) of the Act, 21 U.S.C. section 360bbb-3(b)(1), unless the authorization is terminated or revoked sooner. Performed at [facility], [place] [address] [place], [place] [address]   Blood Culture (routine x 2)     Status: None (Preliminary result)   Collection Time: 12/21/18 10:00 AM   Specimen: BLOOD  Result Value Ref Range Status   Specimen Description   Final    BLOOD LEFT WRIST Performed at [facility] [address] [place], [place] [address]    Special Requests   Final    BOTTLES DRAWN AEROBIC AND ANAEROBIC Blood Culture adequate volume Performed at [facility] [address], [place], [place] [address]    Culture   Final    NO GROWTH < 24 HOURS Performed at [facility] [address], [place], [place] [address]    Report Status PENDING  Incomplete  MRSA PCR Screening     Status: None   Collection Time: 12/21/18  5:00 PM   Specimen: Nasopharyngeal Swab  Result Value Ref Range Status   MRSA by PCR NEGATIVE NEGATIVE Final    Comment:        The GeneXpert MRSA Assay (FDA approved for NASAL specimens only), is one component of a comprehensive MRSA colonization surveillance program. It is not intended to diagnose MRSA infection nor to guide or monitor treatment for MRSA infections. Performed at [facility], [place] [address] [place], [place] [address]   SARS CORONAVIRUS 2 (TAT 6-24 HRS) Nasopharyngeal Nasopharyngeal Swab     Status: None   Collection Time: 12/21/18  5:46 PM   Specimen: Nasopharyngeal Swab  Result Value Ref Range Status  SARS Coronavirus 2 NEGATIVE NEGATIVE Final    Comment: (NOTE) SARS-CoV-2 target nucleic acids are NOT DETECTED. The SARS-CoV-2 RNA is generally detectable in  upper and lower respiratory specimens during the acute phase of infection. Negative results do not preclude SARS-CoV-2 infection, do not rule out co-infections with other pathogens, and should not be used as the sole basis for treatment or other patient management decisions. Negative results must be combined with clinical observations, patient history, and epidemiological information. The expected result is Negative. Fact Sheet for Patients: SugarRoll.be Fact Sheet for Healthcare Providers: https://www.woods-mathews.com/ This test is not yet approved or cleared by the [place] FDA and  has been authorized for detection and/or diagnosis of SARS-CoV-2 by FDA under an Emergency Use Authorization (EUA). This EUA will remain  in effect (meaning this test can be used) for the duration of the COVID-19 declaration under Section 56 4(b)(1) of the Act, 21 U.S.C. section 360bbb-3(b)(1), unless the authorization is terminated or revoked sooner. Performed at [facility], [place] [address], [place], [place] [address]          Radiology Studies: Ct Abdomen Pelvis Wo Contrast  Result Date: 12/21/2018 CLINICAL DATA:  Nausea vomiting since yesterday, confusion. Limited evaluation due to patient condition. EXAM: CT ABDOMEN AND PELVIS WITHOUT CONTRAST TECHNIQUE: Multidetector CT imaging of the abdomen and pelvis was performed following the standard protocol without IV contrast. COMPARISON:  11/08/2016 FINDINGS: Lower chest: Superimposed airspace disease is present upon a background of interstitial lung disease. To a lesser extent similar changes are seen in the medial left lung base. No signs of pleural effusion. Extensive coronary artery calcifications and evidence of aortic atherosclerosis with stable dilation of the descending thoracic aorta to 3.6 cm. Heart size is enlarged without pericardial effusion. Central pulmonary vasculature is engorged at 3.8  cm. Hepatobiliary: Lobular hepatic contours. No signs of focal lesion. Post cholecystectomy. No signs of biliary ductal dilation. Pancreas: Unremarkable. No pancreatic ductal dilatation or surrounding inflammatory changes. Spleen: Normal in size without focal abnormality. Adrenals/Urinary Tract: Adrenal glands are normal. Post right nephrectomy. Renal cortical scarring noted on the left. No signs of left-sided hydronephrosis or perinephric fluid. Stomach/Bowel: No signs of bowel obstruction. Large volume stool is however present in the rectum but with similar appearance to prior exam. Colonic diverticulosis. Large duodenal diverticulum unchanged. Vascular/Lymphatic: Marked vascular disease with moderate to marked dilation of the infrarenal abdominal aorta with fusiform morphology measuring 4.4 x 4.3 cm compared to 3.8 x 3.8 cm in 2018. No periaortic stranding or hematoma. No signs of adenopathy in the retroperitoneum or in the upper abdomen. No signs of pelvic lymphadenopathy. Reproductive: Foley catheter in situ, circumferential thickening of the urinary bladder, nonspecific finding. Other: No signs of free air. No ascites. Musculoskeletal: Extensive spinal degenerative changes are similar to the previous exam. Skis be no acute bone finding or destructive bone process. IMPRESSION: Pneumonia superimposed on chronic changes of interstitial lung disease. Aspiration is also considered given history of vomiting. Marked vascular disease with stable dilation of the descending thoracic aorta to 3.6 cm. Infrarenal abdominal aortic aneurysm measuring 4.4 x 4.3 cm compared to 3.8 x 3.8 cm in 2018. Recommend followup by ultrasound in 1 year. This recommendation follows ACR consensus guidelines: White Paper of the ACR Incidental Findings Committee II on Vascular Findings. J Am Coll Radiol 2013; 10:789-794. Aortic aneurysm NOS (ICD10-I71.9) Coronary artery disease and signs of pulmonary arterial hypertension. Aortic  Atherosclerosis (ICD10-I70.0). Electronically Signed   By: Jewel Baize.D.  On: 12/21/2018 15:32   Dg Chest Port 1 View  Result Date: 12/21/2018 CLINICAL DATA:  Cough and shortness of breath. Negative COVID-19 test. EXAM: PORTABLE CHEST 1 VIEW COMPARISON:  PA and lateral chest 06/10/2015, 11/08/2016 and 11/10/2016. FINDINGS: There is right upper lobe and bibasilar airspace disease. No pneumothorax. There are likely small bilateral pleural effusions. Cardiomegaly and atherosclerosis are identified. No acute or focal bony abnormality. IMPRESSION: Right upper lobe and bibasilar airspace disease most worrisome for pneumonia. Cardiomegaly. Atherosclerosis. Electronically Signed   By: Inge Rise M.D.   On: 12/21/2018 11:38   Dg Abd Portable 1 View  Result Date: 12/21/2018 CLINICAL DATA:  Cough, shortness of breath and renal failure. EXAM: PORTABLE ABDOMEN - 1 VIEW COMPARISON:  CT abdomen and pelvis 11/08/2016. FINDINGS: The bowel gas pattern is normal. Moderately large stool ball in the rectosigmoid colon noted. No radio-opaque calculi or other significant radiographic abnormality are seen. The patient is status post cholecystectomy. Multilevel lumbar spondylosis is seen. IMPRESSION: No acute finding. Moderately large stool ball in the rectosigmoid colon. Electronically Signed   By: Inge Rise M.D.   On: 12/21/2018 11:39        Scheduled Meds: . amLODipine  5 mg Oral Daily  . aspirin  81 mg Oral Daily  . Chlorhexidine Gluconate Cloth  6 each Topical Daily  . heparin  5,000 Units Subcutaneous Q8H  . isosorbide mononitrate  60 mg Oral BID  . [START ON 12/23/2018] levothyroxine  75 mcg Oral Daily  . mouth rinse  15 mL Mouth Rinse BID  . metoprolol succinate  25 mg Oral Daily  . pravastatin  20 mg Oral q1800  . senna  1 tablet Oral BID  . sertraline  50 mg Oral Daily   Continuous Infusions: . ceFEPime (MAXIPIME) IV    . dextrose    . [START ON 12/23/2018] vancomycin        LOS: 1 day    Time spent: 35 mins.More than 50% of that time was spent in counseling and/or coordination of care.      Shelly Coss, MD [facility] Pager [phone]  If 7PM-7AM, please contact night-coverage [email or web address] Password [email or web address] 12/22/2018, 11:57 AM

## 2018-12-23 LAB — CBC WITH DIFFERENTIAL/PLATELET
Abs Immature Granulocytes: 0.14 10*3/uL — ABNORMAL HIGH (ref 0.00–0.07)
Basophils Absolute: 0 10*3/uL (ref 0.0–0.1)
Basophils Relative: 0 %
Eosinophils Absolute: 0 10*3/uL (ref 0.0–0.5)
Eosinophils Relative: 0 %
HCT: 37 % — ABNORMAL LOW (ref 39.0–52.0)
Hemoglobin: 11.5 g/dL — ABNORMAL LOW (ref 13.0–17.0)
Immature Granulocytes: 1 %
Lymphocytes Relative: 9 %
Lymphs Abs: 1.3 10*3/uL (ref 0.7–4.0)
MCH: 30.2 pg (ref 26.0–34.0)
MCHC: 31.1 g/dL (ref 30.0–36.0)
MCV: 97.1 fL (ref 80.0–100.0)
Monocytes Absolute: 0.9 10*3/uL (ref 0.1–1.0)
Monocytes Relative: 6 %
Neutro Abs: 12.3 10*3/uL — ABNORMAL HIGH (ref 1.7–7.7)
Neutrophils Relative %: 84 %
Platelets: 175 10*3/uL (ref 150–400)
RBC: 3.81 MIL/uL — ABNORMAL LOW (ref 4.22–5.81)
RDW: 15 % (ref 11.5–15.5)
WBC: 14.7 10*3/uL — ABNORMAL HIGH (ref 4.0–10.5)
nRBC: 0 % (ref 0.0–0.2)

## 2018-12-23 LAB — BASIC METABOLIC PANEL
Anion gap: 14 (ref 5–15)
BUN: 52 mg/dL — ABNORMAL HIGH (ref 8–23)
CO2: 21 mmol/L — ABNORMAL LOW (ref 22–32)
Calcium: 8.5 mg/dL — ABNORMAL LOW (ref 8.9–10.3)
Chloride: 109 mmol/L (ref 98–111)
Creatinine, Ser: 2.57 mg/dL — ABNORMAL HIGH (ref 0.61–1.24)
GFR calc Af Amer: 24 mL/min — ABNORMAL LOW (ref >60)
GFR calc non Af Amer: 20 mL/min — ABNORMAL LOW (ref >60)
Glucose, Bld: 109 mg/dL — ABNORMAL HIGH (ref 70–99)
Potassium: 4.4 mmol/L (ref 3.5–5.1)
Sodium: 144 mmol/L (ref 135–145)

## 2018-12-23 LAB — LEGIONELLA PNEUMOPHILA SEROGP 1 UR AG: L. pneumophila Serogp 1 Ur Ag: NEGATIVE

## 2018-12-23 LAB — GLUCOSE, CAPILLARY
Glucose-Capillary: 100 mg/dL — ABNORMAL HIGH (ref 70–99)
Glucose-Capillary: 104 mg/dL — ABNORMAL HIGH (ref 70–99)
Glucose-Capillary: 104 mg/dL — ABNORMAL HIGH (ref 70–99)
Glucose-Capillary: 89 mg/dL (ref 70–99)

## 2018-12-23 LAB — LACTIC ACID, PLASMA: Lactic Acid, Venous: 1.5 mmol/L (ref 0.5–1.9)

## 2018-12-23 MED ORDER — HYDROCORTISONE 5 MG PO TABS
5.0000 mg | ORAL_TABLET | Freq: Every day | ORAL | Status: DC
  Administered 2018-12-23 – 2018-12-24 (×2): 5 mg via ORAL
  Filled 2018-12-23 (×2): qty 1

## 2018-12-23 MED ORDER — AMLODIPINE BESYLATE 10 MG PO TABS
10.0000 mg | ORAL_TABLET | Freq: Every day | ORAL | Status: DC
  Administered 2018-12-24 – 2018-12-28 (×3): 10 mg via ORAL
  Filled 2018-12-23 (×4): qty 1

## 2018-12-23 MED ORDER — HYDRALAZINE HCL 25 MG PO TABS
25.0000 mg | ORAL_TABLET | Freq: Three times a day (TID) | ORAL | Status: DC
  Administered 2018-12-23 – 2018-12-28 (×9): 25 mg via ORAL
  Filled 2018-12-23 (×10): qty 1

## 2018-12-23 MED ORDER — HYDROCORTISONE 5 MG PO TABS: 5.0000 mg | ORAL_TABLET | ORAL | Status: DC

## 2018-12-23 MED ORDER — HYDROCORTISONE 10 MG PO TABS
10.0000 mg | ORAL_TABLET | Freq: Every day | ORAL | Status: DC
  Administered 2018-12-24: 10 mg via ORAL
  Filled 2018-12-23 (×2): qty 1

## 2018-12-23 MED ORDER — FLUDROCORTISONE ACETATE 0.1 MG PO TABS
0.1000 mg | ORAL_TABLET | Freq: Every day | ORAL | Status: DC
  Administered 2018-12-23 – 2018-12-24 (×2): 0.1 mg via ORAL
  Filled 2018-12-23 (×3): qty 1

## 2018-12-23 NOTE — Evaluation (Signed)
Clinical/Bedside Swallow Evaluation Patient Details  Name: Leonard Shepard MRN: [id number] Date of Birth: 09-18-1922  Today's Date: 12/23/2018 Time: SLP Start Time (ACUTE ONLY): 1525 SLP Stop Time (ACUTE ONLY): 1540 SLP Time Calculation (min) (ACUTE ONLY): 15 min  Past Medical History:  Past Medical History:  Diagnosis Date  . Acute renal failure superimposed on stage 4 chronic kidney disease ([place]) 11/16/2016  . Anemia due to acute blood loss   . Aspiration pneumonia ([place])   . BPH (benign prostatic hyperplasia)   . CAD (coronary artery disease)    s/p stent  . Chronic renal insufficiency, stage III (moderate)   . CKD (chronic kidney disease)   . COPD (chronic obstructive pulmonary disease) ([place])   . Depression   . Diverticulitis of colon with bleeding   . GERD (gastroesophageal reflux disease)   . H/O unilateral nephrectomy   . Heart attack ([place])   . HLD (hyperlipidemia)   . Lower GI bleed 11/20/2016  . Oropharyngeal dysphagia 11/10/2016  . PE (pulmonary embolism)   . Postinflammatory pulmonary fibrosis ([place])   . Rotator cuff tear, right    Past Surgical History:  Past Surgical History:  Procedure Laterality Date  . APPENDECTOMY    . CHOLECYSTECTOMY    . CIRCUMCISION    . COLONOSCOPY    . heart stint    . INGUINAL HERNIA REPAIR Right   . KNEE SURGERY Right    Pt unsure of what the Dr did, his knee had collasped on him  . lumbar back surgery     x 2  . NEPHRECTOMY Right    3 surgeries on this kidney before finally removing it  . SHOULDER SURGERY Left    for spurs  . TONSILLECTOMY AND ADENOIDECTOMY     HPI:  85 y.o. male with medical history significant for CAD, pulmonary fibrosis on home O2 at 3L/min, chronic PE, HTN, HLD, solitary kidney s/p R nephrectomy, CKD stage IV who presents to the ED complaining of chills, projectile vomiting, worsening shortness of breath, with sats in the 60s.  Patient has history of aspiration pneumonia multiple times in the past.  MBS in  2018 revealed backflow of POs from esophagus into pyriforms; he was at heightened risk for post-prandial aspiration. He is usually independent, alert and oriented at baseline, ambulates with help of walker and lives with his son, who recently tested positive for COVID. CT chest showed right-sided and bibasilar pneumonia on the background of his  interstitial lung disease. Dx acute on chronic resp failure, aspiration pna, sepsis.    Assessment / Plan / Recommendation Clinical Impression  Pt sleepy but arousable for clinical swallow assessment.  Daughter present.  Sp02 low 90s; RR compatible with adequate swallowing/respiratory coordination.  Repositioned in bed to optimize performance.  Presented with adequate anticipation/recognition of approaching cup and spoon; adequate oral manipulation of pureed solids with subsequent swallow and no s/s of aspiration. However, trials of ice chips and water led to consistent coughing response, concerning for aspiration.  D/W daughter the benefits of maintaining NPO for another day (excluding meds in puree and occasional bites of applesauce/pudding from floor stock).  Aspiration of ice chips/water likely at this time - SLP will f/u next date for to assess for improvements/PO readiness.   SLP Visit Diagnosis: Dysphagia, unspecified (R13.10)    Aspiration Risk  Mild aspiration risk    Diet Recommendation   npo except meds in puree, teaspoons of applesauce/pudding from floor stock when upright/alert  Medication Administration: Whole meds with  puree    Other  Recommendations Oral Care Recommendations: Oral care QID   Follow up Recommendations Other (comment)(tba)      Frequency and Duration min 2x/week  2 weeks       Prognosis Prognosis for Safe Diet Advancement: Guarded      Swallow Study   General HPI: 85 y.o. male with medical history significant for CAD, pulmonary fibrosis on home O2 at 3L/min, chronic PE, HTN, HLD, solitary kidney s/p R nephrectomy,  CKD stage IV who presents to the ED complaining of chills, projectile vomiting, worsening shortness of breath, with sats in the 60s.  Patient has history of aspiration pneumonia multiple times in the past.  MBS in 2018 revealed backflow of POs from esophagus into pyriforms; he was at heightened risk for post-prandial aspiration. He is usually independent, alert and oriented at baseline, ambulates with help of walker and lives with his son, who recently tested positive for COVID. CT chest showed right-sided and bibasilar pneumonia on the background of his  interstitial lung disease. Dx acute on chronic resp failure, aspiration pna, sepsis.  Type of Study: Bedside Swallow Evaluation Previous Swallow Assessment: MBS 11/2016: Mild oropharyngeal dysphagia.  The oral phase was characterized by delayed oral transit given dual textured solids.  The pharyngeal phase was characterized by a delayed swallow trigger with thin and nectar thick liquids triggering at the pyriform sinuses and pureed material and dual textured solids triggering at the vallecular.  Of note, following the initial strip the patient was noted to clear the pharyngeal space of all material but backflow from the esophagus was noted to the level of the pyriform sinuses in some cases leading to severe residue.  Penetration to the cords was noted prior to the swallow given tsp sips of thin and nectar thick liquids.  Use of a chin tuck eliminated this.  Esophageal did not reveal other overt issues.  Recommend a regular diet with thin liquids.  The patient MUST TUCK CHIN for all liquid swallows. Diet Prior to this Study: NPO Temperature Spikes Noted: No Respiratory Status: Nasal cannula(5  liters) History of Recent Intubation: No Behavior/Cognition: Lethargic/Drowsy Oral Cavity Assessment: Within Functional Limits Oral Care Completed by SLP: No Oral Cavity - Dentition: Missing dentition Self-Feeding Abilities: Total assist Patient Positioning: Upright  in bed Baseline Vocal Quality: Normal Volitional Cough: Strong Volitional Swallow: Able to elicit    Oral/Motor/Sensory Function Overall Oral Motor/Sensory Function: Within functional limits   Ice Chips Ice chips: Impaired Pharyngeal Phase Impairments: Cough - Immediate   Thin Liquid Thin Liquid: Impaired Presentation: Cup;Straw Pharyngeal  Phase Impairments: Cough - Immediate    Nectar Thick Nectar Thick Liquid: Not tested   Honey Thick Honey Thick Liquid: Not tested   Puree Puree: Within functional limits   Solid     Solid: Not tested      Juan Quam Laurice 12/23/2018,3:56 PM   Estill Bamberg L. Tivis Ringer, [place] Office number [phone] Pager [phone]

## 2018-12-23 NOTE — Progress Notes (Signed)
PROGRESS NOTE    Leonard Shepard  [id number] DOB: 1922-09-23 DOA: 12/21/2018 PCP: Hennie Duos, MD   Brief Narrative:  Leonard Shepard is a 85 y.o. male with medical history significant for CAD, pulmonary fibrosis on home O2 at 3L/min  chronic PE no longer on coumadin due to severe GI bleed in 2018, HTN, HLD, solitary kidney s/p R nephrectomy, CKD stage IV who presents to the ED complaining of chills, projectile vomiting, worsening shortness of breath, with sats in the 63s.  Patient has history of aspiration pneumonia multiple times in the past.  He is usually independent, alert and oriented at baseline, ambulates of the with help of walker and lives with his son.  He son tested positive for Covid-19 about 10 days ago but his son says that he isolated himself pretty well.  Covid-19 test negative twice.  CT chest showed right-sided and bibasilar pneumonia on the background of his  interstitial lung disease.  Patient being currently managed with IV antibiotics for aspiration pneumonia.   Assessment & Plan:   Principal Problem:   Sepsis ([place]) Active Problems:   Chronic pulmonary embolism (HCC)   Aspiration pneumonia (HCC)   Acquired hypothyroidism   History of adrenal insufficiency   Postinflammatory pulmonary fibrosis (HCC)   H/O unilateral nephrectomy   Acute renal failure superimposed on stage 4 chronic kidney disease (HCC)   Acute on chronic hypoxic respiratory failure: Secondary to pneumonia.  Has history of recurrent aspiration pneumonia in the past.  Has history of pulmonary fibrosis and he is on 3 L of oxygen at home.  Currently on 8L of oxygen.We will try to wean.  Aspiration pneumonia: CT chest showed bibasilar pneumonia.  Chest x-ray showed right-sided pneumonia.  Continue broad-spectrum antibiotics for now.  He is still febrile.  Has leukocytosis.  Follow-up cultures.  I have requested speech therapy for evaluation. He has history of aspiration pneumonia in the past and was  admitted.  He has history of esophageal dysmotility as per son.  He was nauseated and vomiting at home.  Altered mental status: Secondary to altered metabolic encephalopathy due to pneumonia, hypoxia.  Continue to monitor.  On his baseline, he is alert and oriented as per son.  Suspicion for Covid-19: Test negative twice.  Son was diagnosed with Covid about 10 days ago.  Patient lives with son.  As per son, he had isolated himself.  Continue airborne precaution as per ID.  Elevated troponin: Has history of coronary disease.  Denies any chest pain presentation.  Troponin peaked to 1900s.  EKG did not show any acute ST changes.  Recent echocardiogram showed ejection fraction of 60-65%, no wall motion abnormality.  Cardiology was consulted, suspected demand ischemia, no further work-up recommended.  Hypertensive urgency: Severely hypertensive on presentation.  Will restart his home medicines.  Continue as needed meds  Elevated D-dimer: Could be multifactorial.  History of PE and was on Coumadin in the past but he stopped due to GI bleed.  VQ scan has been ordered but I do not know how much helpful this test will be.  History of adrenal insufficiency: On Florinef and steroid at home.  Will hold this because he is hypertensive.  CKD stage IV/history of right nephrectomy: Currently kidney function is at baseline.  Constipation:Had bowel movements. Continue  bowel regimen.  Deconditioning/debility/advanced age, multiple comorbidities: Have requested for palliative care consultation.  If his respiratory status does not improve or if he declines further, he is a candidate for hospice.  DVT prophylaxis: Heparin [place] Code Status: DNR Family Communication: Discussed with son on phone on 12/23/18 Disposition Plan: Undetermined   Consultants: Cardiology  Procedures: None  Antimicrobials:  Anti-infectives (From admission, onward)   Start     Dose/Rate Route Frequency Ordered Stop    12/23/18 1600  vancomycin (VANCOCIN) 1,500 mg in sodium chloride 0.9 % 500 mL IVPB     1,500 mg 250 mL/hr over 120 Minutes Intravenous Every 48 hours 12/21/18 1318     12/21/18 1400  ceFEPIme (MAXIPIME) 2 g in sodium chloride 0.9 % 100 mL IVPB     2 g 200 mL/hr over 30 Minutes Intravenous Every 24 hours 12/21/18 1316     12/21/18 1300  ceFEPIme (MAXIPIME) 2 g in sodium chloride 0.9 % 100 mL IVPB  Status:  Discontinued     2 g 200 mL/hr over 30 Minutes Intravenous Every 12 hours 12/21/18 1246 12/21/18 1316   12/21/18 1300  vancomycin (VANCOCIN) 2,000 mg in sodium chloride 0.9 % 500 mL IVPB     2,000 mg 250 mL/hr over 120 Minutes Intravenous  Once 12/21/18 1250 12/21/18 1639   12/21/18 0945  cefTRIAXone (ROCEPHIN) 2 g in sodium chloride 0.9 % 100 mL IVPB  Status:  Discontinued     2 g 200 mL/hr over 30 Minutes Intravenous Every 24 hours 12/21/18 0937 12/21/18 1247   12/21/18 0945  azithromycin (ZITHROMAX) 500 mg in sodium chloride 0.9 % 250 mL IVPB  Status:  Discontinued     500 mg 250 mL/hr over 60 Minutes Intravenous Every 24 hours 12/21/18 0937 12/21/18 1247      Subjective:  Patient seen and examined the bedside this morning.  Still hypotensive.  Currently on 8 L of oxygen per minute.  Remains confused, disoriented, did not participate with any meaningful conversation.  Objective: Vitals:   12/23/18 0300 12/23/18 0500 12/23/18 0700 12/23/18 0800  BP: (!) 159/85  (!) 144/75 (!) 188/105  Pulse: 66  (!) 54 61  Resp: (!) 22  18 15   Temp:      TempSrc:      SpO2: 100%  100% 100%  Weight:  91.6 kg    Height:        Intake/Output Summary (Last 24 hours) at 12/23/2018 1103 Last data filed at 12/23/2018 0600 Gross per 24 hour  Intake 535.49 ml  Output 645 ml  Net -109.51 ml   Filed Weights   12/21/18 1315 12/22/18 0500 12/23/18 0500  Weight: 93.6 kg 92.7 kg 91.6 kg    Examination:  General exam: Chronically ill looking, elderly male HEENT:PERRL,Oral mucosa dry, Ear/Nose  normal on gross exam Respiratory system: Bilateral decreased air entry,basal crackles Cardiovascular system: S1 & S2 heard, RRR. No JVD, murmurs, rubs, gallops or clicks. No pedal edema. Gastrointestinal system: Abdomen is nondistended, soft and nontender. No organomegaly or masses felt. Normal bowel sounds heard. Central nervous system: Confused.sleepy ,drowsy  extremities: No edema, no clubbing ,no cyanosis, distal peripheral pulses palpable. Skin: No rashes, lesions or ulcers,no icterus ,no pallor    Data Reviewed: I have personally reviewed following labs and imaging studies  CBC: Recent Labs  Lab 12/21/18 0930 12/22/18 0228 12/23/18 0552  WBC 13.6* 18.1* 14.7*  NEUTROABS 11.1*  --  12.3*  HGB 12.2* 12.3* 11.5*  HCT 38.5* 39.4 37.0*  MCV 94.8 96.1 97.1  PLT 222 192 0000000   Basic Metabolic Panel: Recent Labs  Lab 12/21/18 0930 12/22/18 0228 12/23/18 0552  NA 139 142 144  K 3.4*  4.7 4.4  CL 104 109 109  CO2 24 21* 21*  GLUCOSE 108* 106* 109*  BUN 34* 37* 52*  CREATININE 2.16* 2.45* 2.57*  CALCIUM 8.6* 8.3* 8.5*   GFR: Estimated Creatinine Clearance: 19.3 mL/min (A) (by C-G formula based on SCr of 2.57 mg/dL (H)). Liver Function Tests: Recent Labs  Lab 12/21/18 0930 12/22/18 0228  AST 24 54*  ALT 13 17  ALKPHOS 81 59  BILITOT 0.8 0.8  PROT 6.7 6.4*  ALBUMIN 3.5 3.1*   No results for input(s): LIPASE, AMYLASE in the last 168 hours. No results for input(s): AMMONIA in the last 168 hours. Coagulation Profile: No results for input(s): INR, PROTIME in the last 168 hours. Cardiac Enzymes: No results for input(s): CKTOTAL, CKMB, CKMBINDEX, TROPONINI in the last 168 hours. BNP (last 3 results) No results for input(s): PROBNP in the last 8760 hours. HbA1C: No results for input(s): HGBA1C in the last 72 hours. CBG: No results for input(s): GLUCAP in the last 168 hours. Lipid Profile: Recent Labs    12/21/18 0930  TRIG 129   Thyroid Function Tests: No  results for input(s): TSH, T4TOTAL, FREET4, T3FREE, THYROIDAB in the last 72 hours. Anemia Panel: Recent Labs    12/21/18 0930  FERRITIN 35   Sepsis Labs: Recent Labs  Lab 12/21/18 0930 12/21/18 1114 12/21/18 1855 12/21/18 2107 12/23/18 0553  PROCALCITON <0.10  --   --   --   --   LATICACIDVEN 2.6* 3.9* 2.5* 3.1* 1.5    Recent Results (from the past 240 hour(s))  Urine culture     Status: None   Collection Time: 12/21/18  9:16 AM   Specimen: Urine, Catheterized  Result Value Ref Range Status   Specimen Description   Final    URINE, CATHETERIZED Performed at [facility], [place] [address], [place], [place] [address]    Special Requests   Final    NONE Performed at [facility], [place] [address] [place], [place] [address]    Culture   Final    NO GROWTH Performed at [facility], [place] [address], [place], [place] [address]    Report Status 12/22/2018 FINAL  Final  Blood Culture (routine x 2)     Status: None (Preliminary result)   Collection Time: 12/21/18  9:30 AM   Specimen: BLOOD  Result Value Ref Range Status   Specimen Description   Final    BLOOD RIGHT ANTECUBITAL Performed at [place] [address], [place], [place] [address]    Special Requests   Final    BOTTLES DRAWN AEROBIC AND ANAEROBIC Blood Culture adequate volume Performed at [place] [address], [place], [place] [address]    Culture   Final    NO GROWTH 2 DAYS Performed at [place] [address], [place], [place] [address]    Report Status PENDING  Incomplete  SARS Coronavirus 2 by RT PCR (hospital order, performed in [facility] hospital lab) Nasopharyngeal Nasopharyngeal Swab     Status: None   Collection Time: 12/21/18  9:46 AM   Specimen: Nasopharyngeal Swab  Result Value Ref Range Status   SARS Coronavirus 2 NEGATIVE NEGATIVE Final    Comment: (NOTE) If result is NEGATIVE SARS-CoV-2  target nucleic acids are NOT DETECTED. The SARS-CoV-2 RNA is generally detectable in upper and lower  respiratory specimens during the acute phase of infection. The lowest  concentration of SARS-CoV-2 viral copies  this assay can detect is 250  copies / mL. A negative result does not preclude SARS-CoV-2 infection  and should not be used as the sole basis for treatment or other  patient management decisions.  A negative result may occur with  improper specimen collection / handling, submission of specimen other  than nasopharyngeal swab, presence of viral mutation(s) within the  areas targeted by this assay, and inadequate number of viral copies  (<250 copies / mL). A negative result must be combined with clinical  observations, patient history, and epidemiological information. If result is POSITIVE SARS-CoV-2 target nucleic acids are DETECTED. The SARS-CoV-2 RNA is generally detectable in upper and lower  respiratory specimens dur ing the acute phase of infection.  Positive  results are indicative of active infection with SARS-CoV-2.  Clinical  correlation with patient history and other diagnostic information is  necessary to determine patient infection status.  Positive results do  not rule out bacterial infection or co-infection with other viruses. If result is PRESUMPTIVE POSTIVE SARS-CoV-2 nucleic acids MAY BE PRESENT.   A presumptive positive result was obtained on the submitted specimen  and confirmed on repeat testing.  While 2019 novel coronavirus  (SARS-CoV-2) nucleic acids may be present in the submitted sample  additional confirmatory testing may be necessary for epidemiological  and / or clinical management purposes  to differentiate between  SARS-CoV-2 and other Sarbecovirus currently known to infect humans.  If clinically indicated additional testing with an alternate test  methodology [phone]) is advised. The SARS-CoV-2 RNA is generally  detectable in upper and lower  respiratory sp ecimens during the acute  phase of infection. The expected result is Negative. Fact Sheet for Patients:  StrictlyIdeas.no Fact Sheet for Healthcare Providers: BankingDealers.co.za This test is not yet approved or cleared by the [place] FDA and has been authorized for detection and/or diagnosis of SARS-CoV-2 by FDA under an Emergency Use Authorization (EUA).  This EUA will remain in effect (meaning this test can be used) for the duration of the COVID-19 declaration under Section 564(b)(1) of the Act, 21 U.S.C. section 360bbb-3(b)(1), unless the authorization is terminated or revoked sooner. Performed at [facility], [place] [address] [place], [place] [address]   Blood Culture (routine x 2)     Status: None (Preliminary result)   Collection Time: 12/21/18 10:00 AM   Specimen: BLOOD  Result Value Ref Range Status   Specimen Description   Final    BLOOD LEFT WRIST Performed at [place] [address] [place], [place] [address]    Special Requests   Final    BOTTLES DRAWN AEROBIC AND ANAEROBIC Blood Culture adequate volume Performed at [place] [address], [place], [place] [address]    Culture   Final    NO GROWTH 2 DAYS Performed at [place] [address] [place], [place] [address]    Report Status PENDING  Incomplete  MRSA PCR Screening     Status: None   Collection Time: 12/21/18  5:00 PM   Specimen: Nasopharyngeal Swab  Result Value Ref Range Status   MRSA by PCR NEGATIVE NEGATIVE Final    Comment:        The GeneXpert MRSA Assay (FDA approved for NASAL specimens only), is one component of a comprehensive MRSA colonization surveillance program. It is not intended to diagnose MRSA infection nor to guide or monitor treatment for MRSA infections. Performed at [facility], [place]  [address],  [place], [place] [address]   SARS CORONAVIRUS 2 (TAT 6-24 HRS) Nasopharyngeal Nasopharyngeal Swab     Status: None   Collection Time: 12/21/18  5:46 PM   Specimen: Nasopharyngeal Swab  Result Value Ref Range Status   SARS Coronavirus 2 NEGATIVE NEGATIVE Final    Comment: (NOTE) SARS-CoV-2 target nucleic acids are NOT DETECTED. The SARS-CoV-2 RNA is generally detectable in upper and lower respiratory specimens during the acute phase of infection. Negative results do not preclude SARS-CoV-2 infection, do not rule out co-infections with other pathogens, and should not be used as the sole basis for treatment or other patient management decisions. Negative results must be combined with clinical observations, patient history, and epidemiological information. The expected result is Negative. Fact Sheet for Patients: SugarRoll.be Fact Sheet for Healthcare Providers: https://www.woods-mathews.com/ This test is not yet approved or cleared by the [place] FDA and  has been authorized for detection and/or diagnosis of SARS-CoV-2 by FDA under an Emergency Use Authorization (EUA). This EUA will remain  in effect (meaning this test can be used) for the duration of the COVID-19 declaration under Section 56 4(b)(1) of the Act, 21 U.S.C. section 360bbb-3(b)(1), unless the authorization is terminated or revoked sooner. Performed at [facility], [place] [address] [place], [place] [address]          Radiology Studies: Ct Abdomen Pelvis Wo Contrast  Result Date: 12/21/2018 CLINICAL DATA:  Nausea vomiting since yesterday, confusion. Limited evaluation due to patient condition. EXAM: CT ABDOMEN AND PELVIS WITHOUT CONTRAST TECHNIQUE: Multidetector CT imaging of the abdomen and pelvis was performed following the standard protocol without IV contrast. COMPARISON:  11/08/2016 FINDINGS: Lower chest: Superimposed airspace disease is present upon a background of  interstitial lung disease. To a lesser extent similar changes are seen in the medial left lung base. No signs of pleural effusion. Extensive coronary artery calcifications and evidence of aortic atherosclerosis with stable dilation of the descending thoracic aorta to 3.6 cm. Heart size is enlarged without pericardial effusion. Central pulmonary vasculature is engorged at 3.8 cm. Hepatobiliary: Lobular hepatic contours. No signs of focal lesion. Post cholecystectomy. No signs of biliary ductal dilation. Pancreas: Unremarkable. No pancreatic ductal dilatation or surrounding inflammatory changes. Spleen: Normal in size without focal abnormality. Adrenals/Urinary Tract: Adrenal glands are normal. Post right nephrectomy. Renal cortical scarring noted on the left. No signs of left-sided hydronephrosis or perinephric fluid. Stomach/Bowel: No signs of bowel obstruction. Large volume stool is however present in the rectum but with similar appearance to prior exam. Colonic diverticulosis. Large duodenal diverticulum unchanged. Vascular/Lymphatic: Marked vascular disease with moderate to marked dilation of the infrarenal abdominal aorta with fusiform morphology measuring 4.4 x 4.3 cm compared to 3.8 x 3.8 cm in 2018. No periaortic stranding or hematoma. No signs of adenopathy in the retroperitoneum or in the upper abdomen. No signs of pelvic lymphadenopathy. Reproductive: Foley catheter in situ, circumferential thickening of the urinary bladder, nonspecific finding. Other: No signs of free air. No ascites. Musculoskeletal: Extensive spinal degenerative changes are similar to the previous exam. Skis be no acute bone finding or destructive bone process. IMPRESSION: Pneumonia superimposed on chronic changes of interstitial lung disease. Aspiration is also considered given history of vomiting. Marked vascular disease with stable dilation of the descending thoracic aorta to 3.6 cm. Infrarenal abdominal aortic aneurysm measuring 4.4  x 4.3 cm compared to 3.8 x 3.8 cm in 2018. Recommend followup by ultrasound in 1 year. This recommendation follows ACR consensus guidelines: White  Paper of the ACR Incidental Findings Committee II on Vascular Findings. J Am Coll Radiol 2013; 10:789-794. Aortic aneurysm NOS (ICD10-I71.9) Coronary artery disease and signs of pulmonary arterial hypertension. Aortic Atherosclerosis (ICD10-I70.0). Electronically Signed   By: Zetta Bills M.D.   On: 12/21/2018 15:32   Dg Chest Port 1 View  Result Date: 12/21/2018 CLINICAL DATA:  Cough and shortness of breath. Negative COVID-19 test. EXAM: PORTABLE CHEST 1 VIEW COMPARISON:  PA and lateral chest 06/10/2015, 11/08/2016 and 11/10/2016. FINDINGS: There is right upper lobe and bibasilar airspace disease. No pneumothorax. There are likely small bilateral pleural effusions. Cardiomegaly and atherosclerosis are identified. No acute or focal bony abnormality. IMPRESSION: Right upper lobe and bibasilar airspace disease most worrisome for pneumonia. Cardiomegaly. Atherosclerosis. Electronically Signed   By: Inge Rise M.D.   On: 12/21/2018 11:38   Dg Abd Portable 1 View  Result Date: 12/21/2018 CLINICAL DATA:  Cough, shortness of breath and renal failure. EXAM: PORTABLE ABDOMEN - 1 VIEW COMPARISON:  CT abdomen and pelvis 11/08/2016. FINDINGS: The bowel gas pattern is normal. Moderately large stool ball in the rectosigmoid colon noted. No radio-opaque calculi or other significant radiographic abnormality are seen. The patient is status post cholecystectomy. Multilevel lumbar spondylosis is seen. IMPRESSION: No acute finding. Moderately large stool ball in the rectosigmoid colon. Electronically Signed   By: Inge Rise M.D.   On: 12/21/2018 11:39        Scheduled Meds: . amLODipine  5 mg Oral Daily  . aspirin  81 mg Oral Daily  . Chlorhexidine Gluconate Cloth  6 each Topical Daily  . heparin  5,000 Units Subcutaneous Q8H  . isosorbide mononitrate  60  mg Oral BID  . levothyroxine  75 mcg Oral Daily  . mouth rinse  15 mL Mouth Rinse BID  . metoprolol succinate  25 mg Oral Daily  . pravastatin  20 mg Oral q1800  . sertraline  50 mg Oral Daily   Continuous Infusions: . ceFEPime (MAXIPIME) IV Stopped (12/22/18 1417)  . dextrose Stopped (12/23/18 0050)  . vancomycin       LOS: 2 days    Time spent: 35 mins.More than 50% of that time was spent in counseling and/or coordination of care.      Shelly Coss, MD [facility] Pager [phone]  If 7PM-7AM, please contact night-coverage [email or web address] Password [email or web address] 12/23/2018, 11:03 AM

## 2018-12-24 ENCOUNTER — Inpatient Hospital Stay (HOSPITAL_COMMUNITY): Payer: Medicare Other

## 2018-12-24 LAB — CBC WITH DIFFERENTIAL/PLATELET
Abs Immature Granulocytes: 0.09 10*3/uL — ABNORMAL HIGH (ref 0.00–0.07)
Basophils Absolute: 0.1 10*3/uL (ref 0.0–0.1)
Basophils Relative: 0 %
Eosinophils Absolute: 0.1 10*3/uL (ref 0.0–0.5)
Eosinophils Relative: 1 %
HCT: 34.4 % — ABNORMAL LOW (ref 39.0–52.0)
Hemoglobin: 10.5 g/dL — ABNORMAL LOW (ref 13.0–17.0)
Immature Granulocytes: 1 %
Lymphocytes Relative: 9 %
Lymphs Abs: 1.1 10*3/uL (ref 0.7–4.0)
MCH: 29.7 pg (ref 26.0–34.0)
MCHC: 30.5 g/dL (ref 30.0–36.0)
MCV: 97.2 fL (ref 80.0–100.0)
Monocytes Absolute: 0.7 10*3/uL (ref 0.1–1.0)
Monocytes Relative: 6 %
Neutro Abs: 10.3 10*3/uL — ABNORMAL HIGH (ref 1.7–7.7)
Neutrophils Relative %: 83 %
Platelets: 195 10*3/uL (ref 150–400)
RBC: 3.54 MIL/uL — ABNORMAL LOW (ref 4.22–5.81)
RDW: 14.9 % (ref 11.5–15.5)
WBC: 12.3 10*3/uL — ABNORMAL HIGH (ref 4.0–10.5)
nRBC: 0 % (ref 0.0–0.2)

## 2018-12-24 LAB — GLUCOSE, CAPILLARY
Glucose-Capillary: 103 mg/dL — ABNORMAL HIGH (ref 70–99)
Glucose-Capillary: 97 mg/dL (ref 70–99)
Glucose-Capillary: 109 mg/dL — ABNORMAL HIGH (ref 70–99)
Glucose-Capillary: 111 mg/dL — ABNORMAL HIGH (ref 70–99)
Glucose-Capillary: 107 mg/dL — ABNORMAL HIGH (ref 70–99)

## 2018-12-24 LAB — BASIC METABOLIC PANEL
Anion gap: 10 (ref 5–15)
BUN: 55 mg/dL — ABNORMAL HIGH (ref 8–23)
CO2: 20 mmol/L — ABNORMAL LOW (ref 22–32)
Calcium: 8.2 mg/dL — ABNORMAL LOW (ref 8.9–10.3)
Chloride: 113 mmol/L — ABNORMAL HIGH (ref 98–111)
Creatinine, Ser: 2.35 mg/dL — ABNORMAL HIGH (ref 0.61–1.24)
GFR calc Af Amer: 26 mL/min — ABNORMAL LOW (ref >60)
GFR calc non Af Amer: 23 mL/min — ABNORMAL LOW (ref >60)
Glucose, Bld: 111 mg/dL — ABNORMAL HIGH (ref 70–99)
Potassium: 3.9 mmol/L (ref 3.5–5.1)
Sodium: 143 mmol/L (ref 135–145)

## 2018-12-24 MED ORDER — RESOURCE THICKENUP CLEAR PO POWD
ORAL | Status: DC | PRN
  Filled 2018-12-24: qty 125

## 2018-12-24 MED ORDER — QUETIAPINE FUMARATE 50 MG PO TABS
25.0000 mg | ORAL_TABLET | Freq: Every day | ORAL | Status: DC
  Administered 2018-12-24 – 2018-12-27 (×3): 25 mg via ORAL
  Filled 2018-12-24 (×3): qty 1

## 2018-12-24 MED ORDER — LORAZEPAM 2 MG/ML IJ SOLN
0.5000 mg | INTRAMUSCULAR | Status: DC | PRN
  Administered 2018-12-24: 23:00:00 0.5 mg via INTRAVENOUS
  Filled 2018-12-24: qty 1

## 2018-12-24 MED ORDER — LORAZEPAM 0.5 MG PO TABS
0.5000 mg | ORAL_TABLET | Freq: Four times a day (QID) | ORAL | Status: DC | PRN
  Administered 2018-12-24: 16:00:00 0.5 mg via ORAL
  Filled 2018-12-24: qty 1

## 2018-12-24 MED ORDER — MELATONIN 3 MG PO TABS
3.0000 mg | ORAL_TABLET | Freq: Every evening | ORAL | Status: DC | PRN
  Administered 2018-12-24 – 2018-12-27 (×2): 3 mg via ORAL
  Filled 2018-12-24 (×3): qty 1

## 2018-12-24 MED ORDER — FOOD THICKENER (SIMPLYTHICK)
2.0000 | ORAL | Status: DC | PRN
  Filled 2018-12-24: qty 2

## 2018-12-24 MED ORDER — HALOPERIDOL LACTATE 5 MG/ML IJ SOLN
2.0000 mg | Freq: Four times a day (QID) | INTRAMUSCULAR | Status: DC | PRN
  Administered 2018-12-24 – 2018-12-29 (×10): 2 mg via INTRAVENOUS
  Filled 2018-12-24 (×10): qty 1

## 2018-12-24 NOTE — Progress Notes (Signed)
PROGRESS NOTE    Leonard Shepard  [id number] DOB: Sep 09, 1922 DOA: 12/21/2018 PCP: Hennie Duos, MD   Brief Narrative:  Leonard Shepard is a 85 y.o. male with medical history significant for CAD, pulmonary fibrosis on home O2 at 3L/min  chronic PE no longer on coumadin due to severe GI bleed in 2018, HTN, HLD, solitary kidney s/p R nephrectomy, CKD stage IV who presents to the ED complaining of chills, projectile vomiting, worsening shortness of breath, with sats in the 62s.  Patient has history of aspiration pneumonia multiple times in the past.  He is usually independent, alert and oriented at baseline, ambulates of the with help of walker and lives with his son.  He son tested positive for Covid-19 about 10 days ago but his son says that he isolated himself pretty well.  Covid-19 test negative twice.  CT chest showed right-sided and bibasilar pneumonia on the background of his  interstitial lung disease.  Patient started on  IV antibiotics for aspiration pneumonia. His mental status and respiratory status have improved this morning.   Assessment & Plan:   Principal Problem:   Sepsis ([place]) Active Problems:   Chronic pulmonary embolism (HCC)   Aspiration pneumonia (HCC)   Acquired hypothyroidism   History of adrenal insufficiency   Postinflammatory pulmonary fibrosis (HCC)   H/O unilateral nephrectomy   Acute renal failure superimposed on stage 4 chronic kidney disease (HCC)   Acute on chronic hypoxic respiratory failure: Secondary to pneumonia.  Has history of recurrent aspiration pneumonia in the past.  Has history of pulmonary fibrosis and he is on 3 L of oxygen at home.  Currently requiring high flow oxygen.We will try to wean.  His respiratory status has improved.  He was not in any kind of respiratory distress this morning.  Aspiration pneumonia: CT chest showed bibasilar pneumonia.  Chest x-ray showed right-sided pneumonia.  Continue broad-spectrum antibiotics for now. He has  history of aspiration pneumonia in the past and was admitted.  He has history of esophageal dysmotility as per son.  He was nauseated and vomiting at home. Currently afebrile.  Since mental status has significantly improved today, he has been started on dysphagia 1 diet.  Altered mental status: Present on admission.Secondary to altered metabolic encephalopathy due to pneumonia, hypoxia.  Much more awake, alert.  Oriented to place.  Tells current month.  On his baseline, he is alert and oriented as per son.  Suspicion for Covid-19: Test negative twice.  Son was diagnosed with Covid about 10 days ago.  Patient lives with son.  As per son, he had isolated himself.  Continue airborne precaution as per ID.  Elevated troponin: Has history of coronary disease.  Denies any chest pain presentation.  Troponin peaked to 1900s.  EKG did not show any acute ST changes.  Recent echocardiogram showed ejection fraction of 60-65%, no wall motion abnormality.  Cardiology was consulted, suspected demand ischemia, no further work-up recommended.  Hypertensive urgency: Severely hypertensive on presentation.  Blood pressure much better.  Continue current medicines  Elevated D-dimer: Could be multifactorial.  History of PE and was on Coumadin in the past but he stopped due to GI bleed.    History of adrenal insufficiency: On Florinef and steroid at home.resumed  CKD stage IV/history of right nephrectomy: Currently kidney function is at baseline.  Constipation:Had bowel movements. Continue  bowel regimen.  Deconditioning/debility/advanced age, multiple comorbidities: Have requested for palliative care consultation.  If his respiratory status does not improve or if  he declines further, he is a candidate for hospice.  Requested PT/OT evaluation.         DVT prophylaxis: Heparin Kraemer Code Status: DNR Family Communication: Discussed with son on phone on 12/23/18 Disposition Plan: Hopeful for discharge back to home  after improvement in the respiratory status.   Consultants: Cardiology  Procedures: None  Antimicrobials:  Anti-infectives (From admission, onward)   Start     Dose/Rate Route Frequency Ordered Stop   12/23/18 1600  vancomycin (VANCOCIN) 1,500 mg in sodium chloride 0.9 % 500 mL IVPB     1,500 mg 250 mL/hr over 120 Minutes Intravenous Every 48 hours 12/21/18 1318     12/21/18 1400  ceFEPIme (MAXIPIME) 2 g in sodium chloride 0.9 % 100 mL IVPB     2 g 200 mL/hr over 30 Minutes Intravenous Every 24 hours 12/21/18 1316     12/21/18 1300  ceFEPIme (MAXIPIME) 2 g in sodium chloride 0.9 % 100 mL IVPB  Status:  Discontinued     2 g 200 mL/hr over 30 Minutes Intravenous Every 12 hours 12/21/18 1246 12/21/18 1316   12/21/18 1300  vancomycin (VANCOCIN) 2,000 mg in sodium chloride 0.9 % 500 mL IVPB     2,000 mg 250 mL/hr over 120 Minutes Intravenous  Once 12/21/18 1250 12/21/18 1639   12/21/18 0945  cefTRIAXone (ROCEPHIN) 2 g in sodium chloride 0.9 % 100 mL IVPB  Status:  Discontinued     2 g 200 mL/hr over 30 Minutes Intravenous Every 24 hours 12/21/18 0937 12/21/18 1247   12/21/18 0945  azithromycin (ZITHROMAX) 500 mg in sodium chloride 0.9 % 250 mL IVPB  Status:  Discontinued     500 mg 250 mL/hr over 60 Minutes Intravenous Every 24 hours 12/21/18 0937 12/21/18 1247      Subjective:  Patient seen and examined the bedside this morning.  Appears very comfortable today.  Not in respiratory distress but he was on high flow oxygen.  I have requested RN to wean the oxygen.  Mental status is much better today.  He communicates.  He is alert, awake and oriented to place, person.  Tells current month.  Objective: Vitals:   12/24/18 0609 12/24/18 0800 12/24/18 1000 12/24/18 1100  BP: (!) 172/76 124/88 132/67   Pulse: 66 80 63 63  Resp: 20 20 (!) 22 (!) 28  Temp:  97.7 F (36.5 C)  (!) 97.3 F (36.3 C)  TempSrc:  Oral  Oral  SpO2: 99% 92% 100% 95%  Weight:      Height:         Intake/Output Summary (Last 24 hours) at 12/24/2018 1122 Last data filed at 12/24/2018 1000 Gross per 24 hour  Intake 1356.69 ml  Output 1075 ml  Net 281.69 ml   Filed Weights   12/22/18 0500 12/23/18 0500 12/24/18 0428  Weight: 92.7 kg 91.6 kg 89.1 kg    Examination:  General exam: not in distress, elderly male HEENT:PERRL,Oral mucosa dry, Ear/Nose normal on gross exam Respiratory system: Bilateral decreased air entry,basal crackles Cardiovascular system: S1 & S2 heard, RRR. No JVD, murmurs, rubs, gallops or clicks. No pedal edema. Gastrointestinal system: Abdomen is nondistended, soft and nontender. No organomegaly or masses felt. Normal bowel sounds heard. Central nervous system: Alert, awake, oriented x2 extremities: No edema, no clubbing ,no cyanosis, distal peripheral pulses palpable. Skin: No rashes, lesions or ulcers,no icterus ,no pallor    Data Reviewed: I have personally reviewed following labs and imaging studies  CBC: Recent  Labs  Lab 12/21/18 0930 12/22/18 0228 12/23/18 0552 12/24/18 0203  WBC 13.6* 18.1* 14.7* 12.3*  NEUTROABS 11.1*  --  12.3* 10.3*  HGB 12.2* 12.3* 11.5* 10.5*  HCT 38.5* 39.4 37.0* 34.4*  MCV 94.8 96.1 97.1 97.2  PLT 222 192 175 0000000   Basic Metabolic Panel: Recent Labs  Lab 12/21/18 0930 12/22/18 0228 12/23/18 0552 12/24/18 0203  NA 139 142 144 143  K 3.4* 4.7 4.4 3.9  CL 104 109 109 113*  CO2 24 21* 21* 20*  GLUCOSE 108* 106* 109* 111*  BUN 34* 37* 52* 55*  CREATININE 2.16* 2.45* 2.57* 2.35*  CALCIUM 8.6* 8.3* 8.5* 8.2*   GFR: Estimated Creatinine Clearance: 21.1 mL/min (A) (by C-G formula based on SCr of 2.35 mg/dL (H)). Liver Function Tests: Recent Labs  Lab 12/21/18 0930 12/22/18 0228  AST 24 54*  ALT 13 17  ALKPHOS 81 59  BILITOT 0.8 0.8  PROT 6.7 6.4*  ALBUMIN 3.5 3.1*   No results for input(s): LIPASE, AMYLASE in the last 168 hours. No results for input(s): AMMONIA in the last 168 hours. Coagulation  Profile: No results for input(s): INR, PROTIME in the last 168 hours. Cardiac Enzymes: No results for input(s): CKTOTAL, CKMB, CKMBINDEX, TROPONINI in the last 168 hours. BNP (last 3 results) No results for input(s): PROBNP in the last 8760 hours. HbA1C: No results for input(s): HGBA1C in the last 72 hours. CBG: Recent Labs  Lab 12/23/18 1939 12/23/18 2340 12/24/18 0426 12/24/18 0815 12/24/18 1101  GLUCAP 89 104* 103* 97 109*   Lipid Profile: No results for input(s): CHOL, HDL, LDLCALC, TRIG, CHOLHDL, LDLDIRECT in the last 72 hours. Thyroid Function Tests: No results for input(s): TSH, T4TOTAL, FREET4, T3FREE, THYROIDAB in the last 72 hours. Anemia Panel: No results for input(s): VITAMINB12, FOLATE, FERRITIN, TIBC, IRON, RETICCTPCT in the last 72 hours. Sepsis Labs: Recent Labs  Lab 12/21/18 0930 12/21/18 1114 12/21/18 1855 12/21/18 2107 12/23/18 0553  PROCALCITON <0.10  --   --   --   --   LATICACIDVEN 2.6* 3.9* 2.5* 3.1* 1.5    Recent Results (from the past 240 hour(s))  Urine culture     Status: None   Collection Time: 12/21/18  9:16 AM   Specimen: Urine, Catheterized  Result Value Ref Range Status   Specimen Description   Final    URINE, CATHETERIZED Performed at [facility], [place] [address] [place], [place] [address]    Special Requests   Final    NONE Performed at [facility], [place] [address] [place], [place] [address]    Culture   Final    NO GROWTH Performed at [facility], [place] [address], [place], [place] [address]    Report Status 12/22/2018 FINAL  Final  Blood Culture (routine x 2)     Status: None (Preliminary result)   Collection Time: 12/21/18  9:30 AM   Specimen: BLOOD  Result Value Ref Range Status   Specimen Description   Final    BLOOD RIGHT ANTECUBITAL Performed at [facility] [address] [place], [place] [address]    Special Requests   Final    BOTTLES DRAWN  AEROBIC AND ANAEROBIC Blood Culture adequate volume Performed at [facility] [address], [place], [place] [address]    Culture   Final    NO GROWTH 3 DAYS Performed at [facility], [place] [address], [place], [place] [address]  Report Status PENDING  Incomplete  SARS Coronavirus 2 by RT PCR (hospital order, performed in [facility] hospital lab) Nasopharyngeal Nasopharyngeal Swab     Status: None   Collection Time: 12/21/18  9:46 AM   Specimen: Nasopharyngeal Swab  Result Value Ref Range Status   SARS Coronavirus 2 NEGATIVE NEGATIVE Final    Comment: (NOTE) If result is NEGATIVE SARS-CoV-2 target nucleic acids are NOT DETECTED. The SARS-CoV-2 RNA is generally detectable in upper and lower  respiratory specimens during the acute phase of infection. The lowest  concentration of SARS-CoV-2 viral copies this assay can detect is 250  copies / mL. A negative result does not preclude SARS-CoV-2 infection  and should not be used as the sole basis for treatment or other  patient management decisions.  A negative result may occur with  improper specimen collection / handling, submission of specimen other  than nasopharyngeal swab, presence of viral mutation(s) within the  areas targeted by this assay, and inadequate number of viral copies  (<250 copies / mL). A negative result must be combined with clinical  observations, patient history, and epidemiological information. If result is POSITIVE SARS-CoV-2 target nucleic acids are DETECTED. The SARS-CoV-2 RNA is generally detectable in upper and lower  respiratory specimens dur ing the acute phase of infection.  Positive  results are indicative of active infection with SARS-CoV-2.  Clinical  correlation with patient history and other diagnostic information is  necessary to determine patient infection status.  Positive results do  not rule out bacterial infection or co-infection with other viruses. If result is  PRESUMPTIVE POSTIVE SARS-CoV-2 nucleic acids MAY BE PRESENT.   A presumptive positive result was obtained on the submitted specimen  and confirmed on repeat testing.  While 2019 novel coronavirus  (SARS-CoV-2) nucleic acids may be present in the submitted sample  additional confirmatory testing may be necessary for epidemiological  and / or clinical management purposes  to differentiate between  SARS-CoV-2 and other Sarbecovirus currently known to infect humans.  If clinically indicated additional testing with an alternate test  methodology [id number]) is advised. The SARS-CoV-2 RNA is generally  detectable in upper and lower respiratory sp ecimens during the acute  phase of infection. The expected result is Negative. Fact Sheet for Patients:  StrictlyIdeas.no Fact Sheet for Healthcare Providers: BankingDealers.co.za This test is not yet approved or cleared by the [place] FDA and has been authorized for detection and/or diagnosis of SARS-CoV-2 by FDA under an Emergency Use Authorization (EUA).  This EUA will remain in effect (meaning this test can be used) for the duration of the COVID-19 declaration under Section 564(b)(1) of the Act, 21 U.S.C. section 360bbb-3(b)(1), unless the authorization is terminated or revoked sooner. Performed at [facility], [place] [address], [place], [place] [address]   Blood Culture (routine x 2)     Status: None (Preliminary result)   Collection Time: 12/21/18 10:00 AM   Specimen: BLOOD  Result Value Ref Range Status   Specimen Description   Final    BLOOD LEFT WRIST Performed at [facility] [address], [place], [place] [address]    Special Requests   Final    BOTTLES DRAWN AEROBIC AND ANAEROBIC Blood Culture adequate volume Performed at [facility] [address] [place], [place] [address]    Culture   Final    NO GROWTH 3 DAYS  Performed at [facility], [place] [address], [place], [place] [address]  Report Status PENDING  Incomplete  MRSA PCR Screening     Status: None   Collection Time: 12/21/18  5:00 PM   Specimen: Nasopharyngeal Swab  Result Value Ref Range Status   MRSA by PCR NEGATIVE NEGATIVE Final    Comment:        The GeneXpert MRSA Assay (FDA approved for NASAL specimens only), is one component of a comprehensive MRSA colonization surveillance program. It is not intended to diagnose MRSA infection nor to guide or monitor treatment for MRSA infections. Performed at [facility], [place] [address] [place], [place] [address]   SARS CORONAVIRUS 2 (TAT 6-24 HRS) Nasopharyngeal Nasopharyngeal Swab     Status: None   Collection Time: 12/21/18  5:46 PM   Specimen: Nasopharyngeal Swab  Result Value Ref Range Status   SARS Coronavirus 2 NEGATIVE NEGATIVE Final    Comment: (NOTE) SARS-CoV-2 target nucleic acids are NOT DETECTED. The SARS-CoV-2 RNA is generally detectable in upper and lower respiratory specimens during the acute phase of infection. Negative results do not preclude SARS-CoV-2 infection, do not rule out co-infections with other pathogens, and should not be used as the sole basis for treatment or other patient management decisions. Negative results must be combined with clinical observations, patient history, and epidemiological information. The expected result is Negative. Fact Sheet for Patients: SugarRoll.be Fact Sheet for Healthcare Providers: https://www.woods-mathews.com/ This test is not yet approved or cleared by the [place] FDA and  has been authorized for detection and/or diagnosis of SARS-CoV-2 by FDA under an Emergency Use Authorization (EUA). This EUA will remain  in effect (meaning this test can be used) for the duration of the COVID-19 declaration under Section 56 4(b)(1) of the Act, 21 U.S.C.  section 360bbb-3(b)(1), unless the authorization is terminated or revoked sooner. Performed at [facility], [place] [address], [place],  [address]          Radiology Studies: No results found.      Scheduled Meds: . amLODipine  10 mg Oral Daily  . aspirin  81 mg Oral Daily  . Chlorhexidine Gluconate Cloth  6 each Topical Daily  . fludrocortisone  0.1 mg Oral Daily  . heparin  5,000 Units Subcutaneous Q8H  . hydrALAZINE  25 mg Oral Q8H  . hydrocortisone  10 mg Oral Daily  . hydrocortisone  5 mg Oral QHS  . isosorbide mononitrate  60 mg Oral BID  . levothyroxine  75 mcg Oral Daily  . mouth rinse  15 mL Mouth Rinse BID  . metoprolol succinate  25 mg Oral Daily  . pravastatin  20 mg Oral q1800  . sertraline  50 mg Oral Daily   Continuous Infusions: . ceFEPime (MAXIPIME) IV Stopped (12/23/18 1643)  . dextrose Stopped (12/24/18 0923)  . vancomycin Stopped (12/23/18 1846)     LOS: 3 days    Time spent: 35 mins.More than 50% of that time was spent in counseling and/or coordination of care.      Shelly Coss, MD [facility] Pager [phone]  If 7PM-7AM, please contact night-coverage [email or web address] Password [email or web address] 12/24/2018, 11:22 AM

## 2018-12-24 NOTE — Progress Notes (Signed)
Silver band ring sent home with daughter today as patient's hands are swollen. Daughter also has hearing aids with her.

## 2018-12-24 NOTE — Progress Notes (Signed)
  Speech Language Pathology Treatment: Dysphagia  Patient Details Name: Leonard Shepard MRN: [id number] DOB: [birth year] Today's Date: 12/24/2018 Time: [id number] SLP Time Calculation (min) (ACUTE ONLY): 35 min  Assessment / Plan / Recommendation Clinical Impression  Pt seen at bedside to assess readiness for po intake in setting of aspiration PNA, advanced age, and pt's reported significant weakness. Pt awake and alert, able to follow directions during this session. Pt accepted trials of ice chips, thin liquid, nectar thick liquid, honey thick liquid, and puree textures. Timely oral prep noted, with multiple swallows for each bolus. This raises concern for post-swallow residue. Cough response noted following trials of thin and nectar thick liquid, raising concern for airway compromise. No cough response was noted following trials of honey thick liquid or puree. Solid textures not given at this time, due to risk of aspiration with fatigue. Given multiple comorbidities, pt is at increased risk of aspiration. Recommend beginning po intake with conservative diet of puree and honey thick liquids. As pt strength and endurance improve, diet may be liberalized as pt tolerates. Recommend palliative care consult to facilitate establishment of appropriate goals of care. SLP will follow to assess diet tolerance and provide education. No family present at this time. Safe swallow precautions given to RN to be posted at [facility] RN and MD informed of results and recommendations.    HPI HPI: 85 y.o. male with medical history significant for CAD, pulmonary fibrosis on home O2 at 3L/min, chronic PE, HTN, HLD, solitary kidney s/p R nephrectomy, CKD stage IV who presents to the ED complaining of chills, projectile vomiting, worsening shortness of breath, with sats in the 60s.  Patient has history of aspiration pneumonia multiple times in the past.  MBS in 2018 revealed backflow of POs from esophagus into pyriforms; he was at  heightened risk for post-prandial aspiration. He is usually independent, alert and oriented at baseline, ambulates with help of walker and lives with his son, who recently tested positive for COVID. CT chest showed right-sided and bibasilar pneumonia on the background of his  interstitial lung disease. Dx acute on chronic resp failure, aspiration pna, sepsis.       SLP Plan  Continue with current plan of care       Recommendations  Diet recommendations: Dysphagia 1 (puree);Honey-thick liquid Liquids provided via: Straw;Cup Medication Administration: Whole meds with puree Supervision: Staff to assist with self feeding;Full supervision/cueing for compensatory strategies Compensations: Minimize environmental distractions;Slow rate;Small sips/bites Postural Changes and/or Swallow Maneuvers: Seated upright 90 degrees;Upright 30-60 min after meal                Oral Care Recommendations: Oral care QID Follow up Recommendations: Other (comment)(TBD) SLP Visit Diagnosis: Dysphagia, unspecified (R13.10) Plan: Continue with current plan of care       Leonard Shepard. Leonard Shepard, [facility], [place] Speech Language Pathologist Office: [phone] Pager: [phone]  Shonna Chock 12/24/2018, 9:49 AM

## 2018-12-24 NOTE — Progress Notes (Signed)
Pharmacy Antibiotic Note  Leonard Shepard is a 85 y.o. male admitted on 12/21/2018 with sepsis.  Pharmacy has been consulted for vancomycin and cefepime dosing. CXR: RUL worrisome for PNA  12/24/2018  Scr 2.35, CrCl ~ 21.32mls/min WBC 12.3 Afebrile    Plan: Continue Cefepime 2 gm IV q24 for CrCl < 30 ml/min Continue Vancomycin 1500 mg IV Q 48 hrs.  F/u renal fxn, WBC, temp, culture data Vancomycin levels as needed   dHeight: 6' (182.9 cm) Weight: 196 lb 6.9 oz (89.1 kg) IBW/kg (Calculated) : 77.6  Temp (24hrs), Avg:97.6 F (36.4 C), Min:97.3 F (36.3 C), Max:98 F (36.7 C)  Recent Labs  Lab 12/21/18 0930 12/21/18 1114 12/21/18 1855 12/21/18 2107 12/22/18 0228 12/23/18 0552 12/23/18 0553 12/24/18 0203  WBC 13.6*  --   --   --  18.1* 14.7*  --  12.3*  CREATININE 2.16*  --   --   --  2.45* 2.57*  --  2.35*  LATICACIDVEN 2.6* 3.9* 2.5* 3.1*  --   --  1.5  --     Estimated Creatinine Clearance: 21.1 mL/min (A) (by C-G formula based on SCr of 2.35 mg/dL (H)).    Allergies  Allergen Reactions  . Aclidinium Bromide Itching and Other (See Comments)    Throat irritation    Antimicrobials this admission: 10/20 CTX x 1 10/20 azith x 1 10/20 vanc>> 10/20 cefepime>>  Dose adjustments this admission:  Microbiology results: 10/20 BCx2: ngtd 10/20 Covid neg 10/20 Ucx: NGF 10/20 MRSA PCR: NEG  Thank you for allowing pharmacy to be a part of this patient's care.  Dolly Rias RPh 12/24/2018, 12:43 PM Pager [phone]

## 2018-12-24 NOTE — Progress Notes (Addendum)
RN and charge RN attempted to get patient up to chair with lift or two person assist. Patient increasingly more confused and impulsive. Climbing out of bed and pulling at all wires and cords. Bed made into chair position and patient continuing to try to exit bed. RN and NT repositioned and educated family. RN staying in room until safe to leave. MD made aware. Adhikari verbal ok for Air cabin crew but unsure if able due to precautions. Haldol and Seroquel ordered. Daughter bedside made aware of possible need for medication or restraints later in night shift and agrees for patient safety. Will continue to monitor.

## 2018-12-25 ENCOUNTER — Inpatient Hospital Stay (HOSPITAL_COMMUNITY): Payer: Medicare Other

## 2018-12-25 DIAGNOSIS — Z7189 Other specified counseling: Secondary | ICD-10-CM

## 2018-12-25 DIAGNOSIS — Z515 Encounter for palliative care: Secondary | ICD-10-CM

## 2018-12-25 LAB — CBC WITH DIFFERENTIAL/PLATELET
Abs Immature Granulocytes: 0.14 10*3/uL — ABNORMAL HIGH (ref 0.00–0.07)
Basophils Absolute: 0.1 10*3/uL (ref 0.0–0.1)
Basophils Relative: 1 %
Eosinophils Absolute: 0.2 10*3/uL (ref 0.0–0.5)
Eosinophils Relative: 2 %
HCT: 33.9 % — ABNORMAL LOW (ref 39.0–52.0)
Hemoglobin: 10.8 g/dL — ABNORMAL LOW (ref 13.0–17.0)
Immature Granulocytes: 1 %
Lymphocytes Relative: 14 %
Lymphs Abs: 1.4 10*3/uL (ref 0.7–4.0)
MCH: 29.9 pg (ref 26.0–34.0)
MCHC: 31.9 g/dL (ref 30.0–36.0)
MCV: 93.9 fL (ref 80.0–100.0)
Monocytes Absolute: 0.9 10*3/uL (ref 0.1–1.0)
Monocytes Relative: 10 %
Neutro Abs: 7 10*3/uL (ref 1.7–7.7)
Neutrophils Relative %: 72 %
Platelets: UNDETERMINED 10*3/uL (ref 150–400)
RBC: 3.61 MIL/uL — ABNORMAL LOW (ref 4.22–5.81)
RDW: 14.6 % (ref 11.5–15.5)
WBC: 9.7 10*3/uL (ref 4.0–10.5)
nRBC: 0 % (ref 0.0–0.2)

## 2018-12-25 LAB — GLUCOSE, CAPILLARY
Glucose-Capillary: 87 mg/dL (ref 70–99)
Glucose-Capillary: 90 mg/dL (ref 70–99)
Glucose-Capillary: 94 mg/dL (ref 70–99)
Glucose-Capillary: 95 mg/dL (ref 70–99)
Glucose-Capillary: 98 mg/dL (ref 70–99)

## 2018-12-25 LAB — BLOOD GAS, ARTERIAL
Acid-base deficit: 0.6 mmol/L (ref 0.0–2.0)
Bicarbonate: 22.6 mmol/L (ref 20.0–28.0)
Drawn by: 441261
O2 Saturation: 97.6 %
Patient temperature: 98.6
pCO2 arterial: 33.8 mmHg (ref 32.0–48.0)
pH, Arterial: 7.441 (ref 7.350–7.450)
pO2, Arterial: 99.7 mmHg (ref 83.0–108.0)

## 2018-12-25 LAB — BASIC METABOLIC PANEL
Anion gap: 10 (ref 5–15)
BUN: 47 mg/dL — ABNORMAL HIGH (ref 8–23)
CO2: 22 mmol/L (ref 22–32)
Calcium: 8.4 mg/dL — ABNORMAL LOW (ref 8.9–10.3)
Chloride: 111 mmol/L (ref 98–111)
Creatinine, Ser: 2.19 mg/dL — ABNORMAL HIGH (ref 0.61–1.24)
GFR calc Af Amer: 29 mL/min — ABNORMAL LOW (ref >60)
GFR calc non Af Amer: 25 mL/min — ABNORMAL LOW (ref >60)
Glucose, Bld: 100 mg/dL — ABNORMAL HIGH (ref 70–99)
Potassium: 4.7 mmol/L (ref 3.5–5.1)
Sodium: 143 mmol/L (ref 135–145)

## 2018-12-25 MED ORDER — HYDROMORPHONE HCL 1 MG/ML IJ SOLN: 0.5000 mg | INTRAMUSCULAR | Status: DC | PRN

## 2018-12-25 MED ORDER — MORPHINE SULFATE (PF) 2 MG/ML IV SOLN
1.0000 mg | Freq: Once | INTRAVENOUS | Status: AC
  Administered 2018-12-25: 1 mg via INTRAVENOUS
  Filled 2018-12-25: qty 1

## 2018-12-25 MED ORDER — FUROSEMIDE 10 MG/ML IJ SOLN
80.0000 mg | Freq: Once | INTRAMUSCULAR | Status: AC
  Administered 2018-12-25: 11:00:00 80 mg via INTRAVENOUS
  Filled 2018-12-25: qty 8

## 2018-12-25 MED ORDER — METOPROLOL SUCCINATE ER 25 MG PO TB24
50.0000 mg | ORAL_TABLET | Freq: Every day | ORAL | Status: DC
  Administered 2018-12-27 – 2018-12-28 (×2): 50 mg via ORAL
  Filled 2018-12-25 (×2): qty 2

## 2018-12-25 MED ORDER — ALBUTEROL SULFATE HFA 108 (90 BASE) MCG/ACT IN AERS
2.0000 | INHALATION_SPRAY | Freq: Four times a day (QID) | RESPIRATORY_TRACT | Status: DC | PRN
  Filled 2018-12-25: qty 6.7

## 2018-12-25 NOTE — Progress Notes (Signed)
PROGRESS NOTE    Leonard Shepard  [id number] DOB: May 09, 1922 DOA: 12/21/2018 PCP: Hennie Duos, MD   Brief Narrative:  Leonard Shepard is a 85 y.o. male with medical history significant for CAD, pulmonary fibrosis on home O2 at 3L/min  chronic PE no longer on coumadin due to severe GI bleed in 2018, HTN, HLD, solitary kidney s/p R nephrectomy, CKD stage IV who presents to the ED complaining of chills, projectile vomiting, worsening shortness of breath, with sats in the 45s.  Patient has history of aspiration pneumonia multiple times in the past.  He is usually independent, alert and oriented at baseline, ambulates of the with help of walker and lives with his son.  He son tested positive for Covid-19 about 10 days ago but his son says that he isolated himself pretty well.  Covid-19 test negative twice.  CT chest showed right-sided and bibasilar pneumonia on the background of his  interstitial lung disease.  Patient started on  IV antibiotics for aspiration pneumonia. Respiratory status has not improved significantly.  Palliative care also following for goals of care.  Family slowly leaning towards comfort care.  Assessment & Plan:   Principal Problem:   Sepsis ([place]) Active Problems:   Chronic pulmonary embolism (HCC)   Aspiration pneumonia (HCC)   Acquired hypothyroidism   History of adrenal insufficiency   Postinflammatory pulmonary fibrosis (HCC)   H/O unilateral nephrectomy   Acute renal failure superimposed on stage 4 chronic kidney disease (HCC)   Acute on chronic hypoxic respiratory failure: Secondary to pneumonia.  Has history of recurrent aspiration pneumonia in the past.  Has history of pulmonary fibrosis and he is on 3 L of oxygen at home.  Currently requiring high flow oxygen.We will try to wean.  Chest x-ray yesterday showed patchy bibasilar opacities are similar to slightly progressed from prior.I will order a dose of Lasix 80 mg once IV.Reasses tomorrow.  Aspiration  pneumonia: CT chest showed bibasilar pneumonia.  Chest x-ray showed right-sided pneumonia.  Continue broad-spectrum antibiotics for now. He has history of aspiration pneumonia in the past and was admitted.  He has history of esophageal dysmotility as per son.  He was nauseated and vomiting at home. Currently afebrile.  His mental status improved yesterday, so he was been started on dysphagia 1 diet.  Altered mental status: Present on admission.Secondary to altered metabolic encephalopathy due to pneumonia, hypoxia. He was much  more awake, alert yesterday but became drowsy/sleepy today.  He was also given few doses of Haldol or Ativan because of agitation at night.  Try to limit sedation.  On his baseline, he is alert and oriented as per son.  Suspicion for Covid-19: Test negative twice.  Son was diagnosed with Covid about 10 days ago.  Patient lives with son.  As per son, he had isolated himself.  Continue airborne precaution as per ID.  Elevated troponin: Has history of coronary disease.  Denies any chest pain presentation.  Troponin peaked to 1900s.  EKG did not show any acute ST changes.  Recent echocardiogram showed ejection fraction of 60-65%, no wall motion abnormality.  Cardiology was consulted, suspected demand ischemia, no further work-up recommended.  Hypertensive urgency: Severely hypertensive on presentation.  Blood pressure fluctuates.  Continue current medicines.  Continue as needed medicines  Elevated D-dimer: Could be multifactorial.  History of PE and was on Coumadin in the past but he stopped due to GI bleed.    History of adrenal insufficiency: On Florinef and steroid at home.I  will hold these due to hypertension.  CKD stage IV/history of right nephrectomy: Currently kidney function is at baseline.  Constipation:Continue  bowel regimen.  Deconditioning/debility/advanced age, multiple comorbidities: Have requested for palliative care consultation.  If his respiratory status does  not improve or if he declines further, he is a candidate for hospice.  Family understands the prognosis.  They are ready to lean towards comfort care if he does not improve.         DVT prophylaxis: Heparin [place] Code Status: DNR Family Communication: Calling son daily .  I called daughter today/ Disposition Plan: Undetermined at this point  Consultants: Cardiology  Procedures: None  Antimicrobials:  Anti-infectives (From admission, onward)   Start     Dose/Rate Route Frequency Ordered Stop   12/23/18 1600  vancomycin (VANCOCIN) 1,500 mg in sodium chloride 0.9 % 500 mL IVPB     1,500 mg 250 mL/hr over 120 Minutes Intravenous Every 48 hours 12/21/18 1318     12/21/18 1400  ceFEPIme (MAXIPIME) 2 g in sodium chloride 0.9 % 100 mL IVPB     2 g 200 mL/hr over 30 Minutes Intravenous Every 24 hours 12/21/18 1316     12/21/18 1300  ceFEPIme (MAXIPIME) 2 g in sodium chloride 0.9 % 100 mL IVPB  Status:  Discontinued     2 g 200 mL/hr over 30 Minutes Intravenous Every 12 hours 12/21/18 1246 12/21/18 1316   12/21/18 1300  vancomycin (VANCOCIN) 2,000 mg in sodium chloride 0.9 % 500 mL IVPB     2,000 mg 250 mL/hr over 120 Minutes Intravenous  Once 12/21/18 1250 12/21/18 1639   12/21/18 0945  cefTRIAXone (ROCEPHIN) 2 g in sodium chloride 0.9 % 100 mL IVPB  Status:  Discontinued     2 g 200 mL/hr over 30 Minutes Intravenous Every 24 hours 12/21/18 0937 12/21/18 1247   12/21/18 0945  azithromycin (ZITHROMAX) 500 mg in sodium chloride 0.9 % 250 mL IVPB  Status:  Discontinued     500 mg 250 mL/hr over 60 Minutes Intravenous Every 24 hours 12/21/18 0937 12/21/18 1247      Subjective:  Patient seen and examined at bedside this morning.  Hypertensive.  Again became drowsy/sleepy today.  Required nonrebreather mask last night and currently on high flow oxygen.  Objective: Vitals:   12/25/18 0835 12/25/18 0939 12/25/18 1001 12/25/18 1017  BP: (!) 185/103 (!) 156/83 (!) 218/134 (!) 168/88  Pulse:  68 66 72 (!) 58  Resp: (!) 23 (!) 21 19 17   Temp:      TempSrc:      SpO2: 100% 100% 90% 100%  Weight:      Height:        Intake/Output Summary (Last 24 hours) at 12/25/2018 1119 Last data filed at 12/25/2018 1013 Gross per 24 hour  Intake 981 ml  Output 1200 ml  Net -219 ml   Filed Weights   12/23/18 0500 12/24/18 0428 12/25/18 0500  Weight: 91.6 kg 89.1 kg 90.7 kg    Examination:  General exam: In mild to moderate respiratory distress, elderly male Respiratory system: Bilateral decreased air entry,basal crackles Cardiovascular system: S1 & S2 heard, RRR. No JVD, murmurs, rubs, gallops or clicks. No pedal edema. Gastrointestinal system: Abdomen is nondistended, soft and nontender. No organomegaly or masses felt. Normal bowel sounds heard. Central nervous system: Sleepy/groggy extremities: No edema, no clubbing ,no cyanosis, distal peripheral pulses palpable. Skin: No rashes, lesions or ulcers,no icterus ,no pallor    Data Reviewed: I  have personally reviewed following labs and imaging studies  CBC: Recent Labs  Lab 12/21/18 0930 12/22/18 0228 12/23/18 0552 12/24/18 0203  WBC 13.6* 18.1* 14.7* 12.3*  NEUTROABS 11.1*  --  12.3* 10.3*  HGB 12.2* 12.3* 11.5* 10.5*  HCT 38.5* 39.4 37.0* 34.4*  MCV 94.8 96.1 97.1 97.2  PLT 222 192 175 0000000   Basic Metabolic Panel: Recent Labs  Lab 12/21/18 0930 12/22/18 0228 12/23/18 0552 12/24/18 0203  NA 139 142 144 143  K 3.4* 4.7 4.4 3.9  CL 104 109 109 113*  CO2 24 21* 21* 20*  GLUCOSE 108* 106* 109* 111*  BUN 34* 37* 52* 55*  CREATININE 2.16* 2.45* 2.57* 2.35*  CALCIUM 8.6* 8.3* 8.5* 8.2*   GFR: Estimated Creatinine Clearance: 21.1 mL/min (A) (by C-G formula based on SCr of 2.35 mg/dL (H)). Liver Function Tests: Recent Labs  Lab 12/21/18 0930 12/22/18 0228  AST 24 54*  ALT 13 17  ALKPHOS 81 59  BILITOT 0.8 0.8  PROT 6.7 6.4*  ALBUMIN 3.5 3.1*   No results for input(s): LIPASE, AMYLASE in the last 168  hours. No results for input(s): AMMONIA in the last 168 hours. Coagulation Profile: No results for input(s): INR, PROTIME in the last 168 hours. Cardiac Enzymes: No results for input(s): CKTOTAL, CKMB, CKMBINDEX, TROPONINI in the last 168 hours. BNP (last 3 results) No results for input(s): PROBNP in the last 8760 hours. HbA1C: No results for input(s): HGBA1C in the last 72 hours. CBG: Recent Labs  Lab 12/24/18 0426 12/24/18 0815 12/24/18 1101 12/24/18 1553 12/24/18 1958  GLUCAP 103* 97 109* 111* 107*   Lipid Profile: No results for input(s): CHOL, HDL, LDLCALC, TRIG, CHOLHDL, LDLDIRECT in the last 72 hours. Thyroid Function Tests: No results for input(s): TSH, T4TOTAL, FREET4, T3FREE, THYROIDAB in the last 72 hours. Anemia Panel: No results for input(s): VITAMINB12, FOLATE, FERRITIN, TIBC, IRON, RETICCTPCT in the last 72 hours. Sepsis Labs: Recent Labs  Lab 12/21/18 0930 12/21/18 1114 12/21/18 1855 12/21/18 2107 12/23/18 0553  PROCALCITON <0.10  --   --   --   --   LATICACIDVEN 2.6* 3.9* 2.5* 3.1* 1.5    Recent Results (from the past 240 hour(s))  Urine culture     Status: None   Collection Time: 12/21/18  9:16 AM   Specimen: Urine, Catheterized  Result Value Ref Range Status   Specimen Description   Final    URINE, CATHETERIZED Performed at [facility], [place] [address], [place], [place] [address]    Special Requests   Final    NONE Performed at [facility], [place] [address], [place], [place] [address]    Culture   Final    NO GROWTH Performed at [facility], [place] [address], [place], [place] [address]    Report Status 12/22/2018 FINAL  Final  Blood Culture (routine x 2)     Status: None (Preliminary result)   Collection Time: 12/21/18  9:30 AM   Specimen: BLOOD  Result Value Ref Range Status   Specimen Description   Final    BLOOD RIGHT ANTECUBITAL Performed at [place] [address], [place], [place] [address]    Special Requests   Final    BOTTLES DRAWN AEROBIC AND ANAEROBIC Blood Culture adequate volume Performed at [place] [address], [place], [place] [address]    Culture   Final    NO GROWTH 4 DAYS Performed at [facility], [place] [address], [place], [place] [address]    Report Status PENDING  Incomplete  SARS Coronavirus 2 by RT PCR (hospital order, performed in [facility] hospital lab) Nasopharyngeal Nasopharyngeal Swab     Status: None   Collection Time: 12/21/18  9:46 AM   Specimen: Nasopharyngeal Swab  Result Value Ref Range Status   SARS Coronavirus 2 NEGATIVE NEGATIVE Final    Comment: (NOTE) If result is NEGATIVE SARS-CoV-2 target nucleic acids are NOT DETECTED. The SARS-CoV-2 RNA is generally detectable in upper and lower  respiratory specimens during the acute phase of infection. The lowest  concentration of SARS-CoV-2 viral copies this assay can detect is 250  copies / mL. A negative result does not preclude SARS-CoV-2 infection  and should not be used as the sole basis for treatment or other  patient management decisions.  A negative result may occur with  improper specimen collection / handling, submission of specimen other  than nasopharyngeal swab, presence of viral mutation(s) within the  areas targeted by this assay, and inadequate number of viral copies  (<250 copies / mL). A negative result must be combined with clinical  observations, patient history, and epidemiological information. If result is POSITIVE SARS-CoV-2 target nucleic acids are DETECTED. The SARS-CoV-2 RNA is generally detectable in upper and lower  respiratory specimens dur ing the acute phase of infection.  Positive  results are indicative of active infection with SARS-CoV-2.  Clinical  correlation with patient history and other diagnostic information is  necessary to determine patient infection status.  Positive results do  not rule  out bacterial infection or co-infection with other viruses. If result is PRESUMPTIVE POSTIVE SARS-CoV-2 nucleic acids MAY BE PRESENT.   A presumptive positive result was obtained on the submitted specimen  and confirmed on repeat testing.  While 2019 novel coronavirus  (SARS-CoV-2) nucleic acids may be present in the submitted sample  additional confirmatory testing may be necessary for epidemiological  and / or clinical management purposes  to differentiate between  SARS-CoV-2 and other Sarbecovirus currently known to infect humans.  If clinically indicated additional testing with an alternate test  methodology [phone]) is advised. The SARS-CoV-2 RNA is generally  detectable in upper and lower respiratory sp ecimens during the acute  phase of infection. The expected result is Negative. Fact Sheet for Patients:  StrictlyIdeas.no Fact Sheet for Healthcare Providers: BankingDealers.co.za This test is not yet approved or cleared by the [place] FDA and has been authorized for detection and/or diagnosis of SARS-CoV-2 by FDA under an Emergency Use Authorization (EUA).  This EUA will remain in effect (meaning this test can be used) for the duration of the COVID-19 declaration under Section 564(b)(1) of the Act, 21 U.S.C. section 360bbb-3(b)(1), unless the authorization is terminated or revoked sooner. Performed at [facility], [place] [address] [place], [place] [address]   Blood Culture (routine x 2)     Status: None (Preliminary result)   Collection Time: 12/21/18 10:00 AM   Specimen: BLOOD  Result Value Ref Range Status   Specimen Description   Final    BLOOD LEFT WRIST Performed at [place] [address], [place], [place] [address]    Special Requests   Final    BOTTLES DRAWN AEROBIC AND ANAEROBIC Blood Culture adequate volume Performed at [place] [address], [place], [place] [address]    Culture   Final    NO GROWTH 4 DAYS Performed  at [facility], [place] [address] [place], [place] [address]    Report Status PENDING  Incomplete  MRSA PCR Screening     Status: None   Collection Time: 12/21/18  5:00 PM   Specimen: Nasopharyngeal Swab  Result Value Ref Range Status   MRSA by PCR NEGATIVE NEGATIVE Final    Comment:        The GeneXpert MRSA Assay (FDA approved for NASAL specimens only), is one component of a comprehensive MRSA colonization surveillance program. It is not intended to diagnose MRSA infection nor to guide or monitor treatment for MRSA infections. Performed at [facility], [place] [address], [place], [place] [address]   SARS CORONAVIRUS 2 (TAT 6-24 HRS) Nasopharyngeal Nasopharyngeal Swab     Status: None   Collection Time: 12/21/18  5:46 PM   Specimen: Nasopharyngeal Swab  Result Value Ref Range Status   SARS Coronavirus 2 NEGATIVE NEGATIVE Final    Comment: (NOTE) SARS-CoV-2 target nucleic acids are NOT DETECTED. The SARS-CoV-2 RNA is generally detectable in upper and lower respiratory specimens during the acute phase of infection. Negative results do not preclude SARS-CoV-2 infection, do not rule out co-infections with other pathogens, and should not be used as the sole basis for treatment or other patient management decisions. Negative results must be combined with clinical observations, patient history, and epidemiological information. The expected result is Negative. Fact Sheet for Patients: SugarRoll.be Fact Sheet for Healthcare Providers: https://www.woods-mathews.com/ This test is not yet approved or cleared by the [place] FDA and  has been authorized for detection and/or diagnosis of SARS-CoV-2 by FDA under an Emergency Use Authorization (EUA). This EUA will remain  in effect (meaning this test can be used) for the duration of the  COVID-19 declaration under Section 56 4(b)(1) of the Act, 21 U.S.C. section 360bbb-3(b)(1), unless the authorization is terminated or revoked sooner. Performed at [facility], [place] [address], [place], [place] [address]          Radiology Studies: Dg Chest Port 1 View  Result Date: 12/24/2018 CLINICAL DATA:  Vomiting, shortness of breath EXAM: PORTABLE CHEST 1 VIEW COMPARISON:  12/21/2018 FINDINGS: Stable cardiomegaly. Slightly improved aeration of the right upper lobe compared to previous chest x-ray. Patchy bibasilar opacities are similar to slightly progressed from prior. No large pleural fluid collection. No pneumothorax. IMPRESSION: 1. Patchy bibasilar opacities are similar to slightly progressed from prior. 2. Slightly improved aeration of the right upper lobe compared to prior. 3. Stable cardiomegaly. Electronically Signed   By: Davina Poke M.D.   On: 12/24/2018 14:03        Scheduled Meds: . amLODipine  10 mg Oral Daily  . aspirin  81 mg Oral Daily  . Chlorhexidine Gluconate Cloth  6 each Topical Daily  . fludrocortisone  0.1 mg Oral Daily  . heparin  5,000 Units Subcutaneous Q8H  . hydrALAZINE  25 mg Oral Q8H  . hydrocortisone  10 mg Oral Daily  . hydrocortisone  5 mg Oral QHS  . isosorbide mononitrate  60 mg Oral BID  . levothyroxine  75 mcg Oral Daily  . mouth rinse  15 mL Mouth Rinse BID  . [START ON 12/26/2018] metoprolol succinate  50 mg Oral Daily  . pravastatin  20 mg Oral q1800  . QUEtiapine  25 mg Oral QHS  . sertraline  50 mg Oral Daily   Continuous Infusions: . ceFEPime (MAXIPIME) IV Stopped (12/24/18 1435)  . dextrose 50 mL/hr at  12/25/18 0749  . vancomycin Stopped (12/23/18 1846)     LOS: 4 days    Time spent: 35 mins.More than 50% of that time was spent in counseling and/or coordination of care.      Shelly Coss, MD [facility] Pager [phone]  If 7PM-7AM, please contact night-coverage [email or web address] Password  [email or web address] 12/25/2018, 11:19 AM

## 2018-12-25 NOTE — Progress Notes (Signed)
Patient on NRB w/ HFNC and O2 sats 100%; NRB removed and HFNC at 15L; O2 sats 89-90%.

## 2018-12-25 NOTE — Consult Note (Signed)
Palliative Care Consult Note  Reason for consult: Goals of care in light of respiratory failure related to likely aspiration PNA  Palliative care consult received.  Chart reviewed including personal review of pertinent labs and imaging.  - I was able to reach patient's son this AM.  We discussed his father's care and attempted to discuss goals moving forward.  Son reported that he "knows about palliative care" and "I think you guys are trying to throw in the towel to early.  Patient's son requested update from primary hospitalist. I advised Dr. Arvid Right and he called family today. - I then discussed with daughter later in the afternoon. Daughter reports that she has better understanding of how sick her father is as she has been able to visit him and sees acute decline from yesterday.  He remains confused but does not seem to be in distress.  She would like to continue current therapies tonight and plan for meeting tomorrow with her and her brother via phone.   - I made addition of hydromorphone in case he develops respiratory distress - Plan for follow-up meeting tomorrow at 10AM  Total time: 60 minutes Greater than 50%  of this time was spent counseling and coordinating care related to the above assessment and plan.  Micheline Rough, MD [facility] [phone]

## 2018-12-26 DIAGNOSIS — N184 Chronic kidney disease, stage 4 (severe): Secondary | ICD-10-CM

## 2018-12-26 DIAGNOSIS — N179 Acute kidney failure, unspecified: Secondary | ICD-10-CM

## 2018-12-26 DIAGNOSIS — I2782 Chronic pulmonary embolism: Secondary | ICD-10-CM

## 2018-12-26 LAB — CBC WITH DIFFERENTIAL/PLATELET
Abs Immature Granulocytes: 0.18 10*3/uL — ABNORMAL HIGH (ref 0.00–0.07)
Basophils Absolute: 0.1 10*3/uL (ref 0.0–0.1)
Basophils Relative: 1 %
Eosinophils Absolute: 0.5 10*3/uL (ref 0.0–0.5)
Eosinophils Relative: 6 %
HCT: 38.9 % — ABNORMAL LOW (ref 39.0–52.0)
Hemoglobin: 12 g/dL — ABNORMAL LOW (ref 13.0–17.0)
Immature Granulocytes: 2 %
Lymphocytes Relative: 15 %
Lymphs Abs: 1.3 10*3/uL (ref 0.7–4.0)
MCH: 29.5 pg (ref 26.0–34.0)
MCHC: 30.8 g/dL (ref 30.0–36.0)
MCV: 95.6 fL (ref 80.0–100.0)
Monocytes Absolute: 0.9 10*3/uL (ref 0.1–1.0)
Monocytes Relative: 10 %
Neutro Abs: 6 10*3/uL (ref 1.7–7.7)
Neutrophils Relative %: 66 %
Platelets: 230 10*3/uL (ref 150–400)
RBC: 4.07 MIL/uL — ABNORMAL LOW (ref 4.22–5.81)
RDW: 14.6 % (ref 11.5–15.5)
WBC: 9.1 10*3/uL (ref 4.0–10.5)
nRBC: 0 % (ref 0.0–0.2)

## 2018-12-26 LAB — CULTURE, BLOOD (ROUTINE X 2)
Culture: NO GROWTH
Culture: NO GROWTH
Special Requests: ADEQUATE
Special Requests: ADEQUATE

## 2018-12-26 LAB — BASIC METABOLIC PANEL
Anion gap: 12 (ref 5–15)
BUN: 48 mg/dL — ABNORMAL HIGH (ref 8–23)
CO2: 23 mmol/L (ref 22–32)
Calcium: 8.6 mg/dL — ABNORMAL LOW (ref 8.9–10.3)
Chloride: 108 mmol/L (ref 98–111)
Creatinine, Ser: 2.42 mg/dL — ABNORMAL HIGH (ref 0.61–1.24)
GFR calc Af Amer: 26 mL/min — ABNORMAL LOW (ref >60)
GFR calc non Af Amer: 22 mL/min — ABNORMAL LOW (ref >60)
Glucose, Bld: 99 mg/dL (ref 70–99)
Potassium: 3.6 mmol/L (ref 3.5–5.1)
Sodium: 143 mmol/L (ref 135–145)

## 2018-12-26 LAB — TROPONIN I (HIGH SENSITIVITY)
Troponin I (High Sensitivity): 403 ng/L (ref <18)
Troponin I (High Sensitivity): 324 ng/L (ref <18)

## 2018-12-26 LAB — GLUCOSE, CAPILLARY
Glucose-Capillary: 88 mg/dL (ref 70–99)
Glucose-Capillary: 88 mg/dL (ref 70–99)
Glucose-Capillary: 92 mg/dL (ref 70–99)
Glucose-Capillary: 94 mg/dL (ref 70–99)
Glucose-Capillary: 97 mg/dL (ref 70–99)
Glucose-Capillary: 88 mg/dL (ref 70–99)

## 2018-12-26 MED ORDER — HYDROMORPHONE HCL 1 MG/ML IJ SOLN
0.2000 mg | INTRAMUSCULAR | Status: DC | PRN
  Administered 2018-12-27 (×2): 0.5 mg via INTRAVENOUS
  Filled 2018-12-26 (×2): qty 1

## 2018-12-26 NOTE — Progress Notes (Signed)
CRITICAL VALUE ALERT  Critical Value:  Troponin 403  Date & Time Notied:  12/26/18 0730  Provider Notified: Dr. Wyonia Hough  Orders Received/Actions taken: No new orders at this time.

## 2018-12-26 NOTE — Progress Notes (Signed)
OT Cancellation Note  Patient Details Name: Leonard Shepard MRN: [id number] DOB: 19-Aug-1922   Cancelled Treatment:    Reason Eval/Treat Not Completed: Other (comment) OT order received, pt chart reviewed. Awaiting palliative care meeting. Will continue to follow as [place] completed.   Zenovia Jarred, MSOT, OTR/L Behavioral Health OT/ Acute Relief OT [facility] Office: [place] 12/26/2018, 11:59 AM

## 2018-12-26 NOTE — Progress Notes (Signed)
CRITICAL VALUE ALERT  Critical Value:  Troponin 324  Date & Time Notied:  10/25 @ 2100  Provider Notified: [facility] on call  Orders Received/Actions taken: Continue to monitor

## 2018-12-26 NOTE — Progress Notes (Signed)
PT Cancellation Note  Patient Details Name: Leonard Shepard MRN: [id number] DOB: 07/28/1922   Cancelled Treatment:    Reason Eval/Treat Not Completed: Medical issues which prohibited therapy--increased BP currently. Also aware of plan for palliative care meeting on today. Will hold PT for now and await palliative care recommendations. Thanks.   Weston Anna, PT Acute Rehabilitation Services Pager: [phone] Office: [phone]

## 2018-12-26 NOTE — Progress Notes (Signed)
Xcover ? Aflutter on central telemetry RN will print out strip and place in chart, please review in AM 12 lead EKG ordered Hr currently in 60's and Afib.   AFIB Trop I q2h x2 Check tsh  Cardiac echo done on 12/21/2018 [place] team to please review rhythm strip as above

## 2018-12-26 NOTE — Progress Notes (Signed)
Daily Progress Note   Patient Name: Leonard Shepard       Date: 12/26/2018 DOB: 19-Dec-1922  Age: 85 y.o. MRN#: [id number] Attending Physician: Marcell Anger* Primary Care Physician: Hennie Duos, MD Admit Date: 12/21/2018  Reason for Consultation/Follow-up: Establishing goals of care  Subjective: I saw Leonard Shepard with limited examination secondary to airborne precautions with Covid pandemic.  I met today with patient's son and daughter.    We had a long discussion reviewing his clinical course this admission as well as pathways of care moving forward.  Family report that the most important things to him are his family and his continued independence (he lives with his son and spends most of his day back-and-forth between his room in chair in the den.)  His children are in agreement that they want to continue with any interventions that may allow him to return to prior state of health, however, they also want to ensure that he does not suffer if we are approaching end-of-life.  We discussed plan for continuation of current interventions and reassessing the situation in the 24 to 48 hours.    I also discussed with family about interventions that they feel may add to his comfort in the interim.  They are primarily concerned with his agitation and needing to be restrained.  Dr. Wyonia Hough called during our meeting and we reviewed plan and he was able to answer all medical questions related to his care.  Length of Stay: 5  Current Medications: Scheduled Meds:  . amLODipine  10 mg Oral Daily  . aspirin  81 mg Oral Daily  . Chlorhexidine Gluconate Cloth  6 each Topical Daily  . heparin  5,000 Units Subcutaneous Q8H  . hydrALAZINE  25 mg Oral Q8H  . isosorbide mononitrate  60  mg Oral BID  . levothyroxine  75 mcg Oral Daily  . mouth rinse  15 mL Mouth Rinse BID  . metoprolol succinate  50 mg Oral Daily  . pravastatin  20 mg Oral q1800  . QUEtiapine  25 mg Oral QHS  . sertraline  50 mg Oral Daily    Continuous Infusions: . ceFEPime (MAXIPIME) IV Stopped (12/25/18 1344)  . dextrose 50 mL/hr at 12/26/18 0924  . vancomycin Stopped (12/25/18 1759)    PRN Meds: acetaminophen **OR** acetaminophen, albuterol, docusate  sodium, haloperidol lactate, HYDROcodone-acetaminophen, hydrocortisone, HYDROmorphone (DILAUDID) injection, labetalol, Melatonin, nitroGLYCERIN, ondansetron, promethazine, Resource ThickenUp Clear  Physical Exam         General: Alert, more  awake, in no acute distress.  Heart: Regular rate and rhythm. No murmur appreciated. Lungs: Decreased Abdomen: Soft, nontender, nondistended, positive bowel sounds.  Ext: No significant edema Skin: Warm and dry  Vital Signs: BP (!) 157/87   Pulse 72   Temp (!) 97.3 F (36.3 C) (Axillary)   Resp (!) 24   Ht 6' (1.829 m)   Wt 87.5 kg   SpO2 96%   BMI 26.16 kg/m  SpO2: SpO2: 96 % O2 Device: O2 Device: High Flow Nasal Cannula O2 Flow Rate: O2 Flow Rate (L/min): 15 L/min  Intake/output summary:   Intake/Output Summary (Last 24 hours) at 12/26/2018 1224 Last data filed at 12/26/2018 3734 Gross per 24 hour  Intake 1718.45 ml  Output 4575 ml  Net -2856.55 ml   LBM: Last BM Date: 12/26/18 Baseline Weight: Weight: 93.4 kg Most recent weight: Weight: 87.5 kg       Palliative Assessment/Data:      Patient Active Problem List   Diagnosis Date Noted  . Supratherapeutic INR 12/09/2016  . Hypoxia 12/09/2016  . HX: anticoagulation   . Anemia due to acute blood loss   . Diverticulitis of colon with bleeding   . Coagulopathy ([place])   . Lower GI bleed 11/20/2016  . Dysphasia 11/16/2016  . Acute renal failure superimposed on stage 4 chronic kidney disease ([place]) 11/16/2016  . Adrenal  insufficiency due to steroid withdrawal ([place]) 11/16/2016  . Hypertension 11/16/2016  . Abdominal aortic atherosclerosis ([place]) 11/10/2016  . Oropharyngeal dysphagia 11/10/2016  . Hemoptysis   . Chronic anticoagulation   . S/P coronary artery stent placement   . Acquired hypothyroidism   . History of adrenal insufficiency   . Postinflammatory pulmonary fibrosis ([place])   . H/O unilateral nephrectomy   . Hx pulmonary embolism   . Aspiration pneumonia ([place]) 11/08/2016  . Orthostasis 06/11/2015  . Sepsis ([place]) 06/11/2015  . Nausea & vomiting 06/11/2015  . Abdominal pain 06/11/2015  . HLD (hyperlipidemia)   . GERD (gastroesophageal reflux disease)   . CAD (coronary artery disease)   . Chronic pulmonary embolism ([place])   . BPH (benign prostatic hyperplasia)   . Chronic renal insufficiency, stage III (moderate)   . COPD (chronic obstructive pulmonary disease) ([place])   . Depression     Palliative Care Assessment & Plan   Patient Profile: 85 year old male with recurrent aspiration pneumonias admitted with hypoxic respiratory failure related to aspiration pneumonia  Assessment: Patient Active Problem List   Diagnosis Date Noted  . Supratherapeutic INR 12/09/2016  . Hypoxia 12/09/2016  . HX: anticoagulation   . Anemia due to acute blood loss   . Diverticulitis of colon with bleeding   . Coagulopathy ([place])   . Lower GI bleed 11/20/2016  . Dysphasia 11/16/2016  . Acute renal failure superimposed on stage 4 chronic kidney disease ([place]) 11/16/2016  . Adrenal insufficiency due to steroid withdrawal ([place]) 11/16/2016  . Hypertension 11/16/2016  . Abdominal aortic atherosclerosis ([place]) 11/10/2016  . Oropharyngeal dysphagia 11/10/2016  . Hemoptysis   . Chronic anticoagulation   . S/P coronary artery stent placement   . Acquired hypothyroidism   . History of adrenal insufficiency   . Postinflammatory pulmonary fibrosis ([place])   . H/O unilateral nephrectomy   . Hx pulmonary embolism   .  Aspiration pneumonia ([place]) 11/08/2016  .  Orthostasis 06/11/2015  . Sepsis ([place]) 06/11/2015  . Nausea & vomiting 06/11/2015  . Abdominal pain 06/11/2015  . HLD (hyperlipidemia)   . GERD (gastroesophageal reflux disease)   . CAD (coronary artery disease)   . Chronic pulmonary embolism ([place])   . BPH (benign prostatic hyperplasia)   . Chronic renal insufficiency, stage III (moderate)   . COPD (chronic obstructive pulmonary disease) ([place])   . Depression    Recommendations/Plan:  Allows sips of water.  Family understands aspiration risk, but this is very important to patient.  Trial of K pad  Trial of low dose dilaudid to see if improvement in pain (reports "miserable" but cannot clarify further)  Hold further ativan. Would preferentially use haldol for agitation.  Hopeful he will be able to be out of restraints if mental status continues to improve.    Goals of Care and Additional Recommendations:  Limitations on Scope of Treatment: Full Scope Treatment  Code Status:    Code Status Orders  (From admission, onward)         Start     Ordered   12/21/18 1445  Do not attempt resuscitation (DNR)  Continuous    Question Answer Comment  In the event of cardiac or respiratory ARREST Do not call a "code blue"   In the event of cardiac or respiratory ARREST Do not perform Intubation, CPR, defibrillation or ACLS   In the event of cardiac or respiratory ARREST Use medication by any route, position, wound care, and other measures to relive pain and suffering. May use oxygen, suction and manual treatment of airway obstruction as needed for comfort.      12/21/18 1444        Code Status History    Date Active Date Inactive Code Status Order ID Comments User Context   11/20/2016 0857 11/23/2016 2121 Full Code [id number]  Reyne Dumas, MD ED   11/08/2016 1230 11/12/2016 2036 Full Code [id number]  Lorella Nimrod, MD ED   06/11/2015 0415 06/12/2015 1655 Full Code [id number]  Ivor Costa, MD ED    Advance Care Planning Activity       Prognosis:   Guarded  Discharge Planning:  To Be Determined  Care plan was discussed with son, daughter, Dr. Wyonia Hough, and RN  Thank you for allowing the Palliative Medicine Team to assist in the care of this patient.   Time In: 1030 Time Out: 1200 Total Time 90 Prolonged Time Billed Yes      Greater than 50%  of this time was spent counseling and coordinating care related to the above assessment and plan.  Micheline Rough, MD  Please contact Palliative Medicine Team phone at [phone] for questions and concerns.

## 2018-12-26 NOTE — Progress Notes (Signed)
PROGRESS NOTE    Leonard Shepard  [id number] DOB: [birth year] DOA: 12/21/2018 PCP: Hennie Duos, MD   Brief Narrative:  Leonard Shepard a 85 y.o.malewith medical history significant forCAD, pulmonary fibrosis on home O2 at 3L/min  chronic PE no longer on coumadin due to severe GI bleed in 2018, HTN, HLD, solitary kidney s/p R nephrectomy, CKD stage IV who presents to the [place] of chills, projectile vomiting, worsening shortness of breath,with sats in the 1s.  Patient has history of aspiration pneumonia multiple times in the past.  He is usually independent, alert and oriented at baseline, ambulates of the with help of walker and lives with his son.  He son tested positive for Covid-19 about 10 days ago but his son says that he isolated himself pretty well.  Covid-19 test negative twice.  CT chest showed right-sided and bibasilar pneumonia on the background of his  interstitial lung disease.  Patient started on  IV antibiotics for aspiration pneumonia. Respiratory status has not improved significantly.  Palliative care also following for goals of care.  Family slowly leaning towards comfort care.   Assessment & Plan:   Principal Problem:   Sepsis ([place]) Active Problems:   Chronic pulmonary embolism (HCC)   Aspiration pneumonia (HCC)   Acquired hypothyroidism   History of adrenal insufficiency   Postinflammatory pulmonary fibrosis (HCC)   H/O unilateral nephrectomy   Acute renal failure superimposed on stage 4 chronic kidney disease (HCC)   Acute on chronic hypoxic respiratory failure: Secondary to pneumonia.  Has history of recurrent aspiration pneumonia in the past.  Has history of pulmonary fibrosis and he is on 3 L of oxygen at home.  Currently requiring high flow oxygen.We will try to wean.  Chest x-ray yesterday showed patchy bibasilar opacities are similar to slightly progressed from prior.received  a dose of Lasix 80 mg 10/25, .improving repeat cxr in AM   Aspiration pneumonia: No changes, CT chest showed bibasilar pneumonia.  Chest x-ray showed right-sided pneumonia.  Continue broad-spectrum antibiotics for now. He has history of aspiration pneumonia in the past and was admitted.  He has history of esophageal dysmotility as per son.  He was nauseated and vomiting at home. Currently afebrile.  His mental status improved yesterday, so he was been started on dysphagia 1 diet.  Altered mental status: Present on admission.Secondary to altered metabolic encephalopathy due to pneumonia, hypoxia. Improved from yesterday, appears to be waxign and waning,  On his baseline, he is alert and oriented as per son.  Suspicion for Covid-19: Test negative twice.  Son was diagnosed with Covid about 10 days ago.  Patient lives with son.  As per son, he had isolated himself.  Continue airborne precaution as per ID.  Elevated troponin: Has history of coronary disease.  Denied any chest pain presentation.  Troponin peaked to 1900s.  EKG did not show any acute ST changes.  Recent echocardiogram showed ejection fraction of 60-65%, no wall motion abnormality.  Cardiology was consulted, no changes-suspected demand ischemia, no further work-up recommended.  Hypertensive urgency: Severely hypertensive on presentation.  Blood pressure fluctuates most recent 157/87.  Continue current medicines.  Continue as needed medicines.  Elevated D-dimer: Could be multifactorial.  History of PE and was on Coumadin in the past but he stopped due to GI bleed.    History of adrenal insufficiency: On Florinef and steroid at home.I will continue to hold these due to hypertension.  CKD stage IV/history of right nephrectomy: Currently kidney function is at baseline.  Constipation:Continue  bowel regimen.  Deconditioning/debility/advanced age, multiple comorbidities: Have requested for palliative care consultation.  If his respiratory status does not improve or if he declines further, he  is a candidate for hospice.  Family understands the prognosis.  They are ready to lean towards comfort care if he does not improve.  DVT prophylaxis: Heparin SQ  Code Status: DNR    Code Status Orders  (From admission, onward)         Start     Ordered   12/21/18 1445  Do not attempt resuscitation (DNR)  Continuous    Question Answer Comment  In the event of cardiac or respiratory ARREST Do not call a "code blue"   In the event of cardiac or respiratory ARREST Do not perform Intubation, CPR, defibrillation or ACLS   In the event of cardiac or respiratory ARREST Use medication by any route, position, wound care, and other measures to relive pain and suffering. May use oxygen, suction and manual treatment of airway obstruction as needed for comfort.      12/21/18 1444        Code Status History    Date Active Date Inactive Code Status Order ID Comments User Context   11/20/2016 0857 11/23/2016 2121 Full Code [id number]  Reyne Dumas, MD ED   11/08/2016 1230 11/12/2016 2036 Full Code [id number]  Lorella Nimrod, MD ED   06/11/2015 0415 06/12/2015 1655 Full Code [id number]  Ivor Costa, MD ED   Advance Care Planning Activity     Family Communication: Discussed with son and daughter and palliative Disposition Plan:   Remain inpatient for continued tx, not medicall stable for d/c Consults called: cardiology Admission status: Inpatient   Consultants:   cardiology  Procedures:  Ct Abdomen Pelvis Wo Contrast  Result Date: 12/21/2018 CLINICAL DATA:  Nausea vomiting since yesterday, confusion. Limited evaluation due to patient condition. EXAM: CT ABDOMEN AND PELVIS WITHOUT CONTRAST TECHNIQUE: Multidetector CT imaging of the abdomen and pelvis was performed following the standard protocol without IV contrast. COMPARISON:  11/08/2016 FINDINGS: Lower chest: Superimposed airspace disease is present upon a background of interstitial lung disease. To a lesser extent similar changes are seen in the  medial left lung base. No signs of pleural effusion. Extensive coronary artery calcifications and evidence of aortic atherosclerosis with stable dilation of the descending thoracic aorta to 3.6 cm. Heart size is enlarged without pericardial effusion. Central pulmonary vasculature is engorged at 3.8 cm. Hepatobiliary: Lobular hepatic contours. No signs of focal lesion. Post cholecystectomy. No signs of biliary ductal dilation. Pancreas: Unremarkable. No pancreatic ductal dilatation or surrounding inflammatory changes. Spleen: Normal in size without focal abnormality. Adrenals/Urinary Tract: Adrenal glands are normal. Post right nephrectomy. Renal cortical scarring noted on the left. No signs of left-sided hydronephrosis or perinephric fluid. Stomach/Bowel: No signs of bowel obstruction. Large volume stool is however present in the rectum but with similar appearance to prior exam. Colonic diverticulosis. Large duodenal diverticulum unchanged. Vascular/Lymphatic: Marked vascular disease with moderate to marked dilation of the infrarenal abdominal aorta with fusiform morphology measuring 4.4 x 4.3 cm compared to 3.8 x 3.8 cm in 2018. No periaortic stranding or hematoma. No signs of adenopathy in the retroperitoneum or in the upper abdomen. No signs of pelvic lymphadenopathy. Reproductive: Foley catheter in situ, circumferential thickening of the urinary bladder, nonspecific finding. Other: No signs of free air. No ascites. Musculoskeletal: Extensive spinal degenerative changes are similar to the previous exam. Skis be no acute bone finding or destructive bone process.  IMPRESSION: Pneumonia superimposed on chronic changes of interstitial lung disease. Aspiration is also considered given history of vomiting. Marked vascular disease with stable dilation of the descending thoracic aorta to 3.6 cm. Infrarenal abdominal aortic aneurysm measuring 4.4 x 4.3 cm compared to 3.8 x 3.8 cm in 2018. Recommend followup by ultrasound  in 1 year. This recommendation follows ACR consensus guidelines: White Paper of the ACR Incidental Findings Committee II on Vascular Findings. J Am Coll Radiol 2013; 10:789-794. Aortic aneurysm NOS (ICD10-I71.9) Coronary artery disease and signs of pulmonary arterial hypertension. Aortic Atherosclerosis (ICD10-I70.0). Electronically Signed   By: Zetta Bills M.D.   On: 12/21/2018 15:32   Dg Chest Port 1 View  Result Date: 12/25/2018 CLINICAL DATA:  Dyspnea EXAM: PORTABLE CHEST 1 VIEW COMPARISON:  Chest radiograph from one day prior. FINDINGS: Stable cardiomediastinal silhouette with mild cardiomegaly. No pneumothorax. No pleural effusion. Hazy and reticular opacities throughout the mid to lower lungs bilaterally, not significantly changed. IMPRESSION: No appreciable change in hazy and reticular opacities throughout the mid to lower lungs bilaterally. Stable mild cardiomegaly. Findings could reflect any of pulmonary edema, atelectasis, pneumonia and/or chronic interstitial lung disease. Electronically Signed   By: Ilona Sorrel M.D.   On: 12/25/2018 11:48   Dg Chest Port 1 View  Result Date: 12/24/2018 CLINICAL DATA:  Vomiting, shortness of breath EXAM: PORTABLE CHEST 1 VIEW COMPARISON:  12/21/2018 FINDINGS: Stable cardiomegaly. Slightly improved aeration of the right upper lobe compared to previous chest x-ray. Patchy bibasilar opacities are similar to slightly progressed from prior. No large pleural fluid collection. No pneumothorax. IMPRESSION: 1. Patchy bibasilar opacities are similar to slightly progressed from prior. 2. Slightly improved aeration of the right upper lobe compared to prior. 3. Stable cardiomegaly. Electronically Signed   By: Davina Poke M.D.   On: 12/24/2018 14:03   Dg Chest Port 1 View  Result Date: 12/21/2018 CLINICAL DATA:  Cough and shortness of breath. Negative COVID-19 test. EXAM: PORTABLE CHEST 1 VIEW COMPARISON:  PA and lateral chest 06/10/2015, 11/08/2016 and  11/10/2016. FINDINGS: There is right upper lobe and bibasilar airspace disease. No pneumothorax. There are likely small bilateral pleural effusions. Cardiomegaly and atherosclerosis are identified. No acute or focal bony abnormality. IMPRESSION: Right upper lobe and bibasilar airspace disease most worrisome for pneumonia. Cardiomegaly. Atherosclerosis. Electronically Signed   By: Inge Rise M.D.   On: 12/21/2018 11:38   Dg Abd Portable 1 View  Result Date: 12/21/2018 CLINICAL DATA:  Cough, shortness of breath and renal failure. EXAM: PORTABLE ABDOMEN - 1 VIEW COMPARISON:  CT abdomen and pelvis 11/08/2016. FINDINGS: The bowel gas pattern is normal. Moderately large stool ball in the rectosigmoid colon noted. No radio-opaque calculi or other significant radiographic abnormality are seen. The patient is status post cholecystectomy. Multilevel lumbar spondylosis is seen. IMPRESSION: No acute finding. Moderately large stool ball in the rectosigmoid colon. Electronically Signed   By: Inge Rise M.D.   On: 12/21/2018 11:39     Antimicrobials:   Cefepime and vancomycin   Subjective: No acute events overnight Still restrained wrists Clinically improving compared to yesterday per staff report  Objective: Vitals:   12/26/18 0800 12/26/18 0900 12/26/18 0945 12/26/18 1030  BP: 138/86 (!) 151/104 (!) 143/84 (!) 157/87  Pulse: 66 69 61 72  Resp: (!) 26 (!) 29 19 (!) 24  Temp:   (!) 97.3 F (36.3 C)   TempSrc:   Axillary   SpO2: 97% 95% 95% 96%  Weight:      Height:  Intake/Output Summary (Last 24 hours) at 12/26/2018 1152 Last data filed at 12/26/2018 0924 Gross per 24 hour  Intake 1718.45 ml  Output 4925 ml  Net -3206.55 ml   Filed Weights   12/24/18 0428 12/25/18 0500 12/26/18 0500  Weight: 89.1 kg 90.7 kg 87.5 kg    Examination:  General exam: calm, confused Respiratory system: rhonchi bilat, no acc muscle use Cardiovascular system: S1 & S2 heard, RRR. No JVD,  murmurs, rubs, gallops or clicks. No pedal edema. Gastrointestinal system: Abdomen is nondistended, soft and nontender. No organomegaly or masses felt. Normal bowel sounds heard. Central nervous system: Alert but confused No focal neurological deficits. Extremities: limited exam but does move extremtiies Skin: No rashes, lesions or ulcers Psychiatry: Judgement and insight are impaired, known cognitive deficits.     Data Reviewed: I have personally reviewed following labs and imaging studies  CBC: Recent Labs  Lab 12/21/18 0930 12/22/18 0228 12/23/18 0552 12/24/18 0203 12/25/18 1116 12/26/18 0619  WBC 13.6* 18.1* 14.7* 12.3* 9.7 9.1  NEUTROABS 11.1*  --  12.3* 10.3* 7.0 6.0  HGB 12.2* 12.3* 11.5* 10.5* 10.8* 12.0*  HCT 38.5* 39.4 37.0* 34.4* 33.9* 38.9*  MCV 94.8 96.1 97.1 97.2 93.9 95.6  PLT 222 192 175 195 PLATELET CLUMPS NOTED ON SMEAR, UNABLE TO ESTIMATE [id number]   Basic Metabolic Panel: Recent Labs  Lab 12/22/18 0228 12/23/18 0552 12/24/18 0203 12/25/18 1116 12/26/18 0257  NA 142 144 143 143 143  K 4.7 4.4 3.9 4.7 3.6  CL 109 109 113* 111 108  CO2 21* 21* 20* 22 23  GLUCOSE 106* 109* 111* 100* 99  BUN 37* 52* 55* 47* 48*  CREATININE 2.45* 2.57* 2.35* 2.19* 2.42*  CALCIUM 8.3* 8.5* 8.2* 8.4* 8.6*   GFR: Estimated Creatinine Clearance: 20.5 mL/min (A) (by C-G formula based on SCr of 2.42 mg/dL (H)). Liver Function Tests: Recent Labs  Lab 12/21/18 0930 12/22/18 0228  AST 24 54*  ALT 13 17  ALKPHOS 81 59  BILITOT 0.8 0.8  PROT 6.7 6.4*  ALBUMIN 3.5 3.1*   No results for input(s): LIPASE, AMYLASE in the last 168 hours. No results for input(s): AMMONIA in the last 168 hours. Coagulation Profile: No results for input(s): INR, PROTIME in the last 168 hours. Cardiac Enzymes: No results for input(s): CKTOTAL, CKMB, CKMBINDEX, TROPONINI in the last 168 hours. BNP (last 3 results) No results for input(s): PROBNP in the last 8760 hours. HbA1C: No results for  input(s): HGBA1C in the last 72 hours. CBG: Recent Labs  Lab 12/25/18 2126 12/25/18 2337 12/26/18 0515 12/26/18 0745 12/26/18 0822  GLUCAP 94 87 88 92 88   Lipid Profile: No results for input(s): CHOL, HDL, LDLCALC, TRIG, CHOLHDL, LDLDIRECT in the last 72 hours. Thyroid Function Tests: No results for input(s): TSH, T4TOTAL, FREET4, T3FREE, THYROIDAB in the last 72 hours. Anemia Panel: No results for input(s): VITAMINB12, FOLATE, FERRITIN, TIBC, IRON, RETICCTPCT in the last 72 hours. Sepsis Labs: Recent Labs  Lab 12/21/18 0930 12/21/18 1114 12/21/18 1855 12/21/18 2107 12/23/18 0553  PROCALCITON <0.10  --   --   --   --   LATICACIDVEN 2.6* 3.9* 2.5* 3.1* 1.5    Recent Results (from the past 240 hour(s))  Urine culture     Status: None   Collection Time: 12/21/18  9:16 AM   Specimen: Urine, Catheterized  Result Value Ref Range Status   Specimen Description   Final    URINE, CATHETERIZED Performed at [facility], [address] [place], [place] [address]    Special Requests   Final    NONE Performed at [facility], [place] [address], [place], [place] [address]    Culture   Final    NO GROWTH Performed at [facility], [place] [address], [place], [place] [address]    Report Status 12/22/2018 FINAL  Final  Blood Culture (routine x 2)     Status: None (Preliminary result)   Collection Time: 12/21/18  9:30 AM   Specimen: BLOOD  Result Value Ref Range Status   Specimen Description   Final    BLOOD RIGHT ANTECUBITAL Performed at [place] [address], [place], [place] [address]    Special Requests   Final    BOTTLES DRAWN AEROBIC AND ANAEROBIC Blood Culture adequate volume Performed at [place] [address], [place], [place] [address]    Culture   Final    NO GROWTH 4 DAYS Performed at [facility], [place] [address] [place],  [address]    Report Status PENDING   Incomplete  SARS Coronavirus 2 by RT PCR (hospital order, performed in [facility] hospital lab) Nasopharyngeal Nasopharyngeal Swab     Status: None   Collection Time: 12/21/18  9:46 AM   Specimen: Nasopharyngeal Swab  Result Value Ref Range Status   SARS Coronavirus 2 NEGATIVE NEGATIVE Final    Comment: (NOTE) If result is NEGATIVE SARS-CoV-2 target nucleic acids are NOT DETECTED. The SARS-CoV-2 RNA is generally detectable in upper and lower  respiratory specimens during the acute phase of infection. The lowest  concentration of SARS-CoV-2 viral copies this assay can detect is 250  copies / mL. A negative result does not preclude SARS-CoV-2 infection  and should not be used as the sole basis for treatment or other  patient management decisions.  A negative result may occur with  improper specimen collection / handling, submission of specimen other  than nasopharyngeal swab, presence of viral mutation(s) within the  areas targeted by this assay, and inadequate number of viral copies  (<250 copies / mL). A negative result must be combined with clinical  observations, patient history, and epidemiological information. If result is POSITIVE SARS-CoV-2 target nucleic acids are DETECTED. The SARS-CoV-2 RNA is generally detectable in upper and lower  respiratory specimens dur ing the acute phase of infection.  Positive  results are indicative of active infection with SARS-CoV-2.  Clinical  correlation with patient history and other diagnostic information is  necessary to determine patient infection status.  Positive results do  not rule out bacterial infection or co-infection with other viruses. If result is PRESUMPTIVE POSTIVE SARS-CoV-2 nucleic acids MAY BE PRESENT.   A presumptive positive result was obtained on the submitted specimen  and confirmed on repeat testing.  While 2019 novel coronavirus  (SARS-CoV-2) nucleic acids may be present in the submitted sample  additional  confirmatory testing may be necessary for epidemiological  and / or clinical management purposes  to differentiate between  SARS-CoV-2 and other Sarbecovirus currently known to infect humans.  If clinically indicated additional testing with an alternate test  methodology [phone]) is advised. The SARS-CoV-2 RNA is generally  detectable in upper and lower respiratory sp ecimens during the acute  phase of infection. The expected result is Negative. Fact Sheet for Patients:  StrictlyIdeas.no Fact Sheet for Healthcare Providers: BankingDealers.co.za This test is not yet approved or cleared by the [place] FDA and has  been authorized for detection and/or diagnosis of SARS-CoV-2 by FDA under an Emergency Use Authorization (EUA).  This EUA will remain in effect (meaning this test can be used) for the duration of the COVID-19 declaration under Section 564(b)(1) of the Act, 21 U.S.C. section 360bbb-3(b)(1), unless the authorization is terminated or revoked sooner. Performed at [facility], [place] [address], [place], [place] [address]   Blood Culture (routine x 2)     Status: None (Preliminary result)   Collection Time: 12/21/18 10:00 AM   Specimen: BLOOD  Result Value Ref Range Status   Specimen Description   Final    BLOOD LEFT WRIST Performed at [place] [address] [place], [place] [address]    Special Requests   Final    BOTTLES DRAWN AEROBIC AND ANAEROBIC Blood Culture adequate volume Performed at [place] [address] [place], [place] [address]    Culture   Final    NO GROWTH 4 DAYS Performed at [facility], [place] [address], [place], [place] [address]    Report Status PENDING  Incomplete  MRSA PCR Screening     Status: None   Collection Time: 12/21/18  5:00 PM   Specimen: Nasopharyngeal Swab  Result Value Ref Range Status   MRSA by PCR NEGATIVE  NEGATIVE Final    Comment:        The GeneXpert MRSA Assay (FDA approved for NASAL specimens only), is one component of a comprehensive MRSA colonization surveillance program. It is not intended to diagnose MRSA infection nor to guide or monitor treatment for MRSA infections. Performed at [facility], [place] [address] [place], [place] [address]   SARS CORONAVIRUS 2 (TAT 6-24 HRS) Nasopharyngeal Nasopharyngeal Swab     Status: None   Collection Time: 12/21/18  5:46 PM   Specimen: Nasopharyngeal Swab  Result Value Ref Range Status   SARS Coronavirus 2 NEGATIVE NEGATIVE Final    Comment: (NOTE) SARS-CoV-2 target nucleic acids are NOT DETECTED. The SARS-CoV-2 RNA is generally detectable in upper and lower respiratory specimens during the acute phase of infection. Negative results do not preclude SARS-CoV-2 infection, do not rule out co-infections with other pathogens, and should not be used as the sole basis for treatment or other patient management decisions. Negative results must be combined with clinical observations, patient history, and epidemiological information. The expected result is Negative. Fact Sheet for Patients: SugarRoll.be Fact Sheet for Healthcare Providers: https://www.woods-mathews.com/ This test is not yet approved or cleared by the [place] FDA and  has been authorized for detection and/or diagnosis of SARS-CoV-2 by FDA under an Emergency Use Authorization (EUA). This EUA will remain  in effect (meaning this test can be used) for the duration of the COVID-19 declaration under Section 56 4(b)(1) of the Act, 21 U.S.C. section 360bbb-3(b)(1), unless the authorization is terminated or revoked sooner. Performed at [facility], [place] [address], [place], [place] [address]          Radiology Studies: Dg Chest Port 1 View  Result Date: 12/25/2018 CLINICAL DATA:  Dyspnea EXAM: PORTABLE  CHEST 1 VIEW COMPARISON:  Chest radiograph from one day prior. FINDINGS: Stable cardiomediastinal silhouette with mild cardiomegaly. No pneumothorax. No pleural effusion. Hazy and reticular opacities throughout the mid to lower lungs bilaterally, not significantly changed. IMPRESSION: No appreciable change in hazy and reticular opacities throughout the mid to lower lungs bilaterally. Stable mild cardiomegaly. Findings could reflect any of pulmonary edema,  atelectasis, pneumonia and/or chronic interstitial lung disease. Electronically Signed   By: Ilona Sorrel M.D.   On: 12/25/2018 11:48   Dg Chest Port 1 View  Result Date: 12/24/2018 CLINICAL DATA:  Vomiting, shortness of breath EXAM: PORTABLE CHEST 1 VIEW COMPARISON:  12/21/2018 FINDINGS: Stable cardiomegaly. Slightly improved aeration of the right upper lobe compared to previous chest x-ray. Patchy bibasilar opacities are similar to slightly progressed from prior. No large pleural fluid collection. No pneumothorax. IMPRESSION: 1. Patchy bibasilar opacities are similar to slightly progressed from prior. 2. Slightly improved aeration of the right upper lobe compared to prior. 3. Stable cardiomegaly. Electronically Signed   By: Davina Poke M.D.   On: 12/24/2018 14:03        Scheduled Meds: . amLODipine  10 mg Oral Daily  . aspirin  81 mg Oral Daily  . Chlorhexidine Gluconate Cloth  6 each Topical Daily  . heparin  5,000 Units Subcutaneous Q8H  . hydrALAZINE  25 mg Oral Q8H  . isosorbide mononitrate  60 mg Oral BID  . levothyroxine  75 mcg Oral Daily  . mouth rinse  15 mL Mouth Rinse BID  . metoprolol succinate  50 mg Oral Daily  . pravastatin  20 mg Oral q1800  . QUEtiapine  25 mg Oral QHS  . sertraline  50 mg Oral Daily   Continuous Infusions: . ceFEPime (MAXIPIME) IV Stopped (12/25/18 1344)  . dextrose 50 mL/hr at 12/26/18 0924  . vancomycin Stopped (12/25/18 1759)     LOS: 5 days    Time spent: 17 min    Nicolette Bang, MD [facility]  If 7PM-7AM, please contact night-coverage  12/26/2018, 11:52 AM

## 2018-12-27 LAB — CBC WITH DIFFERENTIAL/PLATELET
Abs Immature Granulocytes: 0.22 10*3/uL — ABNORMAL HIGH (ref 0.00–0.07)
Basophils Absolute: 0.1 10*3/uL (ref 0.0–0.1)
Basophils Relative: 1 %
Eosinophils Absolute: 0.5 10*3/uL (ref 0.0–0.5)
Eosinophils Relative: 5 %
HCT: 37.5 % — ABNORMAL LOW (ref 39.0–52.0)
Hemoglobin: 11.7 g/dL — ABNORMAL LOW (ref 13.0–17.0)
Immature Granulocytes: 2 %
Lymphocytes Relative: 16 %
Lymphs Abs: 1.7 10*3/uL (ref 0.7–4.0)
MCH: 29.8 pg (ref 26.0–34.0)
MCHC: 31.2 g/dL (ref 30.0–36.0)
MCV: 95.4 fL (ref 80.0–100.0)
Monocytes Absolute: 1.1 10*3/uL — ABNORMAL HIGH (ref 0.1–1.0)
Monocytes Relative: 10 %
Neutro Abs: 7 10*3/uL (ref 1.7–7.7)
Neutrophils Relative %: 66 %
Platelets: 239 10*3/uL (ref 150–400)
RBC: 3.93 MIL/uL — ABNORMAL LOW (ref 4.22–5.81)
RDW: 14.5 % (ref 11.5–15.5)
WBC: 10.5 10*3/uL (ref 4.0–10.5)
nRBC: 0 % (ref 0.0–0.2)

## 2018-12-27 LAB — GLUCOSE, CAPILLARY
Glucose-Capillary: 93 mg/dL (ref 70–99)
Glucose-Capillary: 103 mg/dL — ABNORMAL HIGH (ref 70–99)
Glucose-Capillary: 100 mg/dL — ABNORMAL HIGH (ref 70–99)
Glucose-Capillary: 99 mg/dL (ref 70–99)

## 2018-12-27 LAB — BASIC METABOLIC PANEL
Anion gap: 13 (ref 5–15)
BUN: 43 mg/dL — ABNORMAL HIGH (ref 8–23)
CO2: 22 mmol/L (ref 22–32)
Calcium: 8.5 mg/dL — ABNORMAL LOW (ref 8.9–10.3)
Chloride: 108 mmol/L (ref 98–111)
Creatinine, Ser: 2.25 mg/dL — ABNORMAL HIGH (ref 0.61–1.24)
GFR calc Af Amer: 28 mL/min — ABNORMAL LOW (ref >60)
GFR calc non Af Amer: 24 mL/min — ABNORMAL LOW (ref >60)
Glucose, Bld: 97 mg/dL (ref 70–99)
Potassium: 3.1 mmol/L — ABNORMAL LOW (ref 3.5–5.1)
Sodium: 143 mmol/L (ref 135–145)

## 2018-12-27 MED ORDER — POTASSIUM CHLORIDE 10 MEQ/100ML IV SOLN
10.0000 meq | INTRAVENOUS | Status: AC
  Administered 2018-12-27 (×2): 10 meq via INTRAVENOUS
  Filled 2018-12-27 (×2): qty 100

## 2018-12-27 MED ORDER — POTASSIUM CHLORIDE 10 MEQ/100ML IV SOLN: 10.0000 meq | INTRAVENOUS | Status: DC

## 2018-12-27 MED ORDER — INFLUENZA VAC A&B SA ADJ QUAD 0.5 ML IM PRSY
0.5000 mL | PREFILLED_SYRINGE | INTRAMUSCULAR | Status: DC
  Filled 2018-12-27: qty 0.5

## 2018-12-27 NOTE — Progress Notes (Signed)
Pharmacy Antibiotic Note  Leonard Shepard is a 85 y.o. male with hx pulmonary fibrosis presented to the ED on 10/20 with c/o emesis and SOB. COVID test was negative.  Patient's currently on vancomycin and cefepime for suspected PNA.  Today, 12/27/2018: - Day #7 abx -  afeb, wbc wnl -  scr labile- down 2.25 (crcl~22), UOP 0.9 ml/kg/hr -- has one kidney   Plan: - cefepime 2gm IV q24h - vancomycin 1500 mg IV q48h for est AUC 527 - MRSA PCR is negative consider d/c vancomycin if appropriate - Please indicate plan/ LOT for abx. If to continue with vancomycin, pharmacy will plan on checking vancomycin levels soon _______________________________________  Height: 6' (182.9 cm) Weight: 192 lb 14.4 oz (87.5 kg) IBW/kg (Calculated) : 77.6  Temp (24hrs), Avg:97.6 F (36.4 C), Min:97.1 F (36.2 C), Max:98.6 F (37 C)  Recent Labs  Lab 12/21/18 0930 12/21/18 1114 12/21/18 1855 12/21/18 2107  12/23/18 0552 12/23/18 0553 12/24/18 0203 12/25/18 1116 12/26/18 0257 12/26/18 0619 12/27/18 0233  WBC 13.6*  --   --   --    < > 14.7*  --  12.3* 9.7  --  9.1 10.5  CREATININE 2.16*  --   --   --    < > 2.57*  --  2.35* 2.19* 2.42*  --  2.25*  LATICACIDVEN 2.6* 3.9* 2.5* 3.1*  --   --  1.5  --   --   --   --   --    < > = values in this interval not displayed.    Estimated Creatinine Clearance: 22 mL/min (A) (by C-G formula based on SCr of 2.25 mg/dL (H)).    Allergies  Allergen Reactions  . Aclidinium Bromide Itching and Other (See Comments)    Throat irritation     Thank you for allowing pharmacy to be a part of this patient's care.  Lynelle Doctor 12/27/2018 9:21 AM

## 2018-12-27 NOTE — Progress Notes (Signed)
Daily Progress Note   Patient Name: Leonard Shepard       Date: 12/27/2018 DOB: [birth year]  Age: 85 y.o. MRN#: [id number] Attending Physician: Marcell Anger* Primary Care Physician: Hennie Duos, MD Admit Date: 12/21/2018  Reason for Consultation/Follow-up: Establishing goals of care  Subjective: I saw Mr. Krogstad this AM.  Requiring restraints due to confusion.  I spoke with his daughter this evening via phone.  She reports being able to speak with Dr. Wyonia Hough today and she reports seeing "no real change either way" in her father's condition.  She reports that he has periods of agitation and restlessness that improve with pain management.  We discussed plan for continuation of current interventions and reassessing the situation again in the 24 to 48 hours.    Length of Stay: 6  Current Medications: Scheduled Meds:   amLODipine  10 mg Oral Daily   aspirin  81 mg Oral Daily   Chlorhexidine Gluconate Cloth  6 each Topical Daily   heparin  5,000 Units Subcutaneous Q8H   hydrALAZINE  25 mg Oral Q8H   [START ON 12/28/2018] influenza vaccine adjuvanted  0.5 mL Intramuscular Tomorrow-1000   isosorbide mononitrate  60 mg Oral BID   levothyroxine  75 mcg Oral Daily   mouth rinse  15 mL Mouth Rinse BID   metoprolol succinate  50 mg Oral Daily   pravastatin  20 mg Oral q1800   QUEtiapine  25 mg Oral QHS   sertraline  50 mg Oral Daily    Continuous Infusions:  ceFEPime (MAXIPIME) IV Stopped (12/27/18 1453)   dextrose 50 mL/hr at 12/27/18 2000    PRN Meds: acetaminophen **OR** acetaminophen, albuterol, docusate sodium, haloperidol lactate, HYDROcodone-acetaminophen, hydrocortisone, HYDROmorphone (DILAUDID) injection, labetalol, Melatonin, nitroGLYCERIN,  ondansetron, promethazine, Resource ThickenUp Clear  Physical Exam         General: Alert, more  awake, in no acute distress.  Heart: Regular rate and rhythm. No murmur appreciated. Lungs: Decreased Abdomen: Soft, nontender, nondistended, positive bowel sounds.  Ext: No significant edema Skin: Warm and dry  Vital Signs: BP (!) 136/55 (BP Location: Left Arm)    Pulse 62    Temp 98.1 F (36.7 C) (Axillary)    Resp (!) 21    Ht 6' (1.829 m)    Wt 87.5 kg  SpO2 100%    BMI 26.16 kg/m  SpO2: SpO2: 100 % O2 Device: O2 Device: NRB O2 Flow Rate: O2 Flow Rate (L/min): 15 L/min  Intake/output summary:   Intake/Output Summary (Last 24 hours) at 12/27/2018 2201 Last data filed at 12/27/2018 2000 Gross per 24 hour  Intake 1452.87 ml  Output 1400 ml  Net 52.87 ml   LBM: Last BM Date: 12/26/18 Baseline Weight: Weight: 93.4 kg Most recent weight: Weight: 87.5 kg       Palliative Assessment/Data:      Patient Active Problem List   Diagnosis Date Noted   Supratherapeutic INR 12/09/2016   Hypoxia 12/09/2016   HX: anticoagulation    Anemia due to acute blood loss    Diverticulitis of colon with bleeding    Coagulopathy (HCC)    Lower GI bleed 11/20/2016   Dysphasia 11/16/2016   Acute renal failure superimposed on stage 4 chronic kidney disease ([place]) 11/16/2016   Adrenal insufficiency due to steroid withdrawal ([place]) 11/16/2016   Hypertension 11/16/2016   Abdominal aortic atherosclerosis ([place]) 11/10/2016   Oropharyngeal dysphagia 11/10/2016   Hemoptysis    Chronic anticoagulation    S/P coronary artery stent placement    Acquired hypothyroidism    History of adrenal insufficiency    Postinflammatory pulmonary fibrosis ([place])    H/O unilateral nephrectomy    Hx pulmonary embolism    Aspiration pneumonia ([place]) 11/08/2016   Orthostasis 06/11/2015   Sepsis ([place]) 06/11/2015   Nausea & vomiting 06/11/2015   Abdominal pain 06/11/2015   HLD  (hyperlipidemia)    GERD (gastroesophageal reflux disease)    CAD (coronary artery disease)    Chronic pulmonary embolism (HCC)    BPH (benign prostatic hyperplasia)    Chronic renal insufficiency, stage III (moderate)    COPD (chronic obstructive pulmonary disease) ([place])    Depression     Palliative Care Assessment & Plan   Patient Profile: 85 year old male with recurrent aspiration pneumonias admitted with hypoxic respiratory failure related to aspiration pneumonia  Assessment: Patient Active Problem List   Diagnosis Date Noted   Supratherapeutic INR 12/09/2016   Hypoxia 12/09/2016   HX: anticoagulation    Anemia due to acute blood loss    Diverticulitis of colon with bleeding    Coagulopathy ([place])    Lower GI bleed 11/20/2016   Dysphasia 11/16/2016   Acute renal failure superimposed on stage 4 chronic kidney disease ([place]) 11/16/2016   Adrenal insufficiency due to steroid withdrawal ([place]) 11/16/2016   Hypertension 11/16/2016   Abdominal aortic atherosclerosis ([place]) 11/10/2016   Oropharyngeal dysphagia 11/10/2016   Hemoptysis    Chronic anticoagulation    S/P coronary artery stent placement    Acquired hypothyroidism    History of adrenal insufficiency    Postinflammatory pulmonary fibrosis ([place])    H/O unilateral nephrectomy    Hx pulmonary embolism    Aspiration pneumonia ([place]) 11/08/2016   Orthostasis 06/11/2015   Sepsis ([place]) 06/11/2015   Nausea & vomiting 06/11/2015   Abdominal pain 06/11/2015   HLD (hyperlipidemia)    GERD (gastroesophageal reflux disease)    CAD (coronary artery disease)    Chronic pulmonary embolism (HCC)    BPH (benign prostatic hyperplasia)    Chronic renal insufficiency, stage III (moderate)    COPD (chronic obstructive pulmonary disease) ([place])    Depression    Recommendations/Plan:  Allows sips of water as tolerated.  Family understands aspiration risk, but this is very important to  patient.  Continue of low dose dilaudid  to see if improvement in pain (reports "miserable" but cannot clarify further)  Hold further ativan. Would preferentially use haldol for agitation.  Hopeful he will be able to be out of restraints if mental status continues to improve.  Plan to reassess his situation in another 24-48 hours.   Code Status:    Code Status Orders  (From admission, onward)         Start     Ordered   12/21/18 1445  Do not attempt resuscitation (DNR)  Continuous    Question Answer Comment  In the event of cardiac or respiratory ARREST Do not call a code blue   In the event of cardiac or respiratory ARREST Do not perform Intubation, CPR, defibrillation or ACLS   In the event of cardiac or respiratory ARREST Use medication by any route, position, wound care, and other measures to relive pain and suffering. May use oxygen, suction and manual treatment of airway obstruction as needed for comfort.      12/21/18 1444        Code Status History    Date Active Date Inactive Code Status Order ID Comments User Context   11/20/2016 0857 11/23/2016 2121 Full Code [id number]  Reyne Dumas, MD ED   11/08/2016 1230 11/12/2016 2036 Full Code [id number]  Lorella Nimrod, MD ED   06/11/2015 0415 06/12/2015 1655 Full Code [id number]  Ivor Costa, MD ED   Advance Care Planning Activity       Prognosis:   Guarded  Discharge Planning:  To Be Determined  Care plan was discussed with daughter and RN  Thank you for allowing the Palliative Medicine Team to assist in the care of this patient.   Total Time 20 Prolonged Time Billed Yes      Greater than 50%  of this time was spent counseling and coordinating care related to the above assessment and plan.  Micheline Rough, MD  Please contact Palliative Medicine Team phone at [phone] for questions and concerns.

## 2018-12-27 NOTE — Progress Notes (Signed)
PT Cancellation Note  Patient Details Name: Leonard Shepard MRN: [id number] DOB: 03/04/1922   Cancelled Treatment:    Reason Eval/Treat Not Completed: Other (comment) Pt being repositioned by nursing and currently in restraints.  Pt remains confused.  Will check back as schedule permits.   Magdalynn Davilla,KATHrine E 12/27/2018, 2:57 PM Carmelia Bake, PT, DPT Acute Rehabilitation Services Office: [phone] Pager: [phone]

## 2018-12-27 NOTE — Progress Notes (Signed)
OT Cancellation Note  Patient Details Name: Leonard Shepard MRN: [id number] DOB: 09/21/1922   Cancelled Treatment:    Reason Eval/Treat Not Completed: Other (comment)   Pt in restraints and confused.  Will check back on pt next day  Kari Baars, [place] [phone] Office- [phone], Thereasa Parkin 12/27/2018, 3:47 PM

## 2018-12-27 NOTE — Progress Notes (Signed)
PROGRESS NOTE    Leonard Shepard  [id number] DOB: 07-24-1922 DOA: 12/21/2018 PCP: Hennie Duos, MD   Brief Narrative:  Leonard Shepard a 85 y.o.malewith medical history significant forCAD, pulmonary fibrosis on home O2 at 3L/min chronic PE no longer on coumadin due to severe GI bleed in 2018, HTN, HLD, solitary kidney s/p R nephrectomy, CKD stage IV who presents to the [place] of chills, projectile vomiting, worsening shortness of breath,with sats in the 70s. Patient has history of aspiration pneumonia multiple times in the past. He is usually independent, alert and oriented at baseline, ambulates of the with help of walker and lives with his son. He son tested positive for Covid-19 about 10 days ago but his son says that he isolated himself pretty well. Covid-19 test negative twice. CT chest showed right-sided and bibasilar pneumonia on the background of his interstitial lung disease. Patient started on IV antibiotics for aspiration pneumonia. Respiratory status has not improved significantly. Palliative care also following for goals of care. Family slowly leaning towards comfort care   Assessment & Plan:   Principal Problem:   Sepsis ([place]) Active Problems:   Chronic pulmonary embolism (HCC)   Aspiration pneumonia ([place])   Acquired hypothyroidism   History of adrenal insufficiency   Postinflammatory pulmonary fibrosis (HCC)   H/O unilateral nephrectomy   Acute renal failure superimposed on stage 4 chronic kidney disease (HCC)   Acute on chronic hypoxic respiratory failure: Secondary to pneumonia. Has history of recurrent aspiration pneumonia in the past. Has history of pulmonary fibrosis and he is on 3 L of oxygen at home. Currently STILL requiring high flow oxygen.We will try to wean.Chest x-ray yesterday showedpatchy bibasilar opacities are similar to slightly progressed from prior.received  a dose of Lasix 80 mg 10/25, .improving repeat cxr in AM  10/27  Aspiration pneumonia: No changes, CT chest showed bibasilar pneumonia. Chest x-ray showed right-sided pneumonia. Continue broad-spectrum antibiotics for now. He has history of aspiration pneumonia in the past and was admitted. He has history of esophageal dysmotility as per son. He was nauseated and vomiting at home. Currently afebrile.  Altered mental status:Present on admission.Secondary to altered metabolic encephalopathy due to pneumonia, hypoxia. appears to be waxign and waning not a significant change from yesterday, On his baseline, he is alert and oriented as per son.  Suspicion for Covid-19:Test negative twice. Son was diagnosed with Covid about 10 days ago. Patient lives with son. As per son, he had isolated himself. Continue airborne precaution as per ID.  Elevated troponin:Has history of coronary disease. Denied any chest pain presentation. Troponin peaked to 1900s. EKG did not show any acute ST changes. Recent echocardiogram showed ejection fraction of 60-65%, no wall motion abnormality. Cardiology was consulted, no changes-suspected demand ischemia, no further work-up recommended.  Hypertensive urgency: Severely hypertensive on presentation.Blood pressure fluctuates significantly, most recent 161/92.Continue current medicines.Continue as needed medicines.  Elevated D-dimer: Could be multifactorial. History of PE and was on Coumadin in the past but he stopped due to GI bleed.   History of adrenal insufficiency:On Florinef and steroid at home.I will continue tohold these due to hypertension.  CKD stage IV/history of right nephrectomy: Currently kidney function is at baseline.  Constipation:Continue bowel regimen.  Deconditioning/debility/advanced age, multiple comorbidities: Have requested for palliative care consultation. If his respiratory status does not improve or if he declines further, he is a candidate for hospice. Family  understandsthe prognosis. They are ready to lean towards comfort care if he does not improve.  DVT prophylaxis:  Heparin SQ  Code Status: DNR    Code Status Orders  (From admission, onward)         Start     Ordered   12/21/18 1445  Do not attempt resuscitation (DNR)  Continuous    Question Answer Comment  In the event of cardiac or respiratory ARREST Do not call a code blue   In the event of cardiac or respiratory ARREST Do not perform Intubation, CPR, defibrillation or ACLS   In the event of cardiac or respiratory ARREST Use medication by any route, position, wound care, and other measures to relive pain and suffering. May use oxygen, suction and manual treatment of airway obstruction as needed for comfort.      12/21/18 1444        Code Status History    Date Active Date Inactive Code Status Order ID Comments User Context   11/20/2016 0857 11/23/2016 2121 Full Code [id number]  Reyne Dumas, MD ED   11/08/2016 1230 11/12/2016 2036 Full Code [id number]  Lorella Nimrod, MD ED   06/11/2015 0415 06/12/2015 1655 Full Code [id number]  Ivor Costa, MD ED   Advance Care Planning Activity     Family Communication: Discussed with daughter at bedside Disposition Plan:   Patient remained inpatient for continued IV antibiotics and respiratory support.  Patient is a poor prognosis family considering comfort care.  Not stable for medical discharge  consults called: None Admission status: Inpatient   Consultants:   CARDIOLOGY  Procedures:  Ct Abdomen Pelvis Wo Contrast  Result Date: 12/21/2018 CLINICAL DATA:  Nausea vomiting since yesterday, confusion. Limited evaluation due to patient condition. EXAM: CT ABDOMEN AND PELVIS WITHOUT CONTRAST TECHNIQUE: Multidetector CT imaging of the abdomen and pelvis was performed following the standard protocol without IV contrast. COMPARISON:  11/08/2016 FINDINGS: Lower chest: Superimposed airspace disease is present upon a background of interstitial lung  disease. To a lesser extent similar changes are seen in the medial left lung base. No signs of pleural effusion. Extensive coronary artery calcifications and evidence of aortic atherosclerosis with stable dilation of the descending thoracic aorta to 3.6 cm. Heart size is enlarged without pericardial effusion. Central pulmonary vasculature is engorged at 3.8 cm. Hepatobiliary: Lobular hepatic contours. No signs of focal lesion. Post cholecystectomy. No signs of biliary ductal dilation. Pancreas: Unremarkable. No pancreatic ductal dilatation or surrounding inflammatory changes. Spleen: Normal in size without focal abnormality. Adrenals/Urinary Tract: Adrenal glands are normal. Post right nephrectomy. Renal cortical scarring noted on the left. No signs of left-sided hydronephrosis or perinephric fluid. Stomach/Bowel: No signs of bowel obstruction. Large volume stool is however present in the rectum but with similar appearance to prior exam. Colonic diverticulosis. Large duodenal diverticulum unchanged. Vascular/Lymphatic: Marked vascular disease with moderate to marked dilation of the infrarenal abdominal aorta with fusiform morphology measuring 4.4 x 4.3 cm compared to 3.8 x 3.8 cm in 2018. No periaortic stranding or hematoma. No signs of adenopathy in the retroperitoneum or in the upper abdomen. No signs of pelvic lymphadenopathy. Reproductive: Foley catheter in situ, circumferential thickening of the urinary bladder, nonspecific finding. Other: No signs of free air. No ascites. Musculoskeletal: Extensive spinal degenerative changes are similar to the previous exam. Skis be no acute bone finding or destructive bone process. IMPRESSION: Pneumonia superimposed on chronic changes of interstitial lung disease. Aspiration is also considered given history of vomiting. Marked vascular disease with stable dilation of the descending thoracic aorta to 3.6 cm. Infrarenal abdominal aortic aneurysm measuring 4.4 x 4.3 cm compared  to 3.8 x 3.8 cm in 2018. Recommend followup by ultrasound in 1 year. This recommendation follows ACR consensus guidelines: White Paper of the ACR Incidental Findings Committee II on Vascular Findings. J Am Coll Radiol 2013; 10:789-794. Aortic aneurysm NOS (ICD10-I71.9) Coronary artery disease and signs of pulmonary arterial hypertension. Aortic Atherosclerosis (ICD10-I70.0). Electronically Signed   By: Zetta Bills M.D.   On: 12/21/2018 15:32   Dg Chest Port 1 View  Result Date: 12/25/2018 CLINICAL DATA:  Dyspnea EXAM: PORTABLE CHEST 1 VIEW COMPARISON:  Chest radiograph from one day prior. FINDINGS: Stable cardiomediastinal silhouette with mild cardiomegaly. No pneumothorax. No pleural effusion. Hazy and reticular opacities throughout the mid to lower lungs bilaterally, not significantly changed. IMPRESSION: No appreciable change in hazy and reticular opacities throughout the mid to lower lungs bilaterally. Stable mild cardiomegaly. Findings could reflect any of pulmonary edema, atelectasis, pneumonia and/or chronic interstitial lung disease. Electronically Signed   By: Ilona Sorrel M.D.   On: 12/25/2018 11:48   Dg Chest Port 1 View  Result Date: 12/24/2018 CLINICAL DATA:  Vomiting, shortness of breath EXAM: PORTABLE CHEST 1 VIEW COMPARISON:  12/21/2018 FINDINGS: Stable cardiomegaly. Slightly improved aeration of the right upper lobe compared to previous chest x-ray. Patchy bibasilar opacities are similar to slightly progressed from prior. No large pleural fluid collection. No pneumothorax. IMPRESSION: 1. Patchy bibasilar opacities are similar to slightly progressed from prior. 2. Slightly improved aeration of the right upper lobe compared to prior. 3. Stable cardiomegaly. Electronically Signed   By: Davina Poke M.D.   On: 12/24/2018 14:03   Dg Chest Port 1 View  Result Date: 12/21/2018 CLINICAL DATA:  Cough and shortness of breath. Negative COVID-19 test. EXAM: PORTABLE CHEST 1 VIEW  COMPARISON:  PA and lateral chest 06/10/2015, 11/08/2016 and 11/10/2016. FINDINGS: There is right upper lobe and bibasilar airspace disease. No pneumothorax. There are likely small bilateral pleural effusions. Cardiomegaly and atherosclerosis are identified. No acute or focal bony abnormality. IMPRESSION: Right upper lobe and bibasilar airspace disease most worrisome for pneumonia. Cardiomegaly. Atherosclerosis. Electronically Signed   By: Inge Rise M.D.   On: 12/21/2018 11:38   Dg Abd Portable 1 View  Result Date: 12/21/2018 CLINICAL DATA:  Cough, shortness of breath and renal failure. EXAM: PORTABLE ABDOMEN - 1 VIEW COMPARISON:  CT abdomen and pelvis 11/08/2016. FINDINGS: The bowel gas pattern is normal. Moderately large stool ball in the rectosigmoid colon noted. No radio-opaque calculi or other significant radiographic abnormality are seen. The patient is status post cholecystectomy. Multilevel lumbar spondylosis is seen. IMPRESSION: No acute finding. Moderately large stool ball in the rectosigmoid colon. Electronically Signed   By: Inge Rise M.D.   On: 12/21/2018 11:39     Antimicrobials:   Cefepime, discontinue vancomycin   Subjective: Patient remains confused no significant change from yesterday Requiring restraints  Objective: Vitals:   12/27/18 1000 12/27/18 1300 12/27/18 1400 12/27/18 1430  BP:  (!) 119/39  (!) 161/92  Pulse:  65  61  Resp:  13  15  Temp: 97.8 F (36.6 C)  (!) 97.4 F (36.3 C)   TempSrc: Oral  Oral   SpO2:  95%  90%  Weight:      Height:        Intake/Output Summary (Last 24 hours) at 12/27/2018 1607 Last data filed at 12/27/2018 1423 Gross per 24 hour  Intake --  Output 1650 ml  Net -1650 ml   Filed Weights   12/24/18 0428 12/25/18 0500 12/26/18 0500  Weight: 89.1 kg 90.7 kg 87.5 kg    Examination:  General exam: Appears confused but calm Respiratory system: Rhonchi bilaterally, trace wheezing, no accessory muscle  use. Cardiovascular system: S1 & S2 heard, RRR. No JVD, murmurs, rubs, gallops or clicks. No pedal edema. Gastrointestinal system: Abdomen is nondistended, soft and nontender. No organomegaly or masses felt. Normal bowel sounds heard. Central nervous system: Alert . No focal neurological deficits. Extremities: Does move all 4 extremities freely, neurovascularly intact Skin: No rashes, lesions or ulcers Psychiatry: Judgement and insight are impaired. Mood & affect labile    Data Reviewed: I have personally reviewed following labs and imaging studies  CBC: Recent Labs  Lab 12/23/18 0552 12/24/18 0203 12/25/18 1116 12/26/18 0619 12/27/18 0233  WBC 14.7* 12.3* 9.7 9.1 10.5  NEUTROABS 12.3* 10.3* 7.0 6.0 7.0  HGB 11.5* 10.5* 10.8* 12.0* 11.7*  HCT 37.0* 34.4* 33.9* 38.9* 37.5*  MCV 97.1 97.2 93.9 95.6 95.4  PLT 175 195 PLATELET CLUMPS NOTED ON SMEAR, UNABLE TO ESTIMATE 230 [id number]   Basic Metabolic Panel: Recent Labs  Lab 12/23/18 0552 12/24/18 0203 12/25/18 1116 12/26/18 0257 12/27/18 0233  NA 144 143 143 143 143  K 4.4 3.9 4.7 3.6 3.1*  CL 109 113* 111 108 108  CO2 21* 20* 22 23 22   GLUCOSE 109* 111* 100* 99 97  BUN 52* 55* 47* 48* 43*  CREATININE 2.57* 2.35* 2.19* 2.42* 2.25*  CALCIUM 8.5* 8.2* 8.4* 8.6* 8.5*   GFR: Estimated Creatinine Clearance: 22 mL/min (A) (by C-G formula based on SCr of 2.25 mg/dL (H)). Liver Function Tests: Recent Labs  Lab 12/21/18 0930 12/22/18 0228  AST 24 54*  ALT 13 17  ALKPHOS 81 59  BILITOT 0.8 0.8  PROT 6.7 6.4*  ALBUMIN 3.5 3.1*   No results for input(s): LIPASE, AMYLASE in the last 168 hours. No results for input(s): AMMONIA in the last 168 hours. Coagulation Profile: No results for input(s): INR, PROTIME in the last 168 hours. Cardiac Enzymes: No results for input(s): CKTOTAL, CKMB, CKMBINDEX, TROPONINI in the last 168 hours. BNP (last 3 results) No results for input(s): PROBNP in the last 8760 hours. HbA1C: No results  for input(s): HGBA1C in the last 72 hours. CBG: Recent Labs  Lab 12/26/18 1556 12/26/18 2036 12/26/18 2327 12/27/18 1053 12/27/18 1418  GLUCAP 94 97 88 93 103*   Lipid Profile: No results for input(s): CHOL, HDL, LDLCALC, TRIG, CHOLHDL, LDLDIRECT in the last 72 hours. Thyroid Function Tests: No results for input(s): TSH, T4TOTAL, FREET4, T3FREE, THYROIDAB in the last 72 hours. Anemia Panel: No results for input(s): VITAMINB12, FOLATE, FERRITIN, TIBC, IRON, RETICCTPCT in the last 72 hours. Sepsis Labs: Recent Labs  Lab 12/21/18 0930 12/21/18 1114 12/21/18 1855 12/21/18 2107 12/23/18 0553  PROCALCITON <0.10  --   --   --   --   LATICACIDVEN 2.6* 3.9* 2.5* 3.1* 1.5    Recent Results (from the past 240 hour(s))  Urine culture     Status: None   Collection Time: 12/21/18  9:16 AM   Specimen: Urine, Catheterized  Result Value Ref Range Status   Specimen Description   Final    URINE, CATHETERIZED Performed at [facility], [place] [address], [place], [place] [address]    Special Requests   Final    NONE Performed at [facility], [place] [address], [place], [place] [address]    Culture   Final    NO GROWTH Performed at [facility]  [facility], [address], [place], [place] [address]    Report Status 12/22/2018 FINAL  Final  Blood Culture (routine x 2)     Status: None   Collection Time: 12/21/18  9:30 AM   Specimen: BLOOD  Result Value Ref Range Status   Specimen Description   Final    BLOOD RIGHT ANTECUBITAL Performed at [place] [address], [place], [place] [address]    Special Requests   Final    BOTTLES DRAWN AEROBIC AND ANAEROBIC Blood Culture adequate volume Performed at [place] [address], [place], [place] [address]    Culture   Final    NO GROWTH 5 DAYS Performed at [facility], [place] [address] [place],  [address]    Report Status 12/26/2018 FINAL  Final   SARS Coronavirus 2 by RT PCR (hospital order, performed in [facility] hospital lab) Nasopharyngeal Nasopharyngeal Swab     Status: None   Collection Time: 12/21/18  9:46 AM   Specimen: Nasopharyngeal Swab  Result Value Ref Range Status   SARS Coronavirus 2 NEGATIVE NEGATIVE Final    Comment: (NOTE) If result is NEGATIVE SARS-CoV-2 target nucleic acids are NOT DETECTED. The SARS-CoV-2 RNA is generally detectable in upper and lower  respiratory specimens during the acute phase of infection. The lowest  concentration of SARS-CoV-2 viral copies this assay can detect is 250  copies / mL. A negative result does not preclude SARS-CoV-2 infection  and should not be used as the sole basis for treatment or other  patient management decisions.  A negative result may occur with  improper specimen collection / handling, submission of specimen other  than nasopharyngeal swab, presence of viral mutation(s) within the  areas targeted by this assay, and inadequate number of viral copies  (<250 copies / mL). A negative result must be combined with clinical  observations, patient history, and epidemiological information. If result is POSITIVE SARS-CoV-2 target nucleic acids are DETECTED. The SARS-CoV-2 RNA is generally detectable in upper and lower  respiratory specimens dur ing the acute phase of infection.  Positive  results are indicative of active infection with SARS-CoV-2.  Clinical  correlation with patient history and other diagnostic information is  necessary to determine patient infection status.  Positive results do  not rule out bacterial infection or co-infection with other viruses. If result is PRESUMPTIVE POSTIVE SARS-CoV-2 nucleic acids MAY BE PRESENT.   A presumptive positive result was obtained on the submitted specimen  and confirmed on repeat testing.  While 2019 novel coronavirus  (SARS-CoV-2) nucleic acids may be present in the submitted sample  additional confirmatory testing  may be necessary for epidemiological  and / or clinical management purposes  to differentiate between  SARS-CoV-2 and other Sarbecovirus currently known to infect humans.  If clinically indicated additional testing with an alternate test  methodology [id number]) is advised. The SARS-CoV-2 RNA is generally  detectable in upper and lower respiratory sp ecimens during the acute  phase of infection. The expected result is Negative. Fact Sheet for Patients:  StrictlyIdeas.no Fact Sheet for Healthcare Providers: BankingDealers.co.za This test is not yet approved or cleared by the [place] FDA and has been authorized for detection and/or diagnosis of SARS-CoV-2 by FDA under an Emergency Use Authorization (EUA).  This EUA will remain in effect (meaning this test can be used) for the duration of the COVID-19 declaration under Section 564(b)(1) of the Act, 21 U.S.C. section 360bbb-3(b)(1), unless the authorization  is terminated or revoked sooner. Performed at [facility], [place] [address], [place], [place] [address]   Blood Culture (routine x 2)     Status: None   Collection Time: 12/21/18 10:00 AM   Specimen: BLOOD  Result Value Ref Range Status   Specimen Description   Final    BLOOD LEFT WRIST Performed at [place] [address] [place], [place] [address]    Special Requests   Final    BOTTLES DRAWN AEROBIC AND ANAEROBIC Blood Culture adequate volume Performed at [place] [address] [place], [place] [address]    Culture   Final    NO GROWTH 5 DAYS Performed at [facility], [place] [address], [place], [place] [address]    Report Status 12/26/2018 FINAL  Final  MRSA PCR Screening     Status: None   Collection Time: 12/21/18  5:00 PM   Specimen: Nasopharyngeal Swab  Result Value Ref Range Status   MRSA by PCR NEGATIVE NEGATIVE Final    Comment:        The  GeneXpert MRSA Assay (FDA approved for NASAL specimens only), is one component of a comprehensive MRSA colonization surveillance program. It is not intended to diagnose MRSA infection nor to guide or monitor treatment for MRSA infections. Performed at [facility], [place] [address] [place], [place] [address]   SARS CORONAVIRUS 2 (TAT 6-24 HRS) Nasopharyngeal Nasopharyngeal Swab     Status: None   Collection Time: 12/21/18  5:46 PM   Specimen: Nasopharyngeal Swab  Result Value Ref Range Status   SARS Coronavirus 2 NEGATIVE NEGATIVE Final    Comment: (NOTE) SARS-CoV-2 target nucleic acids are NOT DETECTED. The SARS-CoV-2 RNA is generally detectable in upper and lower respiratory specimens during the acute phase of infection. Negative results do not preclude SARS-CoV-2 infection, do not rule out co-infections with other pathogens, and should not be used as the sole basis for treatment or other patient management decisions. Negative results must be combined with clinical observations, patient history, and epidemiological information. The expected result is Negative. Fact Sheet for Patients: SugarRoll.be Fact Sheet for Healthcare Providers: https://www.woods-mathews.com/ This test is not yet approved or cleared by the [place] FDA and  has been authorized for detection and/or diagnosis of SARS-CoV-2 by FDA under an Emergency Use Authorization (EUA). This EUA will remain  in effect (meaning this test can be used) for the duration of the COVID-19 declaration under Section 56 4(b)(1) of the Act, 21 U.S.C. section 360bbb-3(b)(1), unless the authorization is terminated or revoked sooner. Performed at [facility], [place] [address] [place], [place] [address]          Radiology Studies: No results found.      Scheduled Meds:  amLODipine  10 mg Oral Daily   aspirin  81 mg Oral Daily   Chlorhexidine  Gluconate Cloth  6 each Topical Daily   heparin  5,000 Units Subcutaneous Q8H   hydrALAZINE  25 mg Oral Q8H   [START ON 12/28/2018] influenza vaccine adjuvanted  0.5 mL Intramuscular Tomorrow-1000   isosorbide mononitrate  60 mg Oral BID   levothyroxine  75 mcg Oral Daily   mouth rinse  15 mL Mouth Rinse BID   metoprolol succinate  50 mg Oral Daily   pravastatin  20 mg Oral q1800   QUEtiapine  25 mg Oral QHS   sertraline  50 mg Oral Daily   Continuous Infusions:  ceFEPime (MAXIPIME) IV Stopped (12/27/18 1458)   dextrose 50 mL/hr at 12/27/18 0530     LOS: 6 days    Time spent: [place], MD [facility]  If 7PM-7AM, please contact night-coverage  12/27/2018, 4:07 PM

## 2018-12-28 ENCOUNTER — Inpatient Hospital Stay (HOSPITAL_COMMUNITY): Payer: Medicare Other

## 2018-12-28 DIAGNOSIS — R52 Pain, unspecified: Secondary | ICD-10-CM

## 2018-12-28 DIAGNOSIS — Z515 Encounter for palliative care: Secondary | ICD-10-CM

## 2018-12-28 DIAGNOSIS — R451 Restlessness and agitation: Secondary | ICD-10-CM

## 2018-12-28 DIAGNOSIS — Z7189 Other specified counseling: Secondary | ICD-10-CM

## 2018-12-28 LAB — CBC WITH DIFFERENTIAL/PLATELET
Abs Immature Granulocytes: 0.26 10*3/uL — ABNORMAL HIGH (ref 0.00–0.07)
Basophils Absolute: 0.1 10*3/uL (ref 0.0–0.1)
Basophils Relative: 1 %
Eosinophils Absolute: 0.4 10*3/uL (ref 0.0–0.5)
Eosinophils Relative: 5 %
HCT: 37.8 % — ABNORMAL LOW (ref 39.0–52.0)
Hemoglobin: 11.7 g/dL — ABNORMAL LOW (ref 13.0–17.0)
Immature Granulocytes: 3 %
Lymphocytes Relative: 17 %
Lymphs Abs: 1.5 10*3/uL (ref 0.7–4.0)
MCH: 29.6 pg (ref 26.0–34.0)
MCHC: 31 g/dL (ref 30.0–36.0)
MCV: 95.7 fL (ref 80.0–100.0)
Monocytes Absolute: 1.1 10*3/uL — ABNORMAL HIGH (ref 0.1–1.0)
Monocytes Relative: 12 %
Neutro Abs: 5.6 10*3/uL (ref 1.7–7.7)
Neutrophils Relative %: 62 %
Platelets: 258 10*3/uL (ref 150–400)
RBC: 3.95 MIL/uL — ABNORMAL LOW (ref 4.22–5.81)
RDW: 14.6 % (ref 11.5–15.5)
WBC: 9 10*3/uL (ref 4.0–10.5)
nRBC: 0 % (ref 0.0–0.2)

## 2018-12-28 LAB — BASIC METABOLIC PANEL
Anion gap: 11 (ref 5–15)
BUN: 43 mg/dL — ABNORMAL HIGH (ref 8–23)
CO2: 24 mmol/L (ref 22–32)
Calcium: 8.5 mg/dL — ABNORMAL LOW (ref 8.9–10.3)
Chloride: 105 mmol/L (ref 98–111)
Creatinine, Ser: 2.35 mg/dL — ABNORMAL HIGH (ref 0.61–1.24)
GFR calc Af Amer: 26 mL/min — ABNORMAL LOW (ref >60)
GFR calc non Af Amer: 23 mL/min — ABNORMAL LOW (ref >60)
Glucose, Bld: 105 mg/dL — ABNORMAL HIGH (ref 70–99)
Potassium: 3.4 mmol/L — ABNORMAL LOW (ref 3.5–5.1)
Sodium: 140 mmol/L (ref 135–145)

## 2018-12-28 LAB — GLUCOSE, CAPILLARY
Glucose-Capillary: 105 mg/dL — ABNORMAL HIGH (ref 70–99)
Glucose-Capillary: 106 mg/dL — ABNORMAL HIGH (ref 70–99)
Glucose-Capillary: 111 mg/dL — ABNORMAL HIGH (ref 70–99)
Glucose-Capillary: 120 mg/dL — ABNORMAL HIGH (ref 70–99)

## 2018-12-28 MED ORDER — HYDROMORPHONE HCL 1 MG/ML IJ SOLN
1.0000 mg | INTRAMUSCULAR | Status: DC | PRN
  Administered 2018-12-28 – 2018-12-29 (×2): 1 mg via INTRAVENOUS
  Filled 2018-12-28 (×2): qty 1

## 2018-12-28 MED ORDER — GLYCOPYRROLATE 0.2 MG/ML IJ SOLN: 0.2000 mg | Freq: Four times a day (QID) | INTRAMUSCULAR | Status: DC | PRN

## 2018-12-28 NOTE — Progress Notes (Signed)
PROGRESS NOTE    Leonard Shepard  [id number] DOB: 03/06/1922 DOA: 12/21/2018 PCP: Hennie Duos, MD   Brief Narrative:  Leonard Shepard a 85 y.o.malewith medical history significant forCAD, pulmonary fibrosis on home O2 at 3L/min chronic PE no longer on coumadin due to severe GI bleed in 2018, HTN, HLD, solitary kidney s/p R nephrectomy, CKD stage IV who presents to the [place] of chills, projectile vomiting, worsening shortness of breath,with sats in the 104s. Patient has history of aspiration pneumonia multiple times in the past. He is usually independent, alert and oriented at baseline, ambulates of the with help of walker and lives with his son. He son tested positive for Covid-19 about 10 days ago but his son says that he isolated himself pretty well. Covid-19 test negative twice. CT chest showed right-sided and bibasilar pneumonia on the background of his interstitial lung disease. Patient started on IV antibiotics for aspiration pneumonia. Respiratory status has not improved significantly. Palliative care also following for goals of care. Family slowly leaning towards comfort care but atill remian undecided   Assessment & Plan:   Principal Problem:   Sepsis ([place]) Active Problems:   Chronic pulmonary embolism ([place])   Aspiration pneumonia ([place])   Acquired hypothyroidism   History of adrenal insufficiency   Postinflammatory pulmonary fibrosis ([place])   H/O unilateral nephrectomy   Acute renal failure superimposed on stage 4 chronic kidney disease (HCC)   Acute on chronic hypoxic respiratory failure: Secondary to pneumonia. Has history of recurrent aspiration pneumonia in the past. Has history of pulmonary fibrosis and he is on 3 L of oxygen at home. Currently STILL requiring high flow oxygen.We will continue to try to wean.Chest x-ray 10/24  showedpatchy bibasilar opacities are similar to slightly progressed from prior.receiveda dose of Lasix 80 mg  10/25,.repeat cxr in AM 10/27 showed no significant change. Pt not making significant progress but family having a difficult time deciding on comfort care.  Discussed with daughter again today extensively with poor prognosis, she wants to speak to palliative care again today  Aspiration pneumonia:No changes,CT chest showed bibasilar pneumonia. Chest x-ray showed right-sided pneumonia. Continue broad-spectrum antibiotics for now. He has history of aspiration pneumonia in the past and was admitted. He has history of esophageal dysmotility as per son. He was nauseated and vomiting at home. Currently afebrile. X-ray as above unchanged  Altered mental status:Present on admission.Secondary to altered metabolic encephalopathy due to pneumonia, hypoxia. appears to be waxign and waning  still not a significant change over the last several dayson his baseline, he is alert and oriented as per son.  Suspicion for Covid-19:Test negative twice. Son was diagnosed with Covid about 10 days ago. Patient lives with son. As per son, he had isolated himself. Continue airborne precaution as per ID.  Elevated troponin:Has history of coronary disease. Deniedany chest pain presentation. Troponin peaked to 1900s. EKG did not show any acute ST changes. Recent echocardiogram showed ejection fraction of 60-65%, no wall motion abnormality. Cardiology was consulted,no changes-suspected demand ischemia, no further work-up recommended.  Hypertensive urgency: Severely hypertensive on presentation.Blood pressure fluctuates significantly,most recent 94/63,Continue current medicines.Continue as needed medicines.  Elevated D-dimer: Could be multifactorial. History of PE and was on Coumadin in the past but he stopped due to GI bleed.   History of adrenal insufficiency:On Florinef and steroid at home.I willcontinue tohold these due to hypertension.  CKD stage IV/history of right nephrectomy:  Currently kidney function is at baseline.  Constipation:Continue bowel regimen.  Deconditioning/debility/advanced age, multiple comorbidities:  Have requested for palliative care consultation. If his respiratory status does not improve or if he declines further, he is a candidate for hospice. Family understandsthe prognosis. They are ready to lean towards comfort care if he does not improve.  DVT prophylaxis: Heparin SQ  Code Status: DNR    Code Status Orders  (From admission, onward)         Start     Ordered   12/21/18 1445  Do not attempt resuscitation (DNR)  Continuous    Question Answer Comment  In the event of cardiac or respiratory ARREST Do not call a code blue   In the event of cardiac or respiratory ARREST Do not perform Intubation, CPR, defibrillation or ACLS   In the event of cardiac or respiratory ARREST Use medication by any route, position, wound care, and other measures to relive pain and suffering. May use oxygen, suction and manual treatment of airway obstruction as needed for comfort.      12/21/18 1444        Code Status History    Date Active Date Inactive Code Status Order ID Comments User Context   11/20/2016 0857 11/23/2016 2121 Full Code [id number]  Reyne Dumas, MD ED   11/08/2016 1230 11/12/2016 2036 Full Code [id number]  Lorella Nimrod, MD ED   06/11/2015 0415 06/12/2015 1655 Full Code [id number]  Ivor Costa, MD ED   Advance Care Planning Activity     Family Communication: Discussed with daughter who was bedside today Disposition Plan:    Patient remained inpatient for continued IV antibiotics and respiratory support.  Patient is a poor prognosis family considering comfort care and will discuss again with palliative care today.  Not stable for medical discharge  Consults called: None Admission status: Inpatient   Consultants:   Cardiology and palliative  Procedures:  Ct Abdomen Pelvis Wo Contrast  Result Date: 12/21/2018 CLINICAL DATA:  Nausea  vomiting since yesterday, confusion. Limited evaluation due to patient condition. EXAM: CT ABDOMEN AND PELVIS WITHOUT CONTRAST TECHNIQUE: Multidetector CT imaging of the abdomen and pelvis was performed following the standard protocol without IV contrast. COMPARISON:  11/08/2016 FINDINGS: Lower chest: Superimposed airspace disease is present upon a background of interstitial lung disease. To a lesser extent similar changes are seen in the medial left lung base. No signs of pleural effusion. Extensive coronary artery calcifications and evidence of aortic atherosclerosis with stable dilation of the descending thoracic aorta to 3.6 cm. Heart size is enlarged without pericardial effusion. Central pulmonary vasculature is engorged at 3.8 cm. Hepatobiliary: Lobular hepatic contours. No signs of focal lesion. Post cholecystectomy. No signs of biliary ductal dilation. Pancreas: Unremarkable. No pancreatic ductal dilatation or surrounding inflammatory changes. Spleen: Normal in size without focal abnormality. Adrenals/Urinary Tract: Adrenal glands are normal. Post right nephrectomy. Renal cortical scarring noted on the left. No signs of left-sided hydronephrosis or perinephric fluid. Stomach/Bowel: No signs of bowel obstruction. Large volume stool is however present in the rectum but with similar appearance to prior exam. Colonic diverticulosis. Large duodenal diverticulum unchanged. Vascular/Lymphatic: Marked vascular disease with moderate to marked dilation of the infrarenal abdominal aorta with fusiform morphology measuring 4.4 x 4.3 cm compared to 3.8 x 3.8 cm in 2018. No periaortic stranding or hematoma. No signs of adenopathy in the retroperitoneum or in the upper abdomen. No signs of pelvic lymphadenopathy. Reproductive: Foley catheter in situ, circumferential thickening of the urinary bladder, nonspecific finding. Other: No signs of free air. No ascites. Musculoskeletal: Extensive spinal degenerative changes are  similar to the previous exam. Skis be no acute bone finding or destructive bone process. IMPRESSION: Pneumonia superimposed on chronic changes of interstitial lung disease. Aspiration is also considered given history of vomiting. Marked vascular disease with stable dilation of the descending thoracic aorta to 3.6 cm. Infrarenal abdominal aortic aneurysm measuring 4.4 x 4.3 cm compared to 3.8 x 3.8 cm in 2018. Recommend followup by ultrasound in 1 year. This recommendation follows ACR consensus guidelines: White Paper of the ACR Incidental Findings Committee II on Vascular Findings. J Am Coll Radiol 2013; 10:789-794. Aortic aneurysm NOS (ICD10-I71.9) Coronary artery disease and signs of pulmonary arterial hypertension. Aortic Atherosclerosis (ICD10-I70.0). Electronically Signed   By: Zetta Bills M.D.   On: 12/21/2018 15:32   Dg Chest Port 1 View  Result Date: 12/28/2018 CLINICAL DATA:  Pneumonia. EXAM: PORTABLE CHEST 1 VIEW COMPARISON:  December 25, 2018. FINDINGS: Stable cardiomegaly. No pneumothorax or pleural effusion is noted. Stable bibasilar opacities are noted concerning for edema or atelectasis or infiltrate. Bony thorax unremarkable. IMPRESSION: Stable bibasilar opacities as described above. Electronically Signed   By: Marijo Conception M.D.   On: 12/28/2018 07:37   Dg Chest Port 1 View  Result Date: 12/25/2018 CLINICAL DATA:  Dyspnea EXAM: PORTABLE CHEST 1 VIEW COMPARISON:  Chest radiograph from one day prior. FINDINGS: Stable cardiomediastinal silhouette with mild cardiomegaly. No pneumothorax. No pleural effusion. Hazy and reticular opacities throughout the mid to lower lungs bilaterally, not significantly changed. IMPRESSION: No appreciable change in hazy and reticular opacities throughout the mid to lower lungs bilaterally. Stable mild cardiomegaly. Findings could reflect any of pulmonary edema, atelectasis, pneumonia and/or chronic interstitial lung disease. Electronically Signed   By: Ilona Sorrel M.D.   On: 12/25/2018 11:48   Dg Chest Port 1 View  Result Date: 12/24/2018 CLINICAL DATA:  Vomiting, shortness of breath EXAM: PORTABLE CHEST 1 VIEW COMPARISON:  12/21/2018 FINDINGS: Stable cardiomegaly. Slightly improved aeration of the right upper lobe compared to previous chest x-ray. Patchy bibasilar opacities are similar to slightly progressed from prior. No large pleural fluid collection. No pneumothorax. IMPRESSION: 1. Patchy bibasilar opacities are similar to slightly progressed from prior. 2. Slightly improved aeration of the right upper lobe compared to prior. 3. Stable cardiomegaly. Electronically Signed   By: Davina Poke M.D.   On: 12/24/2018 14:03   Dg Chest Port 1 View  Result Date: 12/21/2018 CLINICAL DATA:  Cough and shortness of breath. Negative COVID-19 test. EXAM: PORTABLE CHEST 1 VIEW COMPARISON:  PA and lateral chest 06/10/2015, 11/08/2016 and 11/10/2016. FINDINGS: There is right upper lobe and bibasilar airspace disease. No pneumothorax. There are likely small bilateral pleural effusions. Cardiomegaly and atherosclerosis are identified. No acute or focal bony abnormality. IMPRESSION: Right upper lobe and bibasilar airspace disease most worrisome for pneumonia. Cardiomegaly. Atherosclerosis. Electronically Signed   By: Inge Rise M.D.   On: 12/21/2018 11:38   Dg Abd Portable 1 View  Result Date: 12/21/2018 CLINICAL DATA:  Cough, shortness of breath and renal failure. EXAM: PORTABLE ABDOMEN - 1 VIEW COMPARISON:  CT abdomen and pelvis 11/08/2016. FINDINGS: The bowel gas pattern is normal. Moderately large stool ball in the rectosigmoid colon noted. No radio-opaque calculi or other significant radiographic abnormality are seen. The patient is status post cholecystectomy. Multilevel lumbar spondylosis is seen. IMPRESSION: No acute finding. Moderately large stool ball in the rectosigmoid colon. Electronically Signed   By: Inge Rise M.D.   On: 12/21/2018 11:39       Antimicrobials:  Cefepime   Subjective: Patient still remains confused requiring restraints Little change over the last 48 to 72 hours Daughter at bedside-discussed possible comfort care options  Objective: Vitals:   12/28/18 0900 12/28/18 1125 12/28/18 1140 12/28/18 1200  BP: (!) 200/181 (!) 101/34  94/63  Pulse: (!) 59 62  (!) 51  Resp: 18 20  16   Temp:   97.6 F (36.4 C)   TempSrc:   Axillary   SpO2: 100% 100%  99%  Weight:      Height:        Intake/Output Summary (Last 24 hours) at 12/28/2018 1242 Last data filed at 12/28/2018 0600 Gross per 24 hour  Intake 1943.23 ml  Output 800 ml  Net 1143.23 ml   Filed Weights   12/25/18 0500 12/26/18 0500 12/28/18 0452  Weight: 90.7 kg 87.5 kg 85.7 kg    Examination:  General exam: Appears calm but confused Respiratory system: Rales bilaterally, rhonchi bilaterally,  cardiovascular system: S1 & S2 heard, RRR. No JVD, murmurs, rubs, gallops or clicks. No pedal edema. Gastrointestinal system: Abdomen is nondistended, soft and nontender by exam. No organomegaly or masses felt. Normal bowel sounds heard. Central nervous system: Alert confused no focal neurological deficits. Extremities: Warm well perfused, neurovascularly intact. Skin: No rashes, lesions or ulcers Psychiatry: Judgement and insight impaired. Mood & affect labile    Data Reviewed: I have personally reviewed following labs and imaging studies  CBC: Recent Labs  Lab 12/24/18 0203 12/25/18 1116 12/26/18 0619 12/27/18 0233 12/28/18 0150  WBC 12.3* 9.7 9.1 10.5 9.0  NEUTROABS 10.3* 7.0 6.0 7.0 5.6  HGB 10.5* 10.8* 12.0* 11.7* 11.7*  HCT 34.4* 33.9* 38.9* 37.5* 37.8*  MCV 97.2 93.9 95.6 95.4 95.7  PLT 195 PLATELET CLUMPS NOTED ON SMEAR, UNABLE TO ESTIMATE 230 239 0000000   Basic Metabolic Panel: Recent Labs  Lab 12/24/18 0203 12/25/18 1116 12/26/18 0257 12/27/18 0233 12/28/18 0150  NA 143 143 143 143 140  K 3.9 4.7 3.6 3.1* 3.4*  CL 113*  111 108 108 105  CO2 20* 22 23 22 24   GLUCOSE 111* 100* 99 97 105*  BUN 55* 47* 48* 43* 43*  CREATININE 2.35* 2.19* 2.42* 2.25* 2.35*  CALCIUM 8.2* 8.4* 8.6* 8.5* 8.5*   GFR: Estimated Creatinine Clearance: 21.1 mL/min (A) (by C-G formula based on SCr of 2.35 mg/dL (H)). Liver Function Tests: Recent Labs  Lab 12/22/18 0228  AST 54*  ALT 17  ALKPHOS 59  BILITOT 0.8  PROT 6.4*  ALBUMIN 3.1*   No results for input(s): LIPASE, AMYLASE in the last 168 hours. No results for input(s): AMMONIA in the last 168 hours. Coagulation Profile: No results for input(s): INR, PROTIME in the last 168 hours. Cardiac Enzymes: No results for input(s): CKTOTAL, CKMB, CKMBINDEX, TROPONINI in the last 168 hours. BNP (last 3 results) No results for input(s): PROBNP in the last 8760 hours. HbA1C: No results for input(s): HGBA1C in the last 72 hours. CBG: Recent Labs  Lab 12/27/18 2022 12/28/18 0034 12/28/18 0446 12/28/18 0725 12/28/18 1125  GLUCAP 99 111* 105* 106* 120*   Lipid Profile: No results for input(s): CHOL, HDL, LDLCALC, TRIG, CHOLHDL, LDLDIRECT in the last 72 hours. Thyroid Function Tests: No results for input(s): TSH, T4TOTAL, FREET4, T3FREE, THYROIDAB in the last 72 hours. Anemia Panel: No results for input(s): VITAMINB12, FOLATE, FERRITIN, TIBC, IRON, RETICCTPCT in the last 72 hours. Sepsis Labs: Recent Labs  Lab 12/21/18 1855 12/21/18 2107 12/23/18 0553  LATICACIDVEN 2.5* 3.1* 1.5  Recent Results (from the past 240 hour(s))  Urine culture     Status: None   Collection Time: 12/21/18  9:16 AM   Specimen: Urine, Catheterized  Result Value Ref Range Status   Specimen Description   Final    URINE, CATHETERIZED Performed at [facility] [address] [place], [place] [address]    Special Requests   Final    NONE Performed at [facility], [place] [address], [place], [place] [address]    Culture   Final    NO GROWTH Performed  at [facility], [place] [address] [place], [place] [address]    Report Status 12/22/2018 FINAL  Final  Blood Culture (routine x 2)     Status: None   Collection Time: 12/21/18  9:30 AM   Specimen: BLOOD  Result Value Ref Range Status   Specimen Description   Final    BLOOD RIGHT ANTECUBITAL Performed at [facility] [address], [place], [place] [address]    Special Requests   Final    BOTTLES DRAWN AEROBIC AND ANAEROBIC Blood Culture adequate volume Performed at [facility] [address], [place], [place] [address]    Culture   Final    NO GROWTH 5 DAYS Performed at [facility], [place] [address], [place], [place] [address]    Report Status 12/26/2018 FINAL  Final  SARS Coronavirus 2 by RT PCR (hospital order, performed in [facility] hospital lab) Nasopharyngeal Nasopharyngeal Swab     Status: None   Collection Time: 12/21/18  9:46 AM   Specimen: Nasopharyngeal Swab  Result Value Ref Range Status   SARS Coronavirus 2 NEGATIVE NEGATIVE Final    Comment: (NOTE) If result is NEGATIVE SARS-CoV-2 target nucleic acids are NOT DETECTED. The SARS-CoV-2 RNA is generally detectable in upper and lower  respiratory specimens during the acute phase of infection. The lowest  concentration of SARS-CoV-2 viral copies this assay can detect is 250  copies / mL. A negative result does not preclude SARS-CoV-2 infection  and should not be used as the sole basis for treatment or other  patient management decisions.  A negative result may occur with  improper specimen collection / handling, submission of specimen other  than nasopharyngeal swab, presence of viral mutation(s) within the  areas targeted by this assay, and inadequate number of viral copies  (<250 copies / mL). A negative result must be combined with clinical  observations, patient history, and epidemiological information. If result is POSITIVE SARS-CoV-2 target nucleic acids are  DETECTED. The SARS-CoV-2 RNA is generally detectable in upper and lower  respiratory specimens dur ing the acute phase of infection.  Positive  results are indicative of active infection with SARS-CoV-2.  Clinical  correlation with patient history and other diagnostic information is  necessary to determine patient infection status.  Positive results do  not rule out bacterial infection or co-infection with other viruses. If result is PRESUMPTIVE POSTIVE SARS-CoV-2 nucleic acids MAY BE PRESENT.   A presumptive positive result was obtained on the submitted specimen  and confirmed on repeat testing.  While 2019 novel coronavirus  (SARS-CoV-2) nucleic acids may be present in the submitted sample  additional confirmatory testing may be necessary for epidemiological  and / or clinical management purposes  to differentiate between  SARS-CoV-2 and other Sarbecovirus currently known to infect humans.  If clinically indicated additional testing with an alternate test  methodology [phone]) is advised.  The SARS-CoV-2 RNA is generally  detectable in upper and lower respiratory sp ecimens during the acute  phase of infection. The expected result is Negative. Fact Sheet for Patients:  StrictlyIdeas.no Fact Sheet for Healthcare Providers: BankingDealers.co.za This test is not yet approved or cleared by the [place] FDA and has been authorized for detection and/or diagnosis of SARS-CoV-2 by FDA under an Emergency Use Authorization (EUA).  This EUA will remain in effect (meaning this test can be used) for the duration of the COVID-19 declaration under Section 564(b)(1) of the Act, 21 U.S.C. section 360bbb-3(b)(1), unless the authorization is terminated or revoked sooner. Performed at [facility], [place] [address], [place], [place] [address]   Blood Culture (routine x 2)     Status: None   Collection Time: 12/21/18 10:00 AM    Specimen: BLOOD  Result Value Ref Range Status   Specimen Description   Final    BLOOD LEFT WRIST Performed at [facility] [address] [place], [place] [address]    Special Requests   Final    BOTTLES DRAWN AEROBIC AND ANAEROBIC Blood Culture adequate volume Performed at [facility] [address] [place], [place] [address]    Culture   Final    NO GROWTH 5 DAYS Performed at [facility], [place] [address], [place], [place] [address]    Report Status 12/26/2018 FINAL  Final  MRSA PCR Screening     Status: None   Collection Time: 12/21/18  5:00 PM   Specimen: Nasopharyngeal Swab  Result Value Ref Range Status   MRSA by PCR NEGATIVE NEGATIVE Final    Comment:        The GeneXpert MRSA Assay (FDA approved for NASAL specimens only), is one component of a comprehensive MRSA colonization surveillance program. It is not intended to diagnose MRSA infection nor to guide or monitor treatment for MRSA infections. Performed at [facility], [place] [address] [place], [place] [address]   SARS CORONAVIRUS 2 (TAT 6-24 HRS) Nasopharyngeal Nasopharyngeal Swab     Status: None   Collection Time: 12/21/18  5:46 PM   Specimen: Nasopharyngeal Swab  Result Value Ref Range Status   SARS Coronavirus 2 NEGATIVE NEGATIVE Final    Comment: (NOTE) SARS-CoV-2 target nucleic acids are NOT DETECTED. The SARS-CoV-2 RNA is generally detectable in upper and lower respiratory specimens during the acute phase of infection. Negative results do not preclude SARS-CoV-2 infection, do not rule out co-infections with other pathogens, and should not be used as the sole basis for treatment or other patient management decisions. Negative results must be combined with clinical observations, patient history, and epidemiological information. The expected result is Negative. Fact Sheet for Patients: SugarRoll.be Fact  Sheet for Healthcare Providers: https://www.woods-mathews.com/ This test is not yet approved or cleared by the [place] FDA and  has been authorized for detection and/or diagnosis of SARS-CoV-2 by FDA under an Emergency Use Authorization (EUA). This EUA will remain  in effect (meaning this test can be used) for the duration of the COVID-19 declaration under Section 56 4(b)(1) of the Act, 21 U.S.C. section 360bbb-3(b)(1), unless the authorization is terminated or revoked sooner. Performed at [facility], [place] [address] [place],  [address]          Radiology Studies: Dg Chest Port 1 View  Result Date: 12/28/2018 CLINICAL DATA:  Pneumonia. EXAM: PORTABLE CHEST 1 VIEW COMPARISON:  December 25, 2018. FINDINGS: Stable cardiomegaly.  No pneumothorax or pleural effusion is noted. Stable bibasilar opacities are noted concerning for edema or atelectasis or infiltrate. Bony thorax unremarkable. IMPRESSION: Stable bibasilar opacities as described above. Electronically Signed   By: Marijo Conception M.D.   On: 12/28/2018 07:37        Scheduled Meds:  amLODipine  10 mg Oral Daily   aspirin  81 mg Oral Daily   Chlorhexidine Gluconate Cloth  6 each Topical Daily   heparin  5,000 Units Subcutaneous Q8H   hydrALAZINE  25 mg Oral Q8H   influenza vaccine adjuvanted  0.5 mL Intramuscular Tomorrow-1000   isosorbide mononitrate  60 mg Oral BID   levothyroxine  75 mcg Oral Daily   mouth rinse  15 mL Mouth Rinse BID   metoprolol succinate  50 mg Oral Daily   pravastatin  20 mg Oral q1800   QUEtiapine  25 mg Oral QHS   sertraline  50 mg Oral Daily   Continuous Infusions:  ceFEPime (MAXIPIME) IV Stopped (12/27/18 1453)   dextrose 50 mL/hr at 12/28/18 0600     LOS: 7 days    Time spent: 35 min    Nicolette Bang, MD [facility]  If 7PM-7AM, please contact night-coverage  12/28/2018, 12:42 PM

## 2018-12-28 NOTE — TOC Initial Note (Signed)
Transition of Care [facility]) - Initial/Assessment Note    Patient Details  Name: Leonard Shepard MRN: [id number] Date of Birth: 16-Jul-1922  Transition of Care [facility]) CM/SW Contact:    Bron Snellings, Marjie Skiff, RN Phone Number:[phone] 12/28/2018, 1:28 PM  Clinical Narrative:                  TOC consult for residential hospice. This CM left message with daughter Ivin Booty for return call about residential hospice services.     Activities of Daily Living Home Assistive Devices/Equipment: Oxygen, Walker (specify type)(front wheeled walker) ADL Screening (condition at time of admission) Patient's cognitive ability adequate to safely complete daily activities?: Yes Is the patient deaf or have difficulty hearing?: No Does the patient have difficulty seeing, even when wearing glasses/contacts?: No Does the patient have difficulty concentrating, remembering, or making decisions?: No Patient able to express need for assistance with ADLs?: Yes Does the patient have difficulty dressing or bathing?: Yes Independently performs ADLs?: No Communication: Independent Dressing (OT): Needs assistance Is this a change from baseline?: Pre-admission baseline Grooming: Needs assistance Is this a change from baseline?: Pre-admission baseline Feeding: Needs assistance Is this a change from baseline?: Pre-admission baseline Bathing: Needs assistance Is this a change from baseline?: Pre-admission baseline Toileting: Needs assistance Is this a change from baseline?: Pre-admission baseline In/Out Bed: Needs assistance Is this a change from baseline?: Pre-admission baseline Walks in Home: Needs assistance Is this a change from baseline?: Pre-admission baseline Does the patient have difficulty walking or climbing stairs?: Yes Weakness of Legs: Both Weakness of Arms/Hands: None  Permission Sought/Granted                  Emotional Assessment              Admission diagnosis:  Acute urinary retention  [R33.8] Severe sepsis ([place]) [A41.9, R65.20] Community acquired pneumonia of right upper lobe of lung [J18.9] Patient Active Problem List   Diagnosis Date Noted  . Generalized pain   . Agitation   . Palliative care by specialist   . Goals of care, counseling/discussion   . Dying care   . Supratherapeutic INR 12/09/2016  . Hypoxia 12/09/2016  . HX: anticoagulation   . Anemia due to acute blood loss   . Diverticulitis of colon with bleeding   . Coagulopathy ([place])   . Lower GI bleed 11/20/2016  . Dysphasia 11/16/2016  . Acute renal failure superimposed on stage 4 chronic kidney disease ([place]) 11/16/2016  . Adrenal insufficiency due to steroid withdrawal ([place]) 11/16/2016  . Hypertension 11/16/2016  . Abdominal aortic atherosclerosis ([place]) 11/10/2016  . Oropharyngeal dysphagia 11/10/2016  . Hemoptysis   . Chronic anticoagulation   . S/P coronary artery stent placement   . Acquired hypothyroidism   . History of adrenal insufficiency   . Postinflammatory pulmonary fibrosis ([place])   . H/O unilateral nephrectomy   . Hx pulmonary embolism   . Aspiration pneumonia ([place]) 11/08/2016  . Orthostasis 06/11/2015  . Sepsis ([place]) 06/11/2015  . Nausea & vomiting 06/11/2015  . Abdominal pain 06/11/2015  . HLD (hyperlipidemia)   . GERD (gastroesophageal reflux disease)   . CAD (coronary artery disease)   . Chronic pulmonary embolism ([place])   . BPH (benign prostatic hyperplasia)   . Chronic renal insufficiency, stage III (moderate)   . COPD (chronic obstructive pulmonary disease) ([place])   . Depression    PCP:  Hennie Duos, MD Pharmacy:   [id number] [place], [place] - [address]  AT [place] [address] [place] [address] Phone: [phone] Fax: [phone]  [place], [place] [address] [place] [address] Phone: [phone] Fax: [phone]     Social Determinants of Health (SDOH)  Interventions    Readmission Risk Interventions Readmission Risk Prevention Plan 12/27/2018  Transportation Screening Complete  PCP or Specialist Appt within 3-5 Days Not Complete  Not Complete comments not yet ready to dc  HRI or [place] Complete  Social Work Consult for [place] Planning/Counseling Complete  Palliative Care Screening Complete  Medication Review Press photographer) Complete  Some recent data might be hidden

## 2018-12-28 NOTE — Progress Notes (Signed)
Daily Progress Note   Patient Name: Leonard Shepard       Date: 12/28/2018 DOB: December 09, 1922  Age: 85 y.o. MRN#: [id number] Attending Physician: Marcell Anger* Primary Care Physician: Hennie Duos, MD Admit Date: 12/21/2018  Reason for Consultation/Follow-up: Establishing goals of care  Subjective: Continues requiring restraints due to confusion.  Daughter Ivin Booty who is here from [place] now at the bedside. Discussed with her in detail, outside the patient's room. See below.       Length of Stay: 7  Current Medications: Scheduled Meds:  . Chlorhexidine Gluconate Cloth  6 each Topical Daily  . hydrALAZINE  25 mg Oral Q8H  . influenza vaccine adjuvanted  0.5 mL Intramuscular Tomorrow-1000  . isosorbide mononitrate  60 mg Oral BID  . levothyroxine  75 mcg Oral Daily  . mouth rinse  15 mL Mouth Rinse BID  . metoprolol succinate  50 mg Oral Daily  . QUEtiapine  25 mg Oral QHS  . sertraline  50 mg Oral Daily    Continuous Infusions: . ceFEPime (MAXIPIME) IV Stopped (12/27/18 1453)  . dextrose 50 mL/hr at 12/28/18 0600    PRN Meds: acetaminophen **OR** acetaminophen, albuterol, glycopyrrolate, haloperidol lactate, HYDROcodone-acetaminophen, hydrocortisone, HYDROmorphone (DILAUDID) injection, labetalol, Melatonin, nitroGLYCERIN, ondansetron, promethazine, Resource ThickenUp Clear  Physical Exam         General: confused  Heart: Regular rate and rhythm. No murmur appreciated. Lungs: Decreased Abdomen: Soft, nontender, nondistended, positive bowel sounds.  Ext: No significant edema Skin: Warm and dry  Vital Signs: BP 94/63   Pulse (!) 51   Temp 97.6 F (36.4 C) (Axillary)   Resp 16   Ht 6' (1.829 m)   Wt 85.7 kg   SpO2 99%   BMI 25.62 kg/m  SpO2: SpO2:  99 % O2 Device: O2 Device: NRB O2 Flow Rate: O2 Flow Rate (L/min): 15 L/min  Intake/output summary:   Intake/Output Summary (Last 24 hours) at 12/28/2018 1304 Last data filed at 12/28/2018 0600 Gross per 24 hour  Intake 1943.23 ml  Output 800 ml  Net 1143.23 ml   LBM: Last BM Date: 12/27/18 Baseline Weight: Weight: 93.4 kg Most recent weight: Weight: 85.7 kg       Palliative Assessment/Data:      Patient Active Problem List   Diagnosis Date Noted  . Supratherapeutic  INR 12/09/2016  . Hypoxia 12/09/2016  . HX: anticoagulation   . Anemia due to acute blood loss   . Diverticulitis of colon with bleeding   . Coagulopathy ([place])   . Lower GI bleed 11/20/2016  . Dysphasia 11/16/2016  . Acute renal failure superimposed on stage 4 chronic kidney disease ([place]) 11/16/2016  . Adrenal insufficiency due to steroid withdrawal ([place]) 11/16/2016  . Hypertension 11/16/2016  . Abdominal aortic atherosclerosis ([place]) 11/10/2016  . Oropharyngeal dysphagia 11/10/2016  . Hemoptysis   . Chronic anticoagulation   . S/P coronary artery stent placement   . Acquired hypothyroidism   . History of adrenal insufficiency   . Postinflammatory pulmonary fibrosis ([place])   . H/O unilateral nephrectomy   . Hx pulmonary embolism   . Aspiration pneumonia ([place]) 11/08/2016  . Orthostasis 06/11/2015  . Sepsis ([place]) 06/11/2015  . Nausea & vomiting 06/11/2015  . Abdominal pain 06/11/2015  . HLD (hyperlipidemia)   . GERD (gastroesophageal reflux disease)   . CAD (coronary artery disease)   . Chronic pulmonary embolism ([place])   . BPH (benign prostatic hyperplasia)   . Chronic renal insufficiency, stage III (moderate)   . COPD (chronic obstructive pulmonary disease) ([place])   . Depression     Palliative Care Assessment & Plan   Patient Profile: 85 year old male with recurrent aspiration pneumonias admitted with hypoxic respiratory failure related to aspiration pneumonia  Assessment: Patient Active  Problem List   Diagnosis Date Noted  . Supratherapeutic INR 12/09/2016  . Hypoxia 12/09/2016  . HX: anticoagulation   . Anemia due to acute blood loss   . Diverticulitis of colon with bleeding   . Coagulopathy ([place])   . Lower GI bleed 11/20/2016  . Dysphasia 11/16/2016  . Acute renal failure superimposed on stage 4 chronic kidney disease ([place]) 11/16/2016  . Adrenal insufficiency due to steroid withdrawal ([place]) 11/16/2016  . Hypertension 11/16/2016  . Abdominal aortic atherosclerosis ([place]) 11/10/2016  . Oropharyngeal dysphagia 11/10/2016  . Hemoptysis   . Chronic anticoagulation   . S/P coronary artery stent placement   . Acquired hypothyroidism   . History of adrenal insufficiency   . Postinflammatory pulmonary fibrosis ([place])   . H/O unilateral nephrectomy   . Hx pulmonary embolism   . Aspiration pneumonia ([place]) 11/08/2016  . Orthostasis 06/11/2015  . Sepsis ([place]) 06/11/2015  . Nausea & vomiting 06/11/2015  . Abdominal pain 06/11/2015  . HLD (hyperlipidemia)   . GERD (gastroesophageal reflux disease)   . CAD (coronary artery disease)   . Chronic pulmonary embolism ([place])   . BPH (benign prostatic hyperplasia)   . Chronic renal insufficiency, stage III (moderate)   . COPD (chronic obstructive pulmonary disease) ([place])   . Depression    Recommendations/Plan:  Family meeting: Discussed with bedside RN, arrived on unit, found daughter in waiting room. Palliative medicine is specialized medical care for people living with serious illness. It focuses on providing relief from the symptoms and stress of a serious illness. The goal is to improve quality of life for both the patient and the family.  Goals of care: Broad aims of medical therapy in relation to the patient's values and preferences. Our aim is to provide medical care aimed at enabling patients to achieve the goals that matter most to them, given the circumstances of their particular medical situation and their constraints.    Brief life review performed: patient is originally from [place], has been living with son and daughter in law in [place] for the past  3 and a half years. He had a serious bout of Pneumonia 2 years ago, he did not have a good rehab facility  experience at that time. He had CKD and hadn't wanted to consider dialysis. He was able to still function somewhat independently and manage his ADLs at home, how ever, was having functional decline in the past few months.   Daughter is tearful as she states that she has discussed with her brother in detail about the patient's current condition. She is thankful that maximum medical efforts have been employed, however is distraught that the patient is not getting better. He requires restraints, not eating, continues to show signs of decline.   We talked about comfort measures in detail. Aggressive symptom management was discussed. Discontinuing labs/medications not important for comfort also discussed.   We then talked about hospice philosophy of care, specifically residential hospice.   PLAN: Start transitioning to comfort measures Ok to keep antibiotics on for now, possibly as a comfort measure. D.C labs and medications not contributing to comfort.  Agree with Haldol and Dilaudid. CSW consulted to help facilitate transfer to residential hospice, as daughter Ivin Booty is trying to get the patient's son to also come visit with him.  Prognosis likely days to less than 2 weeks discussed with daughter.    Code Status:    Code Status Orders  (From admission, onward)         Start     Ordered   12/21/18 1445  Do not attempt resuscitation (DNR)  Continuous    Question Answer Comment  In the event of cardiac or respiratory ARREST Do not call a "code blue"   In the event of cardiac or respiratory ARREST Do not perform Intubation, CPR, defibrillation or ACLS   In the event of cardiac or respiratory ARREST Use medication by any route, position, wound  care, and other measures to relive pain and suffering. May use oxygen, suction and manual treatment of airway obstruction as needed for comfort.      12/21/18 1444        Code Status History    Date Active Date Inactive Code Status Order ID Comments User Context   11/20/2016 0857 11/23/2016 2121 Full Code [id number]  Reyne Dumas, MD ED   11/08/2016 1230 11/12/2016 2036 Full Code [id number]  Lorella Nimrod, MD ED   06/11/2015 0415 06/12/2015 1655 Full Code [id number]  Ivor Costa, MD ED   Advance Care Planning Activity       Prognosis:   Guarded  Discharge Planning:  To Be Determined  Care plan was discussed with daughter and RN  Thank you for allowing the Palliative Medicine Team to assist in the care of this patient.   Total Time 40 Prolonged Time Billed no      Greater than 50%  of this time was spent counseling and coordinating care related to the above assessment and plan.  Loistine Chance, MD [id number] Please contact Palliative Medicine Team phone at [phone] for questions and concerns.

## 2018-12-28 NOTE — Progress Notes (Signed)
PT Cancellation Note  Patient Details Name: Leonard Shepard MRN: [id number] DOB: 06-25-1922   Cancelled Treatment:    Reason Eval/Treat Not Completed: Medical issues which prohibited therapy. Pt remains on NRB. Pt does not appear to be medically ready for PT. Will sign off. Please reorder if pt improves. Thanks.   Weston Anna, PT Acute Rehabilitation Services Pager: [phone] Office: [phone]

## 2018-12-28 NOTE — Progress Notes (Signed)
OT Cancellation Note  Patient Details Name: Damarious Goodgion MRN: [id number] DOB: [birth year]   Cancelled Treatment:    Reason Eval/Treat Not Completed: Medical issues which prohibited therapy Medical issues which prohibited therapy. Pt remains on NRB. Pt does not appear to be medically ready for OT. Will sign off. Please reorder if pt improves. Thanks Zalmen Wrightsman, Mickel Baas, OT Acute Rehabilitation Services [phone] [phone]    12/28/2018, 9:23 AM

## 2018-12-28 NOTE — Progress Notes (Signed)
SLP Cancellation Note  Patient Details Name: Leonard Shepard MRN: [id number] DOB: 1923-01-04   Cancelled treatment:       Reason Eval/Treat Not Completed: (pt on NRB) Note plan for possible comfort care if pt does not improve medically.   Luanna Salk, MS [facility] SLP Acute Rehab Services Pager [phone] Office [phone]    Macario Golds 12/28/2018, 12:33 PM

## 2018-12-28 NOTE — [place] Notes (Signed)
Notified by bedside RN in regards to patient's daughter having questions about plan of care and leaving for hospice tomorrow. Daughter's and family wishes that the son is able to visit patient. 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 Patient's daughter did not think that the health care team was still suspecting Covid-19. Precautions are still be utilized because of patient's symptoms and living with son that tested positive recently. Discussed with daughter that hospice locations are being looked at. Also, discussed that the health care team is providing the safest method in getting the patient to a hospice because of the precautions in place. Wells Guiles is paging MD [place] in regards to patient's daughter having questions and some confusion about plan of care.

## 2018-12-29 NOTE — Progress Notes (Signed)
Daily Progress Note   Patient Name: Leonard Shepard       Date: 12/29/2018 DOB: Jan 02, 1923  Age: 85 y.o. MRN#: [id number] Attending Physician: Marcell Anger* Primary Care Physician: Hennie Duos, MD Admit Date: 12/21/2018  Reason for Consultation/Follow-up: Establishing goals of care  Subjective: Patient's restraints have been discontinued this am, discussed with bedside RN.  Discussed with daughter Ivin Booty over the phone, have also corresponded with hospice liaison, appreciate their assistance.        Length of Stay: 8  Current Medications: Scheduled Meds:  . Chlorhexidine Gluconate Cloth  6 each Topical Daily  . hydrALAZINE  25 mg Oral Q8H  . influenza vaccine adjuvanted  0.5 mL Intramuscular Tomorrow-1000  . isosorbide mononitrate  60 mg Oral BID  . levothyroxine  75 mcg Oral Daily  . mouth rinse  15 mL Mouth Rinse BID  . metoprolol succinate  50 mg Oral Daily  . QUEtiapine  25 mg Oral QHS  . sertraline  50 mg Oral Daily    Continuous Infusions: . dextrose 50 mL/hr at 12/29/18 0900    PRN Meds: acetaminophen **OR** acetaminophen, albuterol, glycopyrrolate, haloperidol lactate, HYDROcodone-acetaminophen, hydrocortisone, HYDROmorphone (DILAUDID) injection, labetalol, Melatonin, nitroGLYCERIN, ondansetron, promethazine, Resource ThickenUp Clear  Physical Exam         Elderly gentleman resting in bed, restraints are off, doesn't appear to be in distress, still on NRB mask. Regular efforts of respiration.    Vital Signs: BP (!) 142/54   Pulse (!) 58   Temp 98 F (36.7 C) (Axillary)   Resp 20   Ht 6' (1.829 m)   Wt 86.7 kg   SpO2 98%   BMI 25.92 kg/m  SpO2: SpO2: 98 % O2 Device: O2 Device: NRB O2 Flow Rate: O2 Flow Rate (L/min): 15 L/min  Intake/output  summary:   Intake/Output Summary (Last 24 hours) at 12/29/2018 1003 Last data filed at 12/29/2018 0900 Gross per 24 hour  Intake 1315.8 ml  Output 465 ml  Net 850.8 ml   LBM: Last BM Date: 12/27/18 Baseline Weight: Weight: 93.4 kg Most recent weight: Weight: 86.7 kg       Palliative Assessment/Data:      Patient Active Problem List   Diagnosis Date Noted  . Generalized pain   . Agitation   . Palliative care by specialist   .  Goals of care, counseling/discussion   . Dying care   . Supratherapeutic INR 12/09/2016  . Hypoxia 12/09/2016  . HX: anticoagulation   . Anemia due to acute blood loss   . Diverticulitis of colon with bleeding   . Coagulopathy ([place])   . Lower GI bleed 11/20/2016  . Dysphasia 11/16/2016  . Acute renal failure superimposed on stage 4 chronic kidney disease ([place]) 11/16/2016  . Adrenal insufficiency due to steroid withdrawal ([place]) 11/16/2016  . Hypertension 11/16/2016  . Abdominal aortic atherosclerosis ([place]) 11/10/2016  . Oropharyngeal dysphagia 11/10/2016  . Hemoptysis   . Chronic anticoagulation   . S/P coronary artery stent placement   . Acquired hypothyroidism   . History of adrenal insufficiency   . Postinflammatory pulmonary fibrosis ([place])   . H/O unilateral nephrectomy   . Hx pulmonary embolism   . Aspiration pneumonia ([place]) 11/08/2016  . Orthostasis 06/11/2015  . Sepsis ([place]) 06/11/2015  . Nausea & vomiting 06/11/2015  . Abdominal pain 06/11/2015  . HLD (hyperlipidemia)   . GERD (gastroesophageal reflux disease)   . CAD (coronary artery disease)   . Chronic pulmonary embolism ([place])   . BPH (benign prostatic hyperplasia)   . Chronic renal insufficiency, stage III (moderate)   . COPD (chronic obstructive pulmonary disease) ([place])   . Depression     Palliative Care Assessment & Plan   Patient Profile: 85 year old male with recurrent aspiration pneumonias admitted with hypoxic respiratory failure related to aspiration pneumonia   Assessment: Patient Active Problem List   Diagnosis Date Noted  . Generalized pain   . Agitation   . Palliative care by specialist   . Goals of care, counseling/discussion   . Dying care   . Supratherapeutic INR 12/09/2016  . Hypoxia 12/09/2016  . HX: anticoagulation   . Anemia due to acute blood loss   . Diverticulitis of colon with bleeding   . Coagulopathy ([place])   . Lower GI bleed 11/20/2016  . Dysphasia 11/16/2016  . Acute renal failure superimposed on stage 4 chronic kidney disease ([place]) 11/16/2016  . Adrenal insufficiency due to steroid withdrawal ([place]) 11/16/2016  . Hypertension 11/16/2016  . Abdominal aortic atherosclerosis ([place]) 11/10/2016  . Oropharyngeal dysphagia 11/10/2016  . Hemoptysis   . Chronic anticoagulation   . S/P coronary artery stent placement   . Acquired hypothyroidism   . History of adrenal insufficiency   . Postinflammatory pulmonary fibrosis ([place])   . H/O unilateral nephrectomy   . Hx pulmonary embolism   . Aspiration pneumonia ([place]) 11/08/2016  . Orthostasis 06/11/2015  . Sepsis ([place]) 06/11/2015  . Nausea & vomiting 06/11/2015  . Abdominal pain 06/11/2015  . HLD (hyperlipidemia)   . GERD (gastroesophageal reflux disease)   . CAD (coronary artery disease)   . Chronic pulmonary embolism ([place])   . BPH (benign prostatic hyperplasia)   . Chronic renal insufficiency, stage III (moderate)   . COPD (chronic obstructive pulmonary disease) ([place])   . Depression    Recommendations/Plan:  continue Dilaudid and Haldol PRN Ok to transfer to residential hospice.    Code Status:    Code Status Orders  (From admission, onward)         Start     Ordered   12/21/18 1445  Do not attempt resuscitation (DNR)  Continuous    Question Answer Comment  In the event of cardiac or respiratory ARREST Do not call a "code blue"   In the event of cardiac or respiratory ARREST Do not perform Intubation, CPR, defibrillation  or ACLS   In the event of cardiac or  respiratory ARREST Use medication by any route, position, wound care, and other measures to relive pain and suffering. May use oxygen, suction and manual treatment of airway obstruction as needed for comfort.      12/21/18 1444        Code Status History    Date Active Date Inactive Code Status Order ID Comments User Context   11/20/2016 0857 11/23/2016 2121 Full Code [id number]  Reyne Dumas, MD ED   11/08/2016 1230 11/12/2016 2036 Full Code [id number]  Lorella Nimrod, MD ED   06/11/2015 0415 06/12/2015 1655 Full Code [id number]  Ivor Costa, MD ED   Advance Care Planning Activity       Prognosis:  Less than 2 weeks.   Discharge Planning:  [place] in [place].   Care plan was discussed with daughter and RN Also discussed with hospice liaison.  Thank you for allowing the Palliative Medicine Team to assist in the care of this patient.   Total Time 35 Prolonged Time Billed no      Greater than 50%  of this time was spent counseling and coordinating care related to the above assessment and plan.  Loistine Chance, MD [id number] Please contact Palliative Medicine Team phone at [phone] for questions and concerns.

## 2018-12-29 NOTE — Discharge Summary (Signed)
Physician Discharge Summary  Leonard Shepard:[id number] DOB: Nov 07, 1922 DOA: 12/21/2018  PCP: Leonard Duos, MD  Admit date: 12/21/2018 Discharge date: 12/29/2018  Admitted From: Inpatient Disposition: hospice  Recommendations for Outpatient Follow-up:  1. Pt transferring to hospice for end-of-life care  Home Health:No Equipment/Devices:none  Discharge Condition:Serious CODE STATUS:DNR Diet recommendation: comfort measures  Brief/Interim Summary: Leonard Shepard a 85 y.o.malewith medical history significant forCAD, pulmonary fibrosis on home O2 at 3L/min chronic PE no longer on coumadin due to severe GI bleed in 2018, HTN, HLD, solitary kidney s/p R nephrectomy, CKD stage IV who presents to the [place] of chills, projectile vomiting, worsening shortness of breath,with sats in the 60s. Patient has history of aspiration pneumonia multiple times in the past. He is usually independent, alert and oriented at baseline, ambulates of the with help of walker and lives with his son. He son tested positive for Covid-19 about 10 days ago but his son says that he isolated himself pretty well. Covid-19 test negative twice. CT chest showed right-sided and bibasilar pneumonia on the background of his interstitial lung disease. Patient started on IV antibiotics for aspiration pneumonia. Respiratory status has not improved significantly. Palliative care also following for goals of care. Family slowly leaning towards comfort care but atill remian  undecided  Hospital course: This is unfortunate sent 85 year old white male presented to the hospital as above.  He was admitted to the ICU secondary to acute on chronic hypoxic respiratory failure aspiration pneumonia and suspicion for Covid.  Patient was tested for Covid x2 and was negative we will does have a background of son who was diagnosed with Covid who he lives with.  Patient's was treated for aspiration pneumonia with no  improvement he continues to do worse.  Family met with palliative care and discussed end-of-life care and comfort measures.  They elected to pursue comfort measures with transition to hospice care for end-of-life care.  As noted they did meet with palliative care who felt patient's life expectancy was days to <2 weeks.  While in the hospital patient was treated for aspiration pneumonia with antibiotics as noted, he had elevated troponin cardiology was consulted it was felt was secondary to demand ischemia, he also was noted to have hypertensive urgency although blood pressure has been trending down given end-of-life..  Visit history of adrenal sufficiency initially his Florinef and steroids were held secondary to hypertension with end-of-life care I will defer to hospice regarding continuation of these medications.    Discharge Diagnoses:  Principal Problem:   Sepsis ([place]) Active Problems:   Chronic pulmonary embolism (HCC)   Aspiration pneumonia (HCC)   Acquired hypothyroidism   History of adrenal insufficiency   Postinflammatory pulmonary fibrosis (HCC)   H/O unilateral nephrectomy   Acute renal failure superimposed on stage 4 chronic kidney disease (HCC)   Generalized pain   Agitation   Palliative care by specialist   Goals of care, counseling/discussion   Dying care    Discharge Instructions  Discharge Instructions    Diet - low sodium heart healthy   Complete by: As directed    Increase activity slowly   Complete by: As directed      Allergies as of 12/29/2018      Reactions   Aclidinium Bromide Itching, Other (See Comments)   Throat irritation      Medication List    STOP taking these medications   amLODipine 5 MG tablet Commonly known as: NORVASC   Anusol-HC 2.5 % rectal cream Generic drug:  hydrocortisone   aspirin 81 MG chewable tablet   diphenhydramine-acetaminophen 25-500 MG Tabs tablet Commonly known as: TYLENOL PM   docusate sodium 100 MG  capsule Commonly known as: COLACE   fludrocortisone 0.1 MG tablet Commonly known as: FLORINEF   fluticasone 50 MCG/ACT nasal spray Commonly known as: FLONASE   guaifenesin 100 MG/5ML syrup Commonly known as: ROBITUSSIN   HYDROcodone-acetaminophen 5-325 MG tablet Commonly known as: NORCO/VICODIN   hydrocortisone 10 MG tablet Commonly known as: CORTEF   isosorbide mononitrate 60 MG 24 hr tablet Commonly known as: IMDUR   levothyroxine 75 MCG tablet Commonly known as: SYNTHROID   metoprolol succinate 25 MG 24 hr tablet Commonly known as: TOPROL-XL   multivitamin with minerals Tabs tablet   nitroGLYCERIN 0.4 MG SL tablet Commonly known as: NITROSTAT   ondansetron 4 MG tablet Commonly known as: ZOFRAN   OXYGEN   polyvinyl alcohol 1.4 % ophthalmic solution Commonly known as: LIQUIFILM TEARS   pravastatin 20 MG tablet Commonly known as: PRAVACHOL   psyllium 0.52 g capsule Commonly known as: REGULOID   sertraline 50 MG tablet Commonly known as: ZOLOFT   SLOW-MAG PO   trolamine salicylate 10 % cream Commonly known as: ASPERCREME       Allergies  Allergen Reactions  . Aclidinium Bromide Itching and Other (See Comments)    Throat irritation    Consultations:  Cardiology, palliative care   Procedures/Studies: Ct Abdomen Pelvis Wo Contrast  Result Date: 12/21/2018 CLINICAL DATA:  Nausea vomiting since yesterday, confusion. Limited evaluation due to patient condition. EXAM: CT ABDOMEN AND PELVIS WITHOUT CONTRAST TECHNIQUE: Multidetector CT imaging of the abdomen and pelvis was performed following the standard protocol without IV contrast. COMPARISON:  11/08/2016 FINDINGS: Lower chest: Superimposed airspace disease is present upon a background of interstitial lung disease. To a lesser extent similar changes are seen in the medial left lung base. No signs of pleural effusion. Extensive coronary artery calcifications and evidence of aortic atherosclerosis with  stable dilation of the descending thoracic aorta to 3.6 cm. Heart size is enlarged without pericardial effusion. Central pulmonary vasculature is engorged at 3.8 cm. Hepatobiliary: Lobular hepatic contours. No signs of focal lesion. Post cholecystectomy. No signs of biliary ductal dilation. Pancreas: Unremarkable. No pancreatic ductal dilatation or surrounding inflammatory changes. Spleen: Normal in size without focal abnormality. Adrenals/Urinary Tract: Adrenal glands are normal. Post right nephrectomy. Renal cortical scarring noted on the left. No signs of left-sided hydronephrosis or perinephric fluid. Stomach/Bowel: No signs of bowel obstruction. Large volume stool is however present in the rectum but with similar appearance to prior exam. Colonic diverticulosis. Large duodenal diverticulum unchanged. Vascular/Lymphatic: Marked vascular disease with moderate to marked dilation of the infrarenal abdominal aorta with fusiform morphology measuring 4.4 x 4.3 cm compared to 3.8 x 3.8 cm in 2018. No periaortic stranding or hematoma. No signs of adenopathy in the retroperitoneum or in the upper abdomen. No signs of pelvic lymphadenopathy. Reproductive: Foley catheter in situ, circumferential thickening of the urinary bladder, nonspecific finding. Other: No signs of free air. No ascites. Musculoskeletal: Extensive spinal degenerative changes are similar to the previous exam. Skis be no acute bone finding or destructive bone process. IMPRESSION: Pneumonia superimposed on chronic changes of interstitial lung disease. Aspiration is also considered given history of vomiting. Marked vascular disease with stable dilation of the descending thoracic aorta to 3.6 cm. Infrarenal abdominal aortic aneurysm measuring 4.4 x 4.3 cm compared to 3.8 x 3.8 cm in 2018. Recommend followup by ultrasound in 1 year.  This recommendation follows ACR consensus guidelines: White Paper of the ACR Incidental Findings Committee II on Vascular  Findings. J Am Coll Radiol 2013; 10:789-794. Aortic aneurysm NOS (ICD10-I71.9) Coronary artery disease and signs of pulmonary arterial hypertension. Aortic Atherosclerosis (ICD10-I70.0). Electronically Signed   By: Zetta Bills M.D.   On: 12/21/2018 15:32   Dg Chest Port 1 View  Result Date: 12/28/2018 CLINICAL DATA:  Pneumonia. EXAM: PORTABLE CHEST 1 VIEW COMPARISON:  December 25, 2018. FINDINGS: Stable cardiomegaly. No pneumothorax or pleural effusion is noted. Stable bibasilar opacities are noted concerning for edema or atelectasis or infiltrate. Bony thorax unremarkable. IMPRESSION: Stable bibasilar opacities as described above. Electronically Signed   By: Marijo Conception M.D.   On: 12/28/2018 07:37   Dg Chest Port 1 View  Result Date: 12/25/2018 CLINICAL DATA:  Dyspnea EXAM: PORTABLE CHEST 1 VIEW COMPARISON:  Chest radiograph from one day prior. FINDINGS: Stable cardiomediastinal silhouette with mild cardiomegaly. No pneumothorax. No pleural effusion. Hazy and reticular opacities throughout the mid to lower lungs bilaterally, not significantly changed. IMPRESSION: No appreciable change in hazy and reticular opacities throughout the mid to lower lungs bilaterally. Stable mild cardiomegaly. Findings could reflect any of pulmonary edema, atelectasis, pneumonia and/or chronic interstitial lung disease. Electronically Signed   By: Ilona Sorrel M.D.   On: 12/25/2018 11:48   Dg Chest Port 1 View  Result Date: 12/24/2018 CLINICAL DATA:  Vomiting, shortness of breath EXAM: PORTABLE CHEST 1 VIEW COMPARISON:  12/21/2018 FINDINGS: Stable cardiomegaly. Slightly improved aeration of the right upper lobe compared to previous chest x-ray. Patchy bibasilar opacities are similar to slightly progressed from prior. No large pleural fluid collection. No pneumothorax. IMPRESSION: 1. Patchy bibasilar opacities are similar to slightly progressed from prior. 2. Slightly improved aeration of the right upper lobe compared  to prior. 3. Stable cardiomegaly. Electronically Signed   By: Davina Poke M.D.   On: 12/24/2018 14:03   Dg Chest Port 1 View  Result Date: 12/21/2018 CLINICAL DATA:  Cough and shortness of breath. Negative COVID-19 test. EXAM: PORTABLE CHEST 1 VIEW COMPARISON:  PA and lateral chest 06/10/2015, 11/08/2016 and 11/10/2016. FINDINGS: There is right upper lobe and bibasilar airspace disease. No pneumothorax. There are likely small bilateral pleural effusions. Cardiomegaly and atherosclerosis are identified. No acute or focal bony abnormality. IMPRESSION: Right upper lobe and bibasilar airspace disease most worrisome for pneumonia. Cardiomegaly. Atherosclerosis. Electronically Signed   By: Inge Rise M.D.   On: 12/21/2018 11:38   Dg Abd Portable 1 View  Result Date: 12/21/2018 CLINICAL DATA:  Cough, shortness of breath and renal failure. EXAM: PORTABLE ABDOMEN - 1 VIEW COMPARISON:  CT abdomen and pelvis 11/08/2016. FINDINGS: The bowel gas pattern is normal. Moderately large stool ball in the rectosigmoid colon noted. No radio-opaque calculi or other significant radiographic abnormality are seen. The patient is status post cholecystectomy. Multilevel lumbar spondylosis is seen. IMPRESSION: No acute finding. Moderately large stool ball in the rectosigmoid colon. Electronically Signed   By: Inge Rise M.D.   On: 12/21/2018 11:39       Subjective: Patient remains confused, doing poorly  Discharge Exam: Vitals:   12/29/18 0800 12/29/18 0900  BP: (!) 115/50 (!) 142/54  Pulse: (!) 52 (!) 58  Resp: 19 20  Temp: 98 F (36.7 C)   SpO2: 97% 98%   Vitals:   12/29/18 0700 12/29/18 0730 12/29/18 0800 12/29/18 0900  BP: (!) 85/36 (!) 82/35 (!) 115/50 (!) 142/54  Pulse: (!) 49 (!) 51 Marland Kitchen)  52 (!) 58  Resp: _0 Temp:   98 F (36.7 C)   TempSrc:   Axillary   SpO2: 100% 95% 97% 98%  Weight:      Height:        General: Pt is confused, unable to interact  meaningfully Cardiovascular: RRR, S1/S2 +, no rubs, no gallops Respiratory: Rhonchi bilaterally, trace wheezing Abdominal: Soft, NT clinical exam, ND, bowel sounds + Extremities: no edema, no cyanosis    The results of significant diagnostics from this hospitalization (including imaging, microbiology, ancillary and laboratory) are listed below for reference.     Microbiology: Recent Results (from the past 240 hour(s))  Urine culture     Status: None   Collection Time: 12/21/18  9:16 AM   Specimen: Urine, Catheterized  Result Value Ref Range Status   Specimen Description   Final    URINE, CATHETERIZED Performed at [facility] [address], [place], [place] [address]    Special Requests   Final    NONE Performed at [facility], [place] [address] [place], [place] [address]    Culture   Final    NO GROWTH Performed at [facility], [place] [address], [place], [place] [address]    Report Status 12/22/2018 FINAL  Final  Blood Culture (routine x 2)     Status: None   Collection Time: 12/21/18  9:30 AM   Specimen: BLOOD  Result Value Ref Range Status   Specimen Description   Final    BLOOD RIGHT ANTECUBITAL Performed at [facility] [address], [place], [place] [address]    Special Requests   Final    BOTTLES DRAWN AEROBIC AND ANAEROBIC Blood Culture adequate volume Performed at [facility] [address], [place], [place] [address]    Culture   Final    NO GROWTH 5 DAYS Performed at [facility], [place] [address] [place], [place] [address]    Report Status 12/26/2018 FINAL  Final  SARS Coronavirus 2 by RT PCR (hospital order, performed in [facility] hospital lab) Nasopharyngeal Nasopharyngeal Swab     Status: None   Collection Time: 12/21/18  9:46 AM   Specimen: Nasopharyngeal Swab  Result Value Ref Range Status   SARS Coronavirus 2 NEGATIVE NEGATIVE Final    Comment: (NOTE) If  result is NEGATIVE SARS-CoV-2 target nucleic acids are NOT DETECTED. The SARS-CoV-2 RNA is generally detectable in upper and lower  respiratory specimens during the acute phase of infection. The lowest  concentration of SARS-CoV-2 viral copies this assay can detect is 250  copies / mL. A negative result does not preclude SARS-CoV-2 infection  and should not be used as the sole basis for treatment or other  patient management decisions.  A negative result may occur with  improper specimen collection / handling, submission of specimen other  than nasopharyngeal swab, presence of viral mutation(s) within the  areas targeted by this assay, and inadequate number of viral copies  (<250 copies / mL). A negative result must be combined with clinical  observations, patient history, and epidemiological information. If result is POSITIVE SARS-CoV-2 target nucleic acids are DETECTED. The SARS-CoV-2 RNA is generally detectable in upper and lower  respiratory specimens dur ing the acute phase of infection.  Positive  results are indicative of active infection with SARS-CoV-2.  Clinical  correlation with patient history and other diagnostic information is  necessary to determine patient  infection status.  Positive results do  not rule out bacterial infection or co-infection with other viruses. If result is PRESUMPTIVE POSTIVE SARS-CoV-2 nucleic acids MAY BE PRESENT.   A presumptive positive result was obtained on the submitted specimen  and confirmed on repeat testing.  While 2019 novel coronavirus  (SARS-CoV-2) nucleic acids may be present in the submitted sample  additional confirmatory testing may be necessary for epidemiological  and / or clinical management purposes  to differentiate between  SARS-CoV-2 and other Sarbecovirus currently known to infect humans.  If clinically indicated additional testing with an alternate test  methodology [phone]) is advised. The SARS-CoV-2 RNA is generally   detectable in upper and lower respiratory sp ecimens during the acute  phase of infection. The expected result is Negative. Fact Sheet for Patients:  StrictlyIdeas.no Fact Sheet for Healthcare Providers: BankingDealers.co.za This test is not yet approved or cleared by the [place] FDA and has been authorized for detection and/or diagnosis of SARS-CoV-2 by FDA under an Emergency Use Authorization (EUA).  This EUA will remain in effect (meaning this test can be used) for the duration of the COVID-19 declaration under Section 564(b)(1) of the Act, 21 U.S.C. section 360bbb-3(b)(1), unless the authorization is terminated or revoked sooner. Performed at [facility], [place] [address] [place], [place] [address]   Blood Culture (routine x 2)     Status: None   Collection Time: 12/21/18 10:00 AM   Specimen: BLOOD  Result Value Ref Range Status   Specimen Description   Final    BLOOD LEFT WRIST Performed at [facility] [address], [place], [place] [address]    Special Requests   Final    BOTTLES DRAWN AEROBIC AND ANAEROBIC Blood Culture adequate volume Performed at [facility] [address] [place], [place] [address]    Culture   Final    NO GROWTH 5 DAYS Performed at [facility], [place] [address], [place], [place] [address]    Report Status 12/26/2018 FINAL  Final  MRSA PCR Screening     Status: None   Collection Time: 12/21/18  5:00 PM   Specimen: Nasopharyngeal Swab  Result Value Ref Range Status   MRSA by PCR NEGATIVE NEGATIVE Final    Comment:        The GeneXpert MRSA Assay (FDA approved for NASAL specimens only), is one component of a comprehensive MRSA colonization surveillance program. It is not intended to diagnose MRSA infection nor to guide or monitor treatment for MRSA infections. Performed at [facility], [place] [address] [place], [place] [address]   SARS CORONAVIRUS 2 (TAT 6-24 HRS) Nasopharyngeal Nasopharyngeal Swab     Status: None   Collection Time: 12/21/18  5:46 PM   Specimen: Nasopharyngeal Swab  Result Value Ref Range Status   SARS Coronavirus 2 NEGATIVE NEGATIVE Final    Comment: (NOTE) SARS-CoV-2 target nucleic acids are NOT DETECTED. The SARS-CoV-2 RNA is generally detectable in upper and lower respiratory specimens during the acute phase of infection. Negative results do not preclude SARS-CoV-2 infection, do not rule out co-infections with other pathogens, and should not be used as the sole basis for treatment or other patient management decisions. Negative results must be combined with clinical observations, patient history, and epidemiological information. The expected result is Negative. Fact Sheet for Patients: SugarRoll.be Fact Sheet for Healthcare Providers: https://www.woods-mathews.com/ This test is not yet approved or cleared by the [place] FDA and  has been authorized for detection and/or diagnosis of SARS-CoV-2 by FDA under an Emergency Use Authorization (EUA). This EUA will remain  in effect (meaning this test can be used) for the duration of the COVID-19 declaration under Section 56 4(b)(1) of the Act, 21 U.S.C. section 360bbb-3(b)(1), unless the authorization is terminated or revoked sooner. Performed at [facility], [place] [address], [place], [place] [address]      Labs: BNP (last 3 results) Recent Labs    12/21/18 0930  BNP 944.9*   Basic Metabolic Panel: Recent Labs  Lab 12/24/18 0203 12/25/18 1116 12/26/18 0257 12/27/18 0233 12/28/18 0150  NA 143 143 143 143 140  K 3.9 4.7 3.6 3.1* 3.4*  CL 113* 111 108 108 105  CO2 20* _0 GLUCOSE 111* 100* 99 97 105*  BUN 55* 47* 48* 43* 43*  CREATININE 2.35* 2.19* 2.42* 2.25* 2.35*  CALCIUM 8.2* 8.4* 8.6* 8.5* 8.5*   Liver Function Tests: No results  for input(s): AST, ALT, ALKPHOS, BILITOT, PROT, ALBUMIN in the last 168 hours. No results for input(s): LIPASE, AMYLASE in the last 168 hours. No results for input(s): AMMONIA in the last 168 hours. CBC: Recent Labs  Lab 12/24/18 0203 12/25/18 1116 12/26/18 0619 12/27/18 0233 12/28/18 0150  WBC 12.3* 9.7 9.1 10.5 9.0  NEUTROABS 10.3* 7.0 6.0 7.0 5.6  HGB 10.5* 10.8* 12.0* 11.7* 11.7*  HCT 34.4* 33.9* 38.9* 37.5* 37.8*  MCV 97.2 93.9 95.6 95.4 95.7  PLT 195 PLATELET CLUMPS NOTED ON SMEAR, UNABLE TO ESTIMATE 230 239 258   Cardiac Enzymes: No results for input(s): CKTOTAL, CKMB, CKMBINDEX, TROPONINI in the last 168 hours. BNP: Invalid input(s): POCBNP CBG: Recent Labs  Lab 12/27/18 2022 12/28/18 0034 12/28/18 0446 12/28/18 0725 12/28/18 1125  GLUCAP 99 111* 105* 106* 120*   D-Dimer No results for input(s): DDIMER in the last 72 hours. Hgb A1c No results for input(s): HGBA1C in the last 72 hours. Lipid Profile No results for input(s): CHOL, HDL, LDLCALC, TRIG, CHOLHDL, LDLDIRECT in the last 72 hours. Thyroid function studies No results for input(s): TSH, T4TOTAL, T3FREE, THYROIDAB in the last 72 hours.  Invalid input(s): FREET3 Anemia work up No results for input(s): VITAMINB12, FOLATE, FERRITIN, TIBC, IRON, RETICCTPCT in the last 72 hours. Urinalysis    Component Value Date/Time   COLORURINE YELLOW 12/21/2018 0915   APPEARANCEUR CLEAR 12/21/2018 0915   LABSPEC 1.013 12/21/2018 0915   PHURINE 5.0 12/21/2018 0915   GLUCOSEU NEGATIVE 12/21/2018 0915   HGBUR NEGATIVE 12/21/2018 0915   BILIRUBINUR NEGATIVE 12/21/2018 0915   KETONESUR NEGATIVE 12/21/2018 0915   PROTEINUR 100 (A) 12/21/2018 0915   NITRITE NEGATIVE 12/21/2018 0915   LEUKOCYTESUR NEGATIVE 12/21/2018 0915   Sepsis Labs Invalid input(s): PROCALCITONIN,  WBC,  LACTICIDVEN Microbiology Recent Results (from the past 240 hour(s))  Urine culture     Status: None   Collection Time: 12/21/18  9:16 AM    Specimen: Urine, Catheterized  Result Value Ref Range Status   Specimen Description   Final    URINE, CATHETERIZED Performed at [facility], [place] [address], [place], [place] [address]    Special Requests   Final    NONE Performed at [facility], [place] [address], [place], [place] [address]    Culture   Final    NO GROWTH Performed at [facility], [place] [address], [place], [place] [address]    Report Status 12/22/2018 FINAL  Final  Blood Culture (routine x 2)     Status: None   Collection Time: 12/21/18  9:30 AM   Specimen: BLOOD  Result Value Ref Range Status   Specimen Description   Final    BLOOD RIGHT ANTECUBITAL Performed at [facility] [address], [place], [place] [address]    Special Requests   Final    BOTTLES DRAWN AEROBIC AND ANAEROBIC Blood Culture adequate volume Performed at [facility] [address] [place], [place] [address]    Culture   Final    NO GROWTH 5 DAYS Performed at [facility], [place] [address] [place], [place] [address]    Report Status 12/26/2018 FINAL  Final  SARS Coronavirus 2 by RT PCR (hospital order, performed in [facility] hospital lab) Nasopharyngeal Nasopharyngeal Swab     Status: None   Collection Time: 12/21/18  9:46 AM   Specimen: Nasopharyngeal Swab  Result Value Ref Range Status   SARS Coronavirus 2 NEGATIVE NEGATIVE Final    Comment: (NOTE) If result is NEGATIVE SARS-CoV-2 target nucleic acids are NOT DETECTED. The SARS-CoV-2 RNA is generally detectable in upper and lower  respiratory specimens during the acute phase of infection. The lowest  concentration of SARS-CoV-2 viral copies this assay can detect is 250  copies / mL. A negative result does not preclude SARS-CoV-2 infection  and should not be used as the sole basis for treatment or other  patient management decisions.  A negative result may occur with  improper specimen  collection / handling, submission of specimen other  than nasopharyngeal swab, presence of viral mutation(s) within the  areas targeted by this assay, and inadequate number of viral copies  (<250 copies / mL). A negative result must be combined with clinical  observations, patient history, and epidemiological information. If result is POSITIVE SARS-CoV-2 target nucleic acids are DETECTED. The SARS-CoV-2 RNA is generally detectable in upper and lower  respiratory specimens dur ing the acute phase of infection.  Positive  results are indicative of active infection with SARS-CoV-2.  Clinical  correlation with patient history and other diagnostic information is  necessary to determine patient infection status.  Positive results do  not rule out bacterial infection or co-infection with other viruses. If result is PRESUMPTIVE POSTIVE SARS-CoV-2 nucleic acids MAY BE PRESENT.   A presumptive positive result was obtained on the submitted specimen  and confirmed on repeat testing.  While 2019 novel coronavirus  (SARS-CoV-2) nucleic acids may be present in the submitted sample  additional confirmatory testing may be necessary for epidemiological  and / or clinical management purposes  to differentiate between  SARS-CoV-2 and other Sarbecovirus currently known to infect humans.  If clinically indicated additional testing with an alternate test  methodology [id number]) is advised. The SARS-CoV-2 RNA is generally  detectable in upper and lower respiratory sp ecimens during the acute  phase of infection. The expected result is Negative. Fact Sheet for Patients:  StrictlyIdeas.no Fact Sheet for Healthcare Providers: BankingDealers.co.za This test is not yet approved or cleared by the [place] FDA and has been authorized for detection and/or diagnosis of SARS-CoV-2 by FDA under an Emergency Use Authorization (EUA).  This EUA will remain in effect  (meaning this test can be used) for the duration of the COVID-19 declaration under Section 564(b)(1) of the Act, 21 U.S.C. section 360bbb-3(b)(1), unless the authorization is terminated or revoked sooner. Performed at [facility], [place] [address], [place], [place] [address]  Blood Culture (routine x 2)     Status: None   Collection Time: 12/21/18 10:00 AM   Specimen: BLOOD  Result Value Ref Range Status   Specimen Description   Final    BLOOD LEFT WRIST Performed at [facility] [address] [place], [place] [address]    Special Requests   Final    BOTTLES DRAWN AEROBIC AND ANAEROBIC Blood Culture adequate volume Performed at [facility] [address], [place], [place] [address]    Culture   Final    NO GROWTH 5 DAYS Performed at [facility], [place] [address], [place], [place] [address]    Report Status 12/26/2018 FINAL  Final  MRSA PCR Screening     Status: None   Collection Time: 12/21/18  5:00 PM   Specimen: Nasopharyngeal Swab  Result Value Ref Range Status   MRSA by PCR NEGATIVE NEGATIVE Final    Comment:        The GeneXpert MRSA Assay (FDA approved for NASAL specimens only), is one component of a comprehensive MRSA colonization surveillance program. It is not intended to diagnose MRSA infection nor to guide or monitor treatment for MRSA infections. Performed at [facility], [place] [address], [place], [place] [address]   SARS CORONAVIRUS 2 (TAT 6-24 HRS) Nasopharyngeal Nasopharyngeal Swab     Status: None   Collection Time: 12/21/18  5:46 PM   Specimen: Nasopharyngeal Swab  Result Value Ref Range Status   SARS Coronavirus 2 NEGATIVE NEGATIVE Final    Comment: (NOTE) SARS-CoV-2 target nucleic acids are NOT DETECTED. The SARS-CoV-2 RNA is generally detectable in upper and lower respiratory specimens during the acute phase of infection. Negative results do not preclude  SARS-CoV-2 infection, do not rule out co-infections with other pathogens, and should not be used as the sole basis for treatment or other patient management decisions. Negative results must be combined with clinical observations, patient history, and epidemiological information. The expected result is Negative. Fact Sheet for Patients: SugarRoll.be Fact Sheet for Healthcare Providers: https://www.woods-mathews.com/ This test is not yet approved or cleared by the [place] FDA and  has been authorized for detection and/or diagnosis of SARS-CoV-2 by FDA under an Emergency Use Authorization (EUA). This EUA will remain  in effect (meaning this test can be used) for the duration of the COVID-19 declaration under Section 56 4(b)(1) of the Act, 21 U.S.C. section 360bbb-3(b)(1), unless the authorization is terminated or revoked sooner. Performed at [facility], [place] [address], [place], [place] [address]      Time coordinating discharge: Over 30 minutes  SIGNED:   Nicolette Bang, MD  [facility] 12/29/2018, 9:17 AM Pager   If 7PM-7AM, please contact night-coverage [email or web address] Password [email or web address]

## 2018-12-29 NOTE — Progress Notes (Signed)
Discharge   Patient Details Name: Leonard Shepard MRN: [id number] DOB: 08-14-1922   Discharge treatment:       Reason Eval/Treat Not Completed: Other (comment)(pt to dc to hospice, comfort care, will sign off.) Luanna Salk, MS [facility] Acute Rehab Services Pager [phone] Office [phone]   Macario Golds 12/29/2018, 9:35 AM

## 2018-12-29 NOTE — TOC Transition Note (Signed)
Transition of Care [facility]) - CM/SW Discharge Note   Patient Details  Name: Leonard Shepard MRN: [id number] Date of Birth: September 26, 1922  Transition of Care [facility]) CM/SW Contact:  Lynnell Catalan, RN Phone Number:[phone] 12/29/2018, 10:32 AM   Clinical Narrative:     Spoke with daughter Ivin Booty via phone. Choice was offered for Residential hospice facilities and [facility] was chosen. They do have a bed available and daughter is going to sign paperwork there at 1:30pm. [facility] liaison asks for [facility] transport to be set up for 2:00pm. This CM set up [facility] transport for 2pm. Yellow DNR placed on shadow chart and MD alerted to fill out.        Readmission Risk Interventions Readmission Risk Prevention Plan 12/27/2018  Transportation Screening Complete  PCP or Specialist Appt within 3-5 Days Not Complete  Not Complete comments not yet ready to dc  HRI or Page Complete  Social Work Consult for [place] Planning/Counseling Complete  Palliative Care Screening Complete  Medication Review Press photographer) Complete  Some recent data might be hidden

## 2018-12-29 NOTE — Progress Notes (Signed)
   12/29/18 1100  Clinical Encounter Type  Visited With Family  Visit Type Follow-up;Psychological support;Spiritual support;Patient actively dying  Referral From Nurse  Consult/Referral To [place] (Comment);Grief support  Stress Factors  Family Stress Factors Loss   I visited with the patient's daughter as a follow up. No needs are present at this time.    Chaplain Shanon Ace M.Div., [facility]

## 2019-01-02 DEATH — deceased
